# Patient Record
Sex: Female | Born: 1950
Health system: Southern US, Community
[De-identification: ages and names within clinical notes are randomized; demographics above are authoritative.]

## PROBLEM LIST (undated history)

## (undated) ENCOUNTER — Inpatient Hospital Stay (HOSPITAL_COMMUNITY): Payer: 59

## (undated) DIAGNOSIS — N762 Acute vulvitis: Secondary | ICD-10-CM

## (undated) DIAGNOSIS — E039 Hypothyroidism, unspecified: Secondary | ICD-10-CM

## (undated) DIAGNOSIS — G4733 Obstructive sleep apnea (adult) (pediatric): Secondary | ICD-10-CM

## (undated) DIAGNOSIS — R7989 Other specified abnormal findings of blood chemistry: Secondary | ICD-10-CM

## (undated) DIAGNOSIS — K635 Polyp of colon: Secondary | ICD-10-CM

## (undated) DIAGNOSIS — C439 Malignant melanoma of skin, unspecified: Secondary | ICD-10-CM

## (undated) DIAGNOSIS — R011 Cardiac murmur, unspecified: Secondary | ICD-10-CM

## (undated) DIAGNOSIS — D72829 Elevated white blood cell count, unspecified: Secondary | ICD-10-CM

## (undated) DIAGNOSIS — I10 Essential (primary) hypertension: Secondary | ICD-10-CM

## (undated) DIAGNOSIS — R945 Abnormal results of liver function studies: Secondary | ICD-10-CM

## (undated) DIAGNOSIS — G459 Transient cerebral ischemic attack, unspecified: Secondary | ICD-10-CM

## (undated) DIAGNOSIS — D539 Nutritional anemia, unspecified: Secondary | ICD-10-CM

## (undated) DIAGNOSIS — E7800 Pure hypercholesterolemia, unspecified: Secondary | ICD-10-CM

## (undated) DIAGNOSIS — K219 Gastro-esophageal reflux disease without esophagitis: Secondary | ICD-10-CM

## (undated) HISTORY — DX: Hypothyroidism, unspecified: E03.9

## (undated) HISTORY — PX: COLONOSCOPY: SHX174

## (undated) HISTORY — DX: Gastro-esophageal reflux disease without esophagitis: K21.9

## (undated) HISTORY — PX: OTHER SURGICAL HISTORY: SHX169

## (undated) HISTORY — DX: Essential (primary) hypertension: I10

## (undated) HISTORY — PX: MELANOMA EXCISION: SHX5266

## (undated) HISTORY — DX: Pure hypercholesterolemia, unspecified: E78.00

## (undated) HISTORY — DX: Elevated white blood cell count, unspecified: D72.829

## (undated) HISTORY — DX: Transient cerebral ischemic attack, unspecified: G45.9

## (undated) HISTORY — DX: Malignant melanoma of skin, unspecified: C43.9

## (undated) HISTORY — DX: Abnormal results of liver function studies: R94.5

## (undated) HISTORY — DX: Other specified abnormal findings of blood chemistry: R79.89

## (undated) HISTORY — PX: HEMORRHOID SURGERY: SHX153

## (undated) HISTORY — DX: Polyp of colon: K63.5

## (undated) HISTORY — DX: Acute vulvitis: N76.2

## (undated) HISTORY — DX: Nutritional anemia, unspecified: D53.9

## (undated) HISTORY — PX: BREAST SURGERY: SHX581

## (undated) HISTORY — DX: Obstructive sleep apnea (adult) (pediatric): G47.33

---

## 1997-12-21 ENCOUNTER — Ambulatory Visit (HOSPITAL_BASED_OUTPATIENT_CLINIC_OR_DEPARTMENT_OTHER): Admission: RE | Admit: 1997-12-21 | Discharge: 1997-12-21 | Payer: Self-pay | Admitting: General Surgery

## 1998-03-09 ENCOUNTER — Emergency Department (HOSPITAL_COMMUNITY): Admission: EM | Admit: 1998-03-09 | Discharge: 1998-03-09 | Payer: Self-pay | Admitting: Emergency Medicine

## 2000-01-19 ENCOUNTER — Ambulatory Visit: Admission: RE | Admit: 2000-01-19 | Discharge: 2000-01-19 | Payer: Self-pay | Admitting: Family Medicine

## 2000-01-19 ENCOUNTER — Encounter: Payer: Self-pay | Admitting: Family Medicine

## 2000-11-30 ENCOUNTER — Ambulatory Visit (HOSPITAL_COMMUNITY): Admission: RE | Admit: 2000-11-30 | Discharge: 2000-11-30 | Payer: Self-pay | Admitting: Family Medicine

## 2000-11-30 ENCOUNTER — Encounter: Payer: Self-pay | Admitting: Family Medicine

## 2002-02-26 ENCOUNTER — Other Ambulatory Visit: Admission: RE | Admit: 2002-02-26 | Discharge: 2002-02-26 | Payer: Self-pay | Admitting: *Deleted

## 2002-06-07 ENCOUNTER — Emergency Department (HOSPITAL_COMMUNITY): Admission: EM | Admit: 2002-06-07 | Discharge: 2002-06-07 | Payer: Self-pay | Admitting: Emergency Medicine

## 2002-12-23 ENCOUNTER — Encounter: Payer: Self-pay | Admitting: Emergency Medicine

## 2002-12-23 ENCOUNTER — Emergency Department (HOSPITAL_COMMUNITY): Admission: EM | Admit: 2002-12-23 | Discharge: 2002-12-23 | Payer: Self-pay | Admitting: Emergency Medicine

## 2003-12-22 ENCOUNTER — Ambulatory Visit (HOSPITAL_COMMUNITY): Admission: RE | Admit: 2003-12-22 | Discharge: 2003-12-22 | Payer: Self-pay | Admitting: Family Medicine

## 2004-12-26 ENCOUNTER — Ambulatory Visit (HOSPITAL_COMMUNITY): Admission: RE | Admit: 2004-12-26 | Discharge: 2004-12-26 | Payer: Self-pay | Admitting: Family Medicine

## 2004-12-26 ENCOUNTER — Other Ambulatory Visit: Admission: RE | Admit: 2004-12-26 | Discharge: 2004-12-26 | Payer: Self-pay | Admitting: Family Medicine

## 2005-01-02 ENCOUNTER — Encounter: Admission: RE | Admit: 2005-01-02 | Discharge: 2005-04-02 | Payer: Self-pay | Admitting: Family Medicine

## 2005-03-09 ENCOUNTER — Ambulatory Visit (HOSPITAL_COMMUNITY): Admission: RE | Admit: 2005-03-09 | Discharge: 2005-03-09 | Payer: Self-pay | Admitting: Family Medicine

## 2005-03-14 ENCOUNTER — Encounter: Admission: RE | Admit: 2005-03-14 | Discharge: 2005-03-14 | Payer: Self-pay | Admitting: Family Medicine

## 2005-08-07 ENCOUNTER — Ambulatory Visit (HOSPITAL_BASED_OUTPATIENT_CLINIC_OR_DEPARTMENT_OTHER): Admission: RE | Admit: 2005-08-07 | Discharge: 2005-08-07 | Payer: Self-pay | Admitting: Family Medicine

## 2005-08-13 ENCOUNTER — Ambulatory Visit: Payer: Self-pay | Admitting: Internal Medicine

## 2005-08-13 ENCOUNTER — Emergency Department (HOSPITAL_COMMUNITY): Admission: EM | Admit: 2005-08-13 | Discharge: 2005-08-14 | Payer: Self-pay | Admitting: Emergency Medicine

## 2006-07-23 ENCOUNTER — Ambulatory Visit (HOSPITAL_COMMUNITY): Admission: RE | Admit: 2006-07-23 | Discharge: 2006-07-23 | Payer: Self-pay | Admitting: Family Medicine

## 2006-09-06 ENCOUNTER — Other Ambulatory Visit: Admission: RE | Admit: 2006-09-06 | Discharge: 2006-09-06 | Payer: Self-pay | Admitting: Family Medicine

## 2006-11-12 ENCOUNTER — Emergency Department (HOSPITAL_COMMUNITY): Admission: EM | Admit: 2006-11-12 | Discharge: 2006-11-12 | Payer: Self-pay | Admitting: Emergency Medicine

## 2007-05-20 DIAGNOSIS — G459 Transient cerebral ischemic attack, unspecified: Secondary | ICD-10-CM

## 2007-05-20 HISTORY — DX: Transient cerebral ischemic attack, unspecified: G45.9

## 2007-06-10 ENCOUNTER — Encounter: Payer: Self-pay | Admitting: Emergency Medicine

## 2007-06-11 ENCOUNTER — Observation Stay (HOSPITAL_COMMUNITY): Admission: EM | Admit: 2007-06-11 | Discharge: 2007-06-12 | Payer: Self-pay | Admitting: Internal Medicine

## 2007-06-11 ENCOUNTER — Ambulatory Visit: Payer: Self-pay | Admitting: Cardiology

## 2007-06-12 ENCOUNTER — Encounter (INDEPENDENT_AMBULATORY_CARE_PROVIDER_SITE_OTHER): Payer: Self-pay | Admitting: Internal Medicine

## 2007-10-07 ENCOUNTER — Encounter: Admission: RE | Admit: 2007-10-07 | Discharge: 2007-10-07 | Payer: Self-pay | Admitting: Family Medicine

## 2007-11-17 HISTORY — PX: DILATION AND CURETTAGE OF UTERUS: SHX78

## 2007-11-28 ENCOUNTER — Ambulatory Visit (HOSPITAL_COMMUNITY): Admission: RE | Admit: 2007-11-28 | Discharge: 2007-11-28 | Payer: Self-pay | Admitting: Obstetrics and Gynecology

## 2007-11-28 ENCOUNTER — Encounter (INDEPENDENT_AMBULATORY_CARE_PROVIDER_SITE_OTHER): Payer: Self-pay | Admitting: Obstetrics and Gynecology

## 2008-01-14 ENCOUNTER — Emergency Department (HOSPITAL_COMMUNITY): Admission: EM | Admit: 2008-01-14 | Discharge: 2008-01-14 | Payer: Self-pay | Admitting: Emergency Medicine

## 2008-12-03 ENCOUNTER — Ambulatory Visit: Payer: Self-pay | Admitting: Gynecology

## 2008-12-03 ENCOUNTER — Encounter: Payer: Self-pay | Admitting: Gynecology

## 2008-12-03 ENCOUNTER — Other Ambulatory Visit: Admission: RE | Admit: 2008-12-03 | Discharge: 2008-12-03 | Payer: Self-pay | Admitting: Gynecology

## 2009-01-04 ENCOUNTER — Ambulatory Visit: Payer: Self-pay | Admitting: Gynecology

## 2009-01-05 ENCOUNTER — Ambulatory Visit: Payer: Self-pay | Admitting: Gynecology

## 2009-06-16 ENCOUNTER — Encounter: Admission: RE | Admit: 2009-06-16 | Discharge: 2009-09-14 | Payer: Self-pay | Admitting: Family Medicine

## 2009-06-23 ENCOUNTER — Ambulatory Visit: Payer: Self-pay | Admitting: Family Medicine

## 2009-09-24 ENCOUNTER — Encounter: Admission: RE | Admit: 2009-09-24 | Discharge: 2009-09-24 | Payer: Self-pay | Admitting: Family Medicine

## 2010-01-27 ENCOUNTER — Ambulatory Visit (HOSPITAL_COMMUNITY)
Admission: RE | Admit: 2010-01-27 | Discharge: 2010-01-27 | Payer: Self-pay | Source: Home / Self Care | Admitting: Orthopaedic Surgery

## 2010-03-09 ENCOUNTER — Ambulatory Visit (HOSPITAL_BASED_OUTPATIENT_CLINIC_OR_DEPARTMENT_OTHER): Admission: RE | Admit: 2010-03-09 | Discharge: 2010-03-09 | Payer: Self-pay | Admitting: Orthopaedic Surgery

## 2010-03-12 ENCOUNTER — Ambulatory Visit: Payer: Self-pay | Admitting: Internal Medicine

## 2010-05-18 ENCOUNTER — Ambulatory Visit (HOSPITAL_BASED_OUTPATIENT_CLINIC_OR_DEPARTMENT_OTHER): Admission: RE | Admit: 2010-05-18 | Discharge: 2010-05-18 | Payer: Self-pay | Admitting: Orthopaedic Surgery

## 2010-07-13 ENCOUNTER — Ambulatory Visit (HOSPITAL_COMMUNITY): Admission: RE | Admit: 2010-07-13 | Discharge: 2010-07-13 | Payer: Self-pay | Admitting: Gynecology

## 2010-07-13 ENCOUNTER — Ambulatory Visit: Payer: Self-pay | Admitting: Gynecology

## 2010-07-13 ENCOUNTER — Other Ambulatory Visit: Admission: RE | Admit: 2010-07-13 | Discharge: 2010-07-13 | Payer: Self-pay | Admitting: Gynecology

## 2010-07-25 ENCOUNTER — Encounter
Admission: RE | Admit: 2010-07-25 | Discharge: 2010-10-18 | Payer: Self-pay | Source: Home / Self Care | Attending: Family Medicine | Admitting: Family Medicine

## 2010-09-06 ENCOUNTER — Ambulatory Visit: Payer: Self-pay | Admitting: Gynecology

## 2010-10-09 ENCOUNTER — Encounter: Payer: Self-pay | Admitting: Orthopaedic Surgery

## 2010-10-09 ENCOUNTER — Encounter: Payer: Self-pay | Admitting: Orthopedic Surgery

## 2010-10-20 ENCOUNTER — Ambulatory Visit (HOSPITAL_COMMUNITY)
Admission: RE | Admit: 2010-10-20 | Discharge: 2010-10-20 | Disposition: A | Discharge status: Home / Self Care | Payer: 59 | Source: Ambulatory Visit | Attending: Emergency Medicine | Admitting: Emergency Medicine

## 2010-10-20 ENCOUNTER — Other Ambulatory Visit (HOSPITAL_COMMUNITY): Payer: Self-pay | Admitting: Emergency Medicine

## 2010-10-20 ENCOUNTER — Inpatient Hospital Stay (INDEPENDENT_AMBULATORY_CARE_PROVIDER_SITE_OTHER)
Admission: RE | Admit: 2010-10-20 | Discharge: 2010-10-20 | Disposition: A | Discharge status: Home / Self Care | Payer: 59 | Source: Ambulatory Visit | Attending: Emergency Medicine | Admitting: Emergency Medicine

## 2010-10-20 DIAGNOSIS — M549 Dorsalgia, unspecified: Secondary | ICD-10-CM | POA: Insufficient documentation

## 2010-10-20 DIAGNOSIS — W19XXXA Unspecified fall, initial encounter: Secondary | ICD-10-CM

## 2010-10-20 DIAGNOSIS — Z9181 History of falling: Secondary | ICD-10-CM | POA: Insufficient documentation

## 2010-10-20 DIAGNOSIS — S20229A Contusion of unspecified back wall of thorax, initial encounter: Secondary | ICD-10-CM

## 2010-10-25 ENCOUNTER — Emergency Department (HOSPITAL_COMMUNITY): Payer: 59

## 2010-10-25 ENCOUNTER — Emergency Department (HOSPITAL_COMMUNITY)
Admission: EM | Admit: 2010-10-25 | Discharge: 2010-10-25 | Disposition: A | Discharge status: Home / Self Care | Payer: 59 | Attending: Emergency Medicine | Admitting: Emergency Medicine

## 2010-10-25 DIAGNOSIS — Z8673 Personal history of transient ischemic attack (TIA), and cerebral infarction without residual deficits: Secondary | ICD-10-CM | POA: Insufficient documentation

## 2010-10-25 DIAGNOSIS — X58XXXA Exposure to other specified factors, initial encounter: Secondary | ICD-10-CM | POA: Insufficient documentation

## 2010-10-25 DIAGNOSIS — T148XXA Other injury of unspecified body region, initial encounter: Secondary | ICD-10-CM | POA: Insufficient documentation

## 2010-10-25 DIAGNOSIS — E119 Type 2 diabetes mellitus without complications: Secondary | ICD-10-CM | POA: Insufficient documentation

## 2010-10-25 DIAGNOSIS — M549 Dorsalgia, unspecified: Secondary | ICD-10-CM | POA: Insufficient documentation

## 2010-10-25 LAB — GLUCOSE, CAPILLARY: Glucose-Capillary: 229 mg/dL — ABNORMAL HIGH (ref 70–99)

## 2010-12-29 ENCOUNTER — Encounter: Payer: 59 | Admitting: Family Medicine

## 2011-01-12 ENCOUNTER — Encounter (INDEPENDENT_AMBULATORY_CARE_PROVIDER_SITE_OTHER): Payer: 59 | Admitting: Family Medicine

## 2011-01-12 DIAGNOSIS — Z79899 Other long term (current) drug therapy: Secondary | ICD-10-CM

## 2011-01-12 DIAGNOSIS — E039 Hypothyroidism, unspecified: Secondary | ICD-10-CM

## 2011-01-12 DIAGNOSIS — E7800 Pure hypercholesterolemia, unspecified: Secondary | ICD-10-CM

## 2011-01-12 DIAGNOSIS — IMO0001 Reserved for inherently not codable concepts without codable children: Secondary | ICD-10-CM
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
## 2011-01-31 NOTE — Op Note (Signed)
[name], [name]               ACCOUNT NO.:  [id number]   MEDICAL RECORD NO.:  [id number]          PATIENT TYPE:  AMB   LOCATION:  SDC                           FACILITY:  [facility]   PHYSICIAN:  Huel Cote, M.D. DATE OF BIRTH:  June 16, 1951   DATE OF PROCEDURE:  11/28/2007  DATE OF DISCHARGE:                               OPERATIVE REPORT   PREOPERATIVE DIAGNOSES:  1. Postmenopausal bleeding.  2. Probable endometrial polyp.   POSTOPERATIVE DIAGNOSES:  1. Postmenopausal bleeding.  2. Probable endometrial polyp.   PROCEDURE:  Hysteroscopy, polypectomy and endometrial biopsy.   SURGEON:  Huel Cote, M.D.   ANESTHESIA:  LMA and 1% lidocaine cervical block.   FINDINGS:  There is an anterior polyp, atrophic appearing endometrium.  Also noted, the cervix was very distal in the vagina and difficult to  visualize which had been noted in the office making endometrial biopsy  in the office quite difficult.   SPECIMEN:  The polyp and endometrial biopsy were sent to pathology.   ESTIMATED BLOOD LOSS:  Minimal.   URINE OUTPUT:  Fifth mL straight cath prior to procedure.   IV FLUIDS:  Thirteen hundred mL LR.   The uterus and uterine cavity were quite anterior and cervical dilation  was somewhat challenging; however, was accomplished with flexible  dilator and putting tenaculum on the posterior lip of the cervix.   PROCEDURE IN DETAIL:  The patient was taken to the operating room where  LMA anesthesia was obtained without difficulty.  She was then prepped  and draped in normal sterile fashion in the dorsal lithotomy position.  The bladder was drained and a long Peterson speculum was placed within.  However, secondary to difficulty visualizing with the vaginal walls  somewhat redundant the long LEEP retractor was utilized with sidewall  retractors and a upper and lower blade as well.  With this in place the  cervix was better visualized and was grasped and injected with  approximately 10 mL of 1% lidocaine plain for a local block.  The cervix  was very distal in the vagina.  However, it was able to be grasped on  the anterior lip and a flexible dilator introduced into the uterine  cavity.  This was passed without problem.  However, it was difficult to  admit a sound or any of the more rigid dilators secondary to a  significant anteverted flexed uterus with a steep incline.  The  posterior lip was then grasped with a tenaculum and this enabled the  uterine body to be straightened out somewhat and then the dilators were  more able to be introduced easily.  This was carried out up to  approximately 21.  The diagnostic small resectoscope was then able to be  introduced into the cervical os and under direct visualization carried  up to the uterine cavity which was still quite at an anterior slope.  With the uterine cavity visualized an anterior polyp was seen on the  lower anterior fundus and the remainder of the endometrium appeared  atrophic and normal.  There were no submucosal fibroids or other  findings.  The flexible hysteroscopic graspers were introduced through  the port and the polyp grasped and pulled down somewhat to the cervical  os.  Small fragments were removed but the majority of the polyp was too  large to be removed with this grasper.  As it was loosened somewhat and  pulled down to the cervical os it was felt that now it could be grasped  with straight polyp forceps.  Therefore the hysteroscope was removed and  polyp forceps introduced into the uterine cavity.  The polyp was grasped  and removed in its entirety and was approximately 1 cm in length and 0.5  cm wide.  This was sent to pathology and the hysteroscope reintroduced  into the uterus.  Everything appeared clear with no significant active  bleeding.  Just a small amount of oozing where the polyp had been  removed.  There were no signs of any uterine perforation or other  trauma.  At  this point the hysteroscope was discontinued.  An attempt  was made to introduce a small curette to remove obtain endometrial  curettings as the patient was not able to be sampled in the office due  to her difficult body habitus.  The curette was difficult introducing in  and therefore it was switched to the flexible endometrial Pipelle which  was easily introduced and in several passes a small amount of tissue  obtained which was expected with an atrophic appearing endometrium.  Therefore the polyp and endometrial biopsy sample were sent together.  The patient was then awakened.  Her cervix was hemostatic from the  tenaculum site and the retractor LEEP speculum was removed from the  vagina.  The patient was awakened and did well and was taken to the  recovery room.      Huel Cote, M.D.  Electronically Signed     KR/MEDQ  D:  11/28/2007  T:  11/29/2007  Job:  [id number]

## 2011-01-31 NOTE — H&P (Signed)
[name], [name]               ACCOUNT NO.:  [id number]   MEDICAL RECORD NO.:  [id number]          PATIENT TYPE:  EMS   LOCATION:  ED                           FACILITY:  [facility]   PHYSICIAN:  Hollice Espy, M.D.DATE OF BIRTH:  01-Dec-1950   DATE OF ADMISSION:  06/10/2007  DATE OF DISCHARGE:                              HISTORY & PHYSICAL   PRIMARY CARE PHYSICIAN:  [name], M.D.   CHIEF COMPLAINT:  Headache and left forearm numbness.   HISTORY OF PRESENT ILLNESS:  The patient is a 60 year old Carson female  with past medical history of obesity, borderline diabetes, hypertension,  and hyperlipidemia who presents to the emergency room after an episode  of a headache with associated forearm numbness.  She says she has been  previously well.  She gets an occasional headache, but never this sudden  onset where she described it as having some blurry vision followed by  severe pain behind her right eye and going down the right side of her  head.  The episode lasted for about several hours.  Soon after the  headache started, she started having some numbness starting from the  left elbow down to her left hand.  These symptoms lasted for a little  over an hour and she became concerned and came into the emergency room.  In the emergency room when she came in, she was noted to have an  elevated blood pressure of 187/106.  Her heart rate was stable.  She  underwent a CT scan of the head which was unremarkable for any type of  bleed or large infarct.  Her blood work including CBC and BMET was done  which was normal and so she underwent an MRI and the Hospitalists were  called for further evaluation.  Her symptoms resolved again within 1-2  hours from their onset and she is completely back to her normal  baseline.  Currently she is doing well.  She denies any headaches,  vision changes, dysphagia, chest pain, palpitations, shortness of  breath, wheeze, cough, abdominal pain,  hematuria, dysuria, constipation,  diarrhea, focal extremity numbness, weakness, or pain.   REVIEW OF SYSTEMS:  Otherwise negative.   PAST MEDICAL HISTORY:  Borderline hypertension of which she is no longer  on any medication for, borderline diabetes of which she is no longer on  any medication for, hyperlipidemia, obesity, previous tobacco abuse,  quit 2 years ago, history of oral cancer, status post resection and  treatment.  Hypothyroidism.   MEDICATIONS:  She is on Simvastatin, Zetia, Synthroid, and Septra.   ALLERGIES:  No known drug allergies.   SOCIAL HISTORY:  She denies any current tobacco, heavy alcohol, or drug  use.   FAMILY HISTORY:  Noncontributory.   PHYSICAL EXAMINATION:  VITAL SIGNS:  Temperature 98.3, heart rate 71,  blood pressure 187/106, now down to 113/52, respirations 20, O2  saturation 98% on room air.  GENERAL:  She is alert and oriented x3 in no acute distress.  HEENT:  Normocephalic and atraumatic.  Her mucous membranes are moist.  She has no carotid bruits.  Cranial nerves II-XII grossly intact.  HEART:  Regular rate and rhythm, S1 and S2.  LUNGS:  Clear to auscultation bilaterally, although somewhat limited  secondary to body habitus.  ABDOMEN:  Soft, obese, nontender, and positive bowel sounds.  EXTREMITIES:  No cyanosis, clubbing, or edema.  NEUROLOGY:  No focal neurological findings.  MUSCULOSKELETAL:  Symmetric and 5/5, upper and lower flexion, extension,  and grip.  She has no evidence of any sensory deficits.  She is able to  do finger-to-nose.  No dysmetria, no cerebellar dysfunction.  Gait is  normal.   LABORATORY DATA:  Sodium 139, potassium 4, chloride 104, bicarb 27, BUN  8, creatinine 0.6, glucose 133.  CBC; Carson count normal at 9.9 with no  shift, H&H 12.5 and 37, MCV 82, platelet count 344.  MRI/MRA is pending.  CT scan of the head is negative.   ASSESSMENT:  1. Transient ischemic attack versus possible migraine with aura.       Although this could be migraine especially with the history of      symptoms.  Certainly, she has several risk factors including      diabetes and hypertension which may or may not need to be regulated      on medication.  I plan to keep the patient overnight for      observation, check a bilateral carotid ultrasound to await MRI      results as well as checking a fasting lipid profile and      homocysteine level.  If her ultrasound or MRI is positive, we will      start her on a daily aspirin.  2. Hypothyroidism.  Continue Synthroid.  3. Diabetes mellitus.  Continue to follow her sugars.  4. Hypertension.  If she remains hypertensive during her      hospitalization, we will consider possible recommendation for      starting as an outpatient on antihypertensives.      Hollice Espy, M.D.  Electronically Signed     SKK/MEDQ  D:  06/10/2007  T:  06/10/2007  Job:  [id number]   cc:   [name], M.D.  Fax: [phone]

## 2011-02-03 NOTE — Procedures (Signed)
NAMEHULDA, [name]               ACCOUNT NO.:  [id number]   MEDICAL RECORD NO.:  [id number]          PATIENT TYPE:  OUT   LOCATION:  SLEEP CENTER                 [facility]ACILITY[facility]  [facility]   PHYSICIAN:  Clinton D. Maple Hudson, M.D. DATE OF BIRTH:  08/19/1951   DATE OF STUDY:  08/07/2005                              NOCTURNAL POLYSOMNOGRAM   REFERRING PHYSICIAN:  Dr. Laurann Montana.   INDICATION FOR STUDY:  Hypersomnia with sleep apnea.  Epworth Sleepiness  Score 9/24.  BMI 41.2.  Weight 248 pounds.   SLEEP ARCHITECTURE:  Short total sleep time 288 minutes with sleep  efficiency 63%. Stage 1 was 8%; stage 2 72%; stages 3 and 4, 3%; REM 18% of  total sleep time.  Sleep latency 66 minutes, REM latency 103 minutes, awake  after sleep onset 102 minutes, arousal index 32.   RESPIRATORY DATA:  Split-study protocol.  Apnea-hypopnea index (AHI, RDI)  49.4 obstructive events per hour, indicating moderately severe obstructive  sleep apnea/hypopnea syndrome before CPAP.  This included 34 obstructive  apneas, two central apneas, three mixed apneas, and 78 hypopneas before  CPAP.  The events were not positional.  REM AHI 78.8 per hour.  CPAP was  titrated to 16 CWP, AHI 5.9 per hour.  A medium Respironics Comfort Lite #2  nasal mask was used with a heated humidifier and chin strap.   OXYGEN DATA:  Moderate to loud snoring with oxygen desaturation to a nadir  of 74% before CPAP. After CPAP control, saturation held 94-98% on room air.   CARDIAC DATA:  Normal sinus rhythm.   MOVEMENT/PARASOMNIA:  Occasional leg jerks but with little effect on sleep.  Bathroom times one.   IMPRESSION/RECOMMENDATIONS:  1.  Moderately severe obstructive sleep apnea/hypopnea syndrome, AHI 49.4      per hour with moderate to loud snoring and oxygen desaturation to 74%.      Events were not positional but were more common in REM.  2.  CPAP was titrated to 16 CWP, AHI 5.9 per hour.  A medium Respironics      Comfort Lite #2 nasal  mask was used with heated humidifier.  The      technician added a chin strap during titration to prevent oral venting.      Clinton D. Maple Hudson, M.D.  Diplomate, Biomedical engineer of Sleep Medicine  Electronically Signed    CDY/MEDQ  D:  08/13/2005 10:04:36  T:  08/13/2005 22:46:38  Job:  [id number]

## 2011-02-23 ENCOUNTER — Encounter: Payer: Self-pay | Admitting: *Deleted

## 2011-04-26 ENCOUNTER — Ambulatory Visit: Payer: 59 | Admitting: Family Medicine

## 2011-04-26 ENCOUNTER — Other Ambulatory Visit: Payer: 59

## 2011-04-28 ENCOUNTER — Ambulatory Visit (HOSPITAL_COMMUNITY)
Admission: RE | Admit: 2011-04-28 | Discharge: 2011-04-28 | Disposition: A | Discharge status: Home / Self Care | Payer: 59 | Attending: Psychiatry | Admitting: Psychiatry

## 2011-04-28 DIAGNOSIS — F3289 Other specified depressive episodes: Secondary | ICD-10-CM | POA: Insufficient documentation

## 2011-04-28 DIAGNOSIS — F329 Major depressive disorder, single episode, unspecified: Secondary | ICD-10-CM | POA: Insufficient documentation

## 2011-05-01 ENCOUNTER — Encounter: Payer: Self-pay | Admitting: Family Medicine

## 2011-05-01 ENCOUNTER — Ambulatory Visit (INDEPENDENT_AMBULATORY_CARE_PROVIDER_SITE_OTHER): Payer: 59 | Admitting: Family Medicine

## 2011-05-01 VITALS — BP 120/72 | HR 64 | Ht 65.0 in | Wt 219.0 lb

## 2011-05-01 DIAGNOSIS — F3289 Other specified depressive episodes: Secondary | ICD-10-CM | POA: Insufficient documentation

## 2011-05-01 DIAGNOSIS — F329 Major depressive disorder, single episode, unspecified: Secondary | ICD-10-CM | POA: Insufficient documentation

## 2011-05-01 DIAGNOSIS — R300 Dysuria: Secondary | ICD-10-CM

## 2011-05-01 DIAGNOSIS — E7800 Pure hypercholesterolemia, unspecified: Secondary | ICD-10-CM | POA: Insufficient documentation

## 2011-05-01 DIAGNOSIS — E039 Hypothyroidism, unspecified: Secondary | ICD-10-CM | POA: Insufficient documentation

## 2011-05-01 DIAGNOSIS — E119 Type 2 diabetes mellitus without complications: Secondary | ICD-10-CM

## 2011-05-01 DIAGNOSIS — I10 Essential (primary) hypertension: Secondary | ICD-10-CM | POA: Insufficient documentation

## 2011-05-01 LAB — LIPID PANEL
LDL Cholesterol: 104 mg/dL — ABNORMAL HIGH (ref 0–99)
Total CHOL/HDL Ratio: 4.4 Ratio
Triglycerides: 132 mg/dL (ref <150)

## 2011-05-01 LAB — POCT URINALYSIS DIPSTICK
Bilirubin, UA: NEGATIVE
Urobilinogen, UA: NEGATIVE

## 2011-05-01 LAB — HEPATIC FUNCTION PANEL
ALT: 19 U/L (ref 0–35)
AST: 19 U/L (ref 0–37)
Alkaline Phosphatase: 61 U/L (ref 39–117)
Bilirubin, Direct: 0.1 mg/dL (ref 0.0–0.3)
Indirect Bilirubin: 0.3 mg/dL (ref 0.0–0.9)

## 2011-05-01 LAB — TSH: TSH: 4.609 u[IU]/mL — ABNORMAL HIGH (ref 0.350–4.500)

## 2011-05-01 MED ORDER — SYNTHROID 125 MCG PO TABS: 125.0000 ug | ORAL_TABLET | Freq: Every day | ORAL | Status: DC

## 2011-05-01 MED ORDER — SAXAGLIPTIN-METFORMIN ER 2.5-1000 MG PO TB24: 1.0000 | ORAL_TABLET | Freq: Two times a day (BID) | ORAL | Status: DC

## 2011-05-01 MED ORDER — SERTRALINE HCL 50 MG PO TABS: 50.0000 mg | ORAL_TABLET | Freq: Every day | ORAL | Status: DC

## 2011-05-01 NOTE — Patient Instructions (Signed)
Start zoloft at just 1/2 tablet at bedtime for the first week.  After a week, increase to full tablet.  Call to set up regular counseling sessions. Please try and remember to take the Lipitor at bedtime along with the zoloft (sertraline).  If needed, you can always change to taking Lipitor in the morning.
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
## 2011-05-03 ENCOUNTER — Telehealth: Payer: Self-pay | Admitting: *Deleted

## 2011-05-03 NOTE — Telephone Encounter (Signed)
Patient given lab results and mailed her a copy per her request. Patient states she will call back to schedule lab visit for 3 months ~mid-Nov for lipid and TSH.

## 2011-05-04 ENCOUNTER — Other Ambulatory Visit: Payer: Self-pay | Admitting: *Deleted

## 2011-05-04 DIAGNOSIS — E119 Type 2 diabetes mellitus without complications: Secondary | ICD-10-CM

## 2011-05-04 DIAGNOSIS — N390 Urinary tract infection, site not specified: Secondary | ICD-10-CM

## 2011-05-04 MED ORDER — SULFAMETHOXAZOLE-TMP DS 800-160 MG PO TABS: 1.0000 | ORAL_TABLET | Freq: Two times a day (BID) | ORAL | Status: DC

## 2011-05-04 NOTE — Telephone Encounter (Signed)
Spoke with patient, gave her +UTI results and called in Septra DS #14 BID x 7days 0 rf to [employer].

## 2011-05-31 ENCOUNTER — Ambulatory Visit
Admission: RE | Admit: 2011-05-31 | Discharge: 2011-05-31 | Disposition: A | Discharge status: Home / Self Care | Payer: 59 | Source: Ambulatory Visit | Attending: Family Medicine | Admitting: Family Medicine

## 2011-05-31 ENCOUNTER — Encounter: Payer: Self-pay | Admitting: Family Medicine

## 2011-05-31 ENCOUNTER — Ambulatory Visit (INDEPENDENT_AMBULATORY_CARE_PROVIDER_SITE_OTHER): Payer: 59 | Admitting: Family Medicine

## 2011-05-31 VITALS — BP 126/70 | HR 60 | Ht 65.0 in | Wt 222.0 lb

## 2011-05-31 DIAGNOSIS — F329 Major depressive disorder, single episode, unspecified: Secondary | ICD-10-CM

## 2011-05-31 DIAGNOSIS — R1011 Right upper quadrant pain: Secondary | ICD-10-CM

## 2011-05-31 DIAGNOSIS — M549 Dorsalgia, unspecified: Secondary | ICD-10-CM

## 2011-05-31 LAB — POCT URINALYSIS DIPSTICK
Glucose, UA: NEGATIVE
Ketones, UA: NEGATIVE
Spec Grav, UA: 1.025

## 2011-05-31 MED ORDER — SERTRALINE HCL 100 MG PO TABS: 100.0000 mg | ORAL_TABLET | Freq: Every day | ORAL | Status: DC

## 2011-05-31 NOTE — Progress Notes (Signed)
Patient presents to f/u on depression.  Zoloft was started at last visit, and she feels "about the same".  In addition to the stress related to her daughter and her daughter's boyfriend, now has the additional stress potentially losing her full-time job (still has p/t job through [employer] is having financial troubles and may not be able to [employer].  No longer crying every day, but still frequently.  Still having trouble falling and staying asleep, although occasionally has a good night sleep.  Square dancing and kayaking still makes her happy (going this afternoon), but is very depressed when she's home.  Much less frequent thoughts of suicide, no intent to act or plan.  Still contracts for safety.  Intermittently has sharp, excruciating pains R back/flank, sometimes radiates around to her abdomen.  Feels better when she lies down flat.  Not related to eating or bowels.  Pain has been ongoing x 2 weeks, but some days has none.  Urine appears more cloudy, and has some urinary frequency due to her medications, but sometimes having urgency/frequency. No gross hematuria.  Sometimes it is so sore over her lower ribs, it feels like when she broke her ribs.  Denies any trauma.  Denies change in activity, twisting or injury.  Denies heartburn.   Past Medical History  Diagnosis Date  . Diabetes mellitus   . Hypothyroidism   . Hypertension   . Hypercholesteremia   . Vitamin D deficiency   . TIA (transient ischemic attack) 05/2007  . Melanoma 8/06 L cheek(clarks level 2) DrTafeen  . Colon polyps   . Elevated LFTs Hayes    lobular hepatitis and fibrosis on biopsy  . GERD (gastroesophageal reflux disease)   . Sleep apnea, obstructive severe,02/2010    not using CPAP    Past Surgical History  Procedure Date  . Cesarean section x 1; 1987  . Breast surgery benign breast tumor removed 1974  . Melanoma excision L cheek 8/06  . Hemorrhoid surgery external  . Colonoscopy DrHayes  . Dilation  and curettage of uterus 11/2007  . Cardiolite stress normal  11/04 (Dr. Fraser Din)    History   Social History  . Marital Status: Single    Spouse Name: N/A    Number of Children: N/A  . Years of Education: N/A   Occupational History  . Admissions at [employer]   Social History Main Topics  . Smoking status: Current Everyday Smoker -- 0.5 packs/day    Types: Cigarettes  . Smokeless tobacco: Not on file   Comment: quit smoking 2006; recently restarted due to stressors  . Alcohol Use: Yes     1 drink once a year  . Drug Use: No  . Sexually Active: Not on file   Other Topics Concern  . Not on file   Social History Narrative  . No narrative on file    Family History  Problem Relation Age of Onset  . Stroke Mother   . Arthritis Mother     rheumatoid arthritis  . Hyperlipidemia Brother   . Hypertension Brother   . Diabetes Brother     Current outpatient prescriptions:aspirin 81 MG tablet, Take 81 mg by mouth daily.  , Disp: , Rfl: ;  atorvastatin (LIPITOR) 80 MG tablet, Take 80 mg by mouth daily.  , Disp: , Rfl: ;  Cholecalciferol (VITAMIN D) 1000 UNITS capsule, Take 1,000 Units by mouth daily.  , Disp: , Rfl: ;  omega-3 acid ethyl esters (LOVAZA) 1 G  capsule, Take 2 g by mouth 2 (two) times daily.  , Disp: , Rfl:  Saxagliptin-Metformin (KOMBIGLYZE XR) 2.01-999 MG TB24, Take 1 tablet by mouth 2 (two) times daily., Disp: 180 tablet, Rfl: 1;  sertraline (ZOLOFT) 100 MG tablet, Take 1 tablet (100 mg total) by mouth daily. At bedtime, Disp: 30 tablet, Rfl: 1;  SYNTHROID 125 MCG tablet, Take 1 tablet (125 mcg total) by mouth daily., Disp: 90 tablet, Rfl: 1 telmisartan-hydrochlorothiazide (MICARDIS HCT) 80-12.5 MG per tablet, Take 1 tablet by mouth daily.  , Disp: , Rfl: ;  sulfamethoxazole-trimethoprim (BACTRIM DS) 800-160 MG per tablet, Take 1 tablet by mouth 2 (two) times daily., Disp: 14 tablet, Rfl: 0  Allergies  Allergen Reactions  . Ace Inhibitors Cough  .  Acetaminophen Other (See Comments)    Elevated liver enzymes   ROS:  Denies fevers.  See HPI for GI and urinary symptoms.  Denies change in bowel habits, nausea or vomiting.  Denies headaches, chest pain, URI symptoms  PHYSICAL EXAM: BP 126/70  Pulse 60  Ht 5\' 5"  (1.651 m)  Wt 222 lb (100.699 kg)  BMI 36.94 kg/m2 Pleasant, overweight female in no distress Psych:  Mildly depressed mood, but full range of affect, no crying today.  Normal speech, eye contact, hygiene and grooming Heart: regular rate and rhythm Lungs: clear bilaterally Abdomen: Soft, obese, normal bowel sounds. RUQ tenderness.  Borderline hepatomegaly.  No rebound tenderness or guarding R flank pain/CVA tenderness.  Spine nontender Skin: no rash  ASSESSMENT/PLAN: 1. Depressive disorder, not elsewhere classified  sertraline (ZOLOFT) 100 MG tablet  2. Back pain  POCT Urinalysis Dipstick  3. Pain, abdominal, RUQ  Hepatic function panel, US Abdomen Limited RUQ   DDX of RUQ abdominal and back pain discussed--musculoskeletal, kidney stone, gall bladder or liver.  Check u/a, LFT's and RUQ ultrasound  Depression--increase to 100mg  of zoloft.  Strongly encouraged counseling--either through Behavior health, or elsewhere Raynelle Fanning Wh[facility]'s card given)  F/u 4-5 weeks, sooner prn for worsening of abdominal/back complaints

## 2011-05-31 NOTE — Patient Instructions (Signed)
Avoid fried, greasy, fatty foods Call for counseling

## 2011-06-01 ENCOUNTER — Telehealth: Payer: Self-pay | Admitting: *Deleted

## 2011-06-01 LAB — HEPATIC FUNCTION PANEL
AST: 20 U/L (ref 0–37)
Alkaline Phosphatase: 55 U/L (ref 39–117)
Bilirubin, Direct: 0.1 mg/dL (ref 0.0–0.3)
Indirect Bilirubin: 0.2 mg/dL (ref 0.0–0.9)
Total Bilirubin: 0.3 mg/dL (ref 0.3–1.2)

## 2011-06-01 NOTE — Telephone Encounter (Signed)
Pt notifed labs, normal liver enzymes.

## 2011-06-12 LAB — COMPREHENSIVE METABOLIC PANEL
Albumin: 3.9
BUN: 12
Chloride: 98
Creatinine, Ser: 0.62
Total Bilirubin: 1
Total Protein: 7.6

## 2011-06-12 LAB — CBC
HCT: 39.4
MCV: 85.2
Platelets: 340
RDW: 15.7 — ABNORMAL HIGH

## 2011-06-29 ENCOUNTER — Ambulatory Visit: Payer: 59 | Admitting: Family Medicine

## 2011-06-29 LAB — BASIC METABOLIC PANEL
CO2: 27
Calcium: 9.6
Chloride: 104
Creatinine, Ser: 0.6
GFR calc Af Amer: >60
Sodium: 139

## 2011-06-29 LAB — LIPID PANEL
HDL: 29 — ABNORMAL LOW
LDL Cholesterol: 122 — ABNORMAL HIGH
Total CHOL/HDL Ratio: 7
Triglycerides: 256 — ABNORMAL HIGH
VLDL: 51 — ABNORMAL HIGH

## 2011-06-29 LAB — DIFFERENTIAL
Lymphs Abs: 4.4 — ABNORMAL HIGH
Monocytes Absolute: 0.6
Monocytes Relative: 6
Neutro Abs: 4.7
Neutrophils Relative %: 47

## 2011-06-29 LAB — CBC
Hemoglobin: 12.5
MCV: 82.4
RBC: 4.48
WBC: 9.9

## 2011-06-29 LAB — HOMOCYSTEINE: Homocysteine: 9.6

## 2011-11-06 ENCOUNTER — Telehealth: Payer: Self-pay | Admitting: Family Medicine

## 2011-11-06 ENCOUNTER — Other Ambulatory Visit: Payer: Self-pay | Admitting: Family Medicine

## 2011-11-06 NOTE — Telephone Encounter (Signed)
She will need c-met, lipids and TSH.  Check paper chart to see if last microalbumin/Cr was within the year or not. If >1 year, that also needs to be ordered.

## 2011-11-06 NOTE — Telephone Encounter (Signed)
Spoke with patient and let her know rx was called in. Patient is scheduled for med ck 11/29/11 @ 1:45. Other than A1C does she need any other labs? If so she wants to schedule a nurse visit prior to appt. Thanks.

## 2011-11-06 NOTE — Telephone Encounter (Signed)
She was due to f/u on October on meds/moods.  Last A1c was August. She needs to schedule fasting med check (30 min).  Okay to re-send 90 days of synthroid

## 2011-11-06 NOTE — Telephone Encounter (Signed)
Refilled rx of Synthroid.

## 2011-11-08 ENCOUNTER — Other Ambulatory Visit: Payer: Self-pay | Admitting: *Deleted

## 2011-11-08 DIAGNOSIS — I10 Essential (primary) hypertension: Secondary | ICD-10-CM

## 2011-11-08 DIAGNOSIS — E039 Hypothyroidism, unspecified: Secondary | ICD-10-CM

## 2011-11-08 DIAGNOSIS — E119 Type 2 diabetes mellitus without complications: Secondary | ICD-10-CM

## 2011-11-08 DIAGNOSIS — E7800 Pure hypercholesterolemia, unspecified: Secondary | ICD-10-CM

## 2011-11-15 ENCOUNTER — Encounter: Payer: Self-pay | Admitting: Family Medicine

## 2011-11-15 ENCOUNTER — Ambulatory Visit (INDEPENDENT_AMBULATORY_CARE_PROVIDER_SITE_OTHER): Payer: 59 | Admitting: Family Medicine

## 2011-11-15 VITALS — BP 110/70 | HR 68 | Temp 98.4°F | Ht 65.0 in | Wt 224.0 lb

## 2011-11-15 DIAGNOSIS — R52 Pain, unspecified: Secondary | ICD-10-CM

## 2011-11-15 LAB — POCT INFLUENZA A/B: Influenza B, POC: NEGATIVE

## 2011-11-15 NOTE — Progress Notes (Signed)
PATIENT LEFT WITHOUT BEING SEEN  BP 110/70  Pulse 68  Temp(Src) 98.4 F (36.9 C) (Oral)  Ht 5\' 5"  (1.651 m)  Wt 224 lb (101.606 kg)  BMI 37.28 kg/m2  Influenza test negative for A and B

## 2011-11-23 ENCOUNTER — Encounter (HOSPITAL_COMMUNITY): Payer: Self-pay | Admitting: Pharmacy Technician

## 2011-11-23 ENCOUNTER — Encounter (HOSPITAL_COMMUNITY): Payer: Self-pay

## 2011-11-23 ENCOUNTER — Encounter (HOSPITAL_COMMUNITY)
Admission: RE | Admit: 2011-11-23 | Discharge: 2011-11-23 | Disposition: A | Discharge status: Home / Self Care | Payer: 59 | Source: Ambulatory Visit | Attending: Ophthalmology | Admitting: Ophthalmology

## 2011-11-23 ENCOUNTER — Other Ambulatory Visit: Payer: Self-pay

## 2011-11-23 HISTORY — DX: Cardiac murmur, unspecified: R01.1

## 2011-11-23 LAB — BASIC METABOLIC PANEL
CO2: 28 mEq/L (ref 19–32)
Calcium: 9.6 mg/dL (ref 8.4–10.5)
Chloride: 103 mEq/L (ref 96–112)
Sodium: 140 mEq/L (ref 135–145)

## 2011-11-23 LAB — HEMOGLOBIN AND HEMATOCRIT, BLOOD: HCT: 37.5 % (ref 36.0–46.0)

## 2011-11-23 NOTE — Patient Instructions (Addendum)
C20 ALMYRA BIRMAN  11/23/2011   Your procedure is scheduled on:   11/28/2011  Report to [facility] at 0800  AM.  Call this number if you have problems the morning of surgery: [phone]   Remember:   Do not eat food:After Midnight.  May have clear liquids:until Midnight .  Clear liquids include soda, tea, black coffee, apple or grape juice, broth.  Take these medicines the morning of surgery with A SIP OF WATER:  Zoloft,synthroid,micardis  Do not wear jewelry, make-up or nail polish.  Do not wear lotions, powders, or perfumes. You may wear deodorant.  Do not shave 48 hours prior to surgery.  Do not bring valuables to the hospital.  Contacts, dentures or bridgework may not be worn into surgery.  Leave suitcase in the car. After surgery it may be brought to your room.  For patients admitted to the hospital, checkout time is 11:00 AM the day of discharge.   Patients discharged the day of surgery will not be allowed to drive home.  Name and phone number of your driver: family  Special Instructions: N/A   Please read over the following fact sheets that you were given: Pain Booklet, Surgical Site Infection Prevention, Anesthesia Post-op Instructions and Care and Recovery After Surgery ataract A cataract is a clouding of the lens of the eye. When a lens becomes cloudy, vision is reduced based on the degree and nature of the clouding. Many cataracts reduce vision to some degree. Some cataracts make people more near-sighted as they develop. Other cataracts increase glare. Cataracts that are ignored and become worse can sometimes look white. The white color can be seen through the pupil. CAUSES   Aging. However, cataracts may occur at any age, even in newborns.   Certain drugs.   Trauma to the eye.   Certain diseases such as diabetes.   Specific eye diseases such as chronic inflammation inside the eye or a sudden attack of a rare form of glaucoma.   Inherited or acquired medical problems.    SYMPTOMS   Gradual, progressive drop in vision in the affected eye.   Severe, rapid visual loss. This most often happens when trauma is the cause.  DIAGNOSIS  To detect a cataract, an eye doctor examines the lens. Cataracts are best diagnosed with an exam of the eyes with the pupils enlarged (dilated) by drops.  TREATMENT  For an early cataract, vision may improve by using different eyeglasses or stronger lighting. If that does not help your vision, surgery is the only effective treatment. A cataract needs to be surgically removed when vision loss interferes with your everyday activities, such as driving, reading, or watching TV. A cataract may also have to be removed if it prevents examination or treatment of another eye problem. Surgery removes the cloudy lens and usually replaces it with a substitute lens (intraocular lens, IOL).  At a time when both you and your doctor agree, the cataract will be surgically removed. If you have cataracts in both eyes, only one is usually removed at a time. This allows the operated eye to heal and be out of danger from any possible problems after surgery (such as infection or poor wound healing). In rare cases, a cataract may be doing damage to your eye. In these cases, your caregiver may advise surgical removal right away. The vast majority of people who have cataract surgery have better vision afterward. HOME CARE INSTRUCTIONS  If you are not planning surgery, you may be  asked to do the following:  Use different eyeglasses.   Use stronger or brighter lighting.   Ask your eye doctor about reducing your medicine dose or changing medicines if it is thought that a medicine caused your cataract. Changing medicines does not make the cataract go away on its own.   Become familiar with your surroundings. Poor vision can lead to injury. Avoid bumping into things on the affected side. You are at a higher risk for tripping or falling.   Exercise extreme care when  driving or operating machinery.   Wear sunglasses if you are sensitive to bright light or experiencing problems with glare.  SEEK IMMEDIATE MEDICAL CARE IF:   You have a worsening or sudden vision loss.   You notice redness, swelling, or increasing pain in the eye.   You have a fever.  Document Released: 09/04/2005 Document Revised: 08/24/2011 Document Reviewed: 04/28/2011 [facility] Patient Information 2012 [place], [place].PATIENT INSTRUCTIONS POST-ANESTHESIA  IMMEDIATELY FOLLOWING SURGERY:  Do not drive or operate machinery for the first twenty four hours after surgery.  Do not make any important decisions for twenty four hours after surgery or while taking narcotic pain medications or sedatives.  If you develop intractable nausea and vomiting or a severe headache please notify your doctor immediately.  FOLLOW-UP:  Please make an appointment with your surgeon as instructed. You do not need to follow up with anesthesia unless specifically instructed to do so.  WOUND CARE INSTRUCTIONS (if applicable):  Keep a dry clean dressing on the anesthesia/puncture wound site if there is drainage.  Once the wound has quit draining you may leave it open to air.  Generally you should leave the bandage intact for twenty four hours unless there is drainage.  If the epidural site drains for more than 36-48 hours please call the anesthesia department.  QUESTIONS?:  Please feel free to call your physician or the hospital operator if you have any questions, and they will be happy to assist you.     [facility] Anesthesia Department [address] [place] [phone]

## 2011-11-27 ENCOUNTER — Other Ambulatory Visit: Payer: 59

## 2011-11-27 DIAGNOSIS — E039 Hypothyroidism, unspecified: Secondary | ICD-10-CM

## 2011-11-27 DIAGNOSIS — I10 Essential (primary) hypertension: Secondary | ICD-10-CM

## 2011-11-27 DIAGNOSIS — E119 Type 2 diabetes mellitus without complications: Secondary | ICD-10-CM

## 2011-11-27 DIAGNOSIS — E7800 Pure hypercholesterolemia, unspecified: Secondary | ICD-10-CM

## 2011-11-27 MED ORDER — FLURBIPROFEN SODIUM 0.03 % OP SOLN
OPHTHALMIC | Status: AC
  Filled 2011-11-27: qty 2.5

## 2011-11-27 MED ORDER — TETRACAINE HCL 0.5 % OP SOLN
OPHTHALMIC | Status: AC
  Filled 2011-11-27: qty 2

## 2011-11-27 MED ORDER — CYCLOPENTOLATE-PHENYLEPHRINE 0.2-1 % OP SOLN
OPHTHALMIC | Status: AC
  Filled 2011-11-27: qty 2

## 2011-11-28 ENCOUNTER — Encounter (HOSPITAL_COMMUNITY): Payer: Self-pay | Admitting: *Deleted

## 2011-11-28 ENCOUNTER — Encounter (HOSPITAL_COMMUNITY)
Admission: RE | Disposition: A | Discharge status: Home / Self Care | Payer: Self-pay | Source: Ambulatory Visit | Attending: Ophthalmology

## 2011-11-28 ENCOUNTER — Encounter (HOSPITAL_COMMUNITY): Payer: Self-pay | Admitting: Anesthesiology

## 2011-11-28 ENCOUNTER — Ambulatory Visit (HOSPITAL_COMMUNITY): Payer: 59 | Admitting: Anesthesiology

## 2011-11-28 ENCOUNTER — Ambulatory Visit (HOSPITAL_COMMUNITY)
Admission: RE | Admit: 2011-11-28 | Discharge: 2011-11-28 | Disposition: A | Discharge status: Home / Self Care | Payer: 59 | Source: Ambulatory Visit | Attending: Ophthalmology | Admitting: Ophthalmology

## 2011-11-28 DIAGNOSIS — E119 Type 2 diabetes mellitus without complications: Secondary | ICD-10-CM | POA: Insufficient documentation

## 2011-11-28 DIAGNOSIS — Z79899 Other long term (current) drug therapy: Secondary | ICD-10-CM | POA: Insufficient documentation

## 2011-11-28 DIAGNOSIS — G4733 Obstructive sleep apnea (adult) (pediatric): Secondary | ICD-10-CM | POA: Insufficient documentation

## 2011-11-28 DIAGNOSIS — Z01812 Encounter for preprocedural laboratory examination: Secondary | ICD-10-CM | POA: Insufficient documentation

## 2011-11-28 DIAGNOSIS — I10 Essential (primary) hypertension: Secondary | ICD-10-CM | POA: Insufficient documentation

## 2011-11-28 DIAGNOSIS — H2510 Age-related nuclear cataract, unspecified eye: Secondary | ICD-10-CM | POA: Insufficient documentation

## 2011-11-28 HISTORY — PX: CATARACT EXTRACTION W/PHACO: SHX586

## 2011-11-28 LAB — COMPREHENSIVE METABOLIC PANEL
Albumin: 4.2 g/dL (ref 3.5–5.2)
Alkaline Phosphatase: 55 U/L (ref 39–117)
Glucose, Bld: 117 mg/dL — ABNORMAL HIGH (ref 70–99)
Potassium: 4.3 mEq/L (ref 3.5–5.3)
Sodium: 140 mEq/L (ref 135–145)
Total Protein: 6.9 g/dL (ref 6.0–8.3)

## 2011-11-28 LAB — LIPID PANEL
LDL Cholesterol: 92 mg/dL (ref 0–99)
Triglycerides: 178 mg/dL — ABNORMAL HIGH (ref <150)

## 2011-11-28 LAB — MICROALBUMIN / CREATININE URINE RATIO: Microalb, Ur: 0.82 mg/dL (ref 0.00–1.89)

## 2011-11-28 LAB — TSH: TSH: 8.699 u[IU]/mL — ABNORMAL HIGH (ref 0.350–4.500)

## 2011-11-28 LAB — HEMOGLOBIN A1C
Hgb A1c MFr Bld: 6.7 % — ABNORMAL HIGH (ref <5.7)
Mean Plasma Glucose: 146 mg/dL — ABNORMAL HIGH (ref <117)

## 2011-11-28 SURGERY — PHACOEMULSIFICATION, CATARACT, WITH IOL INSERTION
Anesthesia: Monitor Anesthesia Care | Site: Eye | Laterality: Left | Wound class: Clean

## 2011-11-28 MED ORDER — FLURBIPROFEN SODIUM 0.03 % OP SOLN
1.0000 [drp] | OPHTHALMIC | Status: AC | PRN
  Administered 2011-11-28 (×3): 1 [drp] via OPHTHALMIC

## 2011-11-28 MED ORDER — MIDAZOLAM HCL 2 MG/2ML IJ SOLN
INTRAMUSCULAR | Status: AC
  Filled 2011-11-28: qty 2

## 2011-11-28 MED ORDER — PHENYLEPHRINE HCL 2.5 % OP SOLN
OPHTHALMIC | Status: AC
  Filled 2011-11-28: qty 2

## 2011-11-28 MED ORDER — LIDOCAINE HCL (PF) 1 % IJ SOLN
INTRAMUSCULAR | Status: AC
  Filled 2011-11-28: qty 2

## 2011-11-28 MED ORDER — BSS IO SOLN
INTRAOCULAR | Status: DC | PRN
  Administered 2011-11-28: 10:00:00

## 2011-11-28 MED ORDER — LACTATED RINGERS IV SOLN
INTRAVENOUS | Status: DC
  Administered 2011-11-28: 09:00:00 via INTRAVENOUS

## 2011-11-28 MED ORDER — MIDAZOLAM HCL 2 MG/2ML IJ SOLN
1.0000 mg | INTRAMUSCULAR | Status: DC | PRN
  Administered 2011-11-28 (×2): 2 mg via INTRAVENOUS

## 2011-11-28 MED ORDER — EPINEPHRINE HCL 1 MG/ML IJ SOLN
INTRAMUSCULAR | Status: AC
  Filled 2011-11-28: qty 1

## 2011-11-28 MED ORDER — PROVISC 10 MG/ML IO SOLN
INTRAOCULAR | Status: DC | PRN
  Administered 2011-11-28: 8.5 mg via INTRAOCULAR

## 2011-11-28 MED ORDER — BSS IO SOLN
INTRAOCULAR | Status: DC | PRN
  Administered 2011-11-28: 15 mL via INTRAOCULAR

## 2011-11-28 MED ORDER — CYCLOPENTOLATE-PHENYLEPHRINE 0.2-1 % OP SOLN
1.0000 [drp] | OPHTHALMIC | Status: AC
  Administered 2011-11-28 (×3): 1 [drp] via OPHTHALMIC

## 2011-11-28 MED ORDER — FENTANYL CITRATE 0.05 MG/ML IJ SOLN
25.0000 ug | INTRAMUSCULAR | Status: DC | PRN
Start: 2011-11-28 — End: 2011-11-28

## 2011-11-28 MED ORDER — TETRACAINE HCL 0.5 % OP SOLN
1.0000 [drp] | OPHTHALMIC | Status: AC
  Administered 2011-11-28 (×3): 1 [drp] via OPHTHALMIC

## 2011-11-28 MED ORDER — PHENYLEPHRINE HCL 2.5 % OP SOLN
1.0000 [drp] | OPHTHALMIC | Status: AC
  Administered 2011-11-28 (×3): 1 [drp] via OPHTHALMIC

## 2011-11-28 MED ORDER — ONDANSETRON HCL 4 MG/2ML IJ SOLN: 4.0000 mg | Freq: Once | INTRAMUSCULAR | Status: DC | PRN

## 2011-11-28 SURGICAL SUPPLY — 23 items
CAPSULAR TENSION RING-AMO (OPHTHALMIC RELATED) IMPLANT
CLOTH BEACON ORANGE TIMEOUT ST (SAFETY) ×2 IMPLANT
EYE SHIELD UNIVERSAL CLEAR (GAUZE/BANDAGES/DRESSINGS) ×2 IMPLANT
GLOVE BIOGEL M 6.5 STRL (GLOVE) IMPLANT
GLOVE ECLIPSE 6.5 STRL STRAW (GLOVE) IMPLANT
GLOVE ECLIPSE 7.0 STRL STRAW (GLOVE) IMPLANT
GLOVE EXAM NITRILE LRG STRL (GLOVE) IMPLANT
GLOVE EXAM NITRILE MD LF STRL (GLOVE) ×2 IMPLANT
GLOVE SKINSENSE NS SZ6.5 (GLOVE) ×1
GLOVE SKINSENSE STRL SZ6.5 (GLOVE) ×1 IMPLANT
HEALON 5 0.6 ML (INTRAOCULAR LENS) IMPLANT
KIT VITRECTOMY (OPHTHALMIC RELATED) IMPLANT
LENS INTRAOCULAR RESTOR ×2 IMPLANT
PAD ARMBOARD 7.5X6 YLW CONV (MISCELLANEOUS) ×2 IMPLANT
PROC W NO LENS (INTRAOCULAR LENS)
PROC W SPEC LENS (INTRAOCULAR LENS) ×2
PROCESS W NO LENS (INTRAOCULAR LENS) IMPLANT
PROCESS W SPEC LENS (INTRAOCULAR LENS) ×1 IMPLANT
RING MALYGIN (MISCELLANEOUS) IMPLANT
TAPE SURG TRANSPORE 1 IN (GAUZE/BANDAGES/DRESSINGS) ×1 IMPLANT
TAPE SURGICAL TRANSPORE 1 IN (GAUZE/BANDAGES/DRESSINGS) ×1
VISCOELASTIC ADDITIONAL (OPHTHALMIC RELATED) IMPLANT
WATER STERILE IRR 250ML POUR (IV SOLUTION) ×2 IMPLANT

## 2011-11-28 NOTE — Discharge Instructions (Signed)
JANANN BOEVE  11/28/2011           [facility] Instructions [address] [place] [address]      1. Avoid closing eyes tightly. One often closes the eye tightly when laughing, talking, sneezing, coughing or if they feel irritated. At these times, you should be careful not to close your eyes tightly.  2. Instill one drop [place] in operative eye 3 times each day until the bottle is finished. To instill drops in your eye, open it, look up and have someone gently pull the lower lid down and instill a couple of drops inside the lower lid.  3. Do not touch upper lid.  4. Take Advil or Tylenol for pain.  5. You may use either eye for near work, such as reading or sewing and you may watch television.  6. You may have your hair done at the beauty parlor at any time.  7. Wear dark glasses with or without your own glasses if you are in bright light.  8. Call our office at [phone] or [phone] if you have sharp pain in your eye or unusual symptoms.  9. Do not be concerned because vision in the operative eye is not good. It will not be good, no matter how successful the operation, until you get a special lens for it. Your old glasses will not be suited to the new eye that was operated on and you will not be ready for a new lens for about a month.  10. Follow up at the [facility] office.    I have received a copy of the above instructions and will follow them.

## 2011-11-28 NOTE — Anesthesia Postprocedure Evaluation (Signed)
  Anesthesia Post-op Note  Patient: [name]  Procedure(s) Performed: Procedure(s) (LRB): CATARACT EXTRACTION PHACO AND INTRAOCULAR LENS PLACEMENT (IOC) (Left)  Patient Location:  Short Stay  Anesthesia Type: MAC  Level of Consciousness: awake  Airway and Oxygen Therapy: Patient Spontanous Breathing  Post-op Pain: none  Post-op Assessment: Post-op Vital signs reviewed, Patient's Cardiovascular Status Stable, Respiratory Function Stable, Patent Airway, No signs of Nausea or vomiting and Pain level controlled  Post-op Vital Signs: Reviewed and stable  Complications: No apparent anesthesia complications

## 2011-11-28 NOTE — Transfer of Care (Signed)
Immediate Anesthesia Transfer of Care Note  Patient: [name]  Procedure(s) Performed: Procedure(s) (LRB): CATARACT EXTRACTION PHACO AND INTRAOCULAR LENS PLACEMENT (IOC) (Left)  Patient Location: Shortstay  Anesthesia Type: MAC  Level of Consciousness: awake  Airway & Oxygen Therapy: Patient Spontanous Breathing   Post-op Assessment: Report given to PACU RN, Post -op Vital signs reviewed and stable and Patient moving all extremities  Post vital signs: Reviewed and stable  Complications: No apparent anesthesia complications

## 2011-11-28 NOTE — Anesthesia Procedure Notes (Signed)
Procedure Name: MAC Date/Time: 11/28/2011 9:25 AM Performed by: Franco Nones Pre-anesthesia Checklist: Patient identified, Emergency Drugs available, Suction available, Timeout performed and Patient being monitored Patient Re-evaluated:Patient Re-evaluated prior to inductionOxygen Delivery Method: Nasal Cannula

## 2011-11-28 NOTE — Op Note (Signed)
Patient brought to the operating room and prepped and draped in the usual manner.  Lid speculum inserted in left eye.  Stab incision made at the twelve o'clock position.  Provisc instilled in the anterior chamber.   A 2.4 mm. Stab incision was made temporally.  An anterior capsulotomy was done with a bent 25 gauge needle.  The nucleus was hydrodissected.  The Phaco tip was inserted in the anterior chamber and the nucleus was emulsified.  CDE was 6.72.  The cortical material was then removed with the I and A tip.  Posterior capsule was the polished.  The anterior chamber was deepened with Provisc.  A 11.5 Diopter Restor [id number] IOL was then inserted in the capsular bag.  Two 3.93mm PLRI's done at the 62 degree position.  Provisc was then removed with the I and A tip.  The wound was then hydrated.  Patient sent to the Recovery Room in good condition with follow up in my office.

## 2011-11-28 NOTE — Anesthesia Preprocedure Evaluation (Signed)
Anesthesia Evaluation  Patient identified by MRN, date of birth, ID band Patient awake    Reviewed: Allergy & Precautions, H&P , NPO status , Patient's Chart, lab work & pertinent test results  History of Anesthesia Complications Negative for: history of anesthetic complications  Airway Mallampati: III      Dental  (+) Teeth Intact   Pulmonary sleep apnea , Current Smoker,  breath sounds clear to auscultation        Cardiovascular hypertension, Pt. on medications Rhythm:Regular     Neuro/Psych PSYCHIATRIC DISORDERS Anxiety Depression TIA   GI/Hepatic GERD-  Medicated and Controlled,  Endo/Other  Diabetes mellitus-, Well Controlled, Type 2, Oral Hypoglycemic AgentsHypothyroidism   Renal/GU      Musculoskeletal   Abdominal   Peds  Hematology   Anesthesia Other Findings   Reproductive/Obstetrics                           Anesthesia Physical Anesthesia Plan  ASA: III  Anesthesia Plan: MAC   Post-op Pain Management:    Induction: Intravenous  Airway Management Planned: Nasal Cannula  Additional Equipment:   Intra-op Plan:   Post-operative Plan:   Informed Consent: I have reviewed the patients History and Physical, chart, labs and discussed the procedure including the risks, benefits and alternatives for the proposed anesthesia with the patient or authorized representative who has indicated his/her understanding and acceptance.     Plan Discussed with:   Anesthesia Plan Comments:         Anesthesia Quick Evaluation

## 2011-11-28 NOTE — H&P (Signed)
The patient was re examined and there is no change in the patients condition since the original H and P. 

## 2011-11-29 ENCOUNTER — Encounter: Payer: Self-pay | Admitting: Family Medicine

## 2011-11-29 ENCOUNTER — Ambulatory Visit (INDEPENDENT_AMBULATORY_CARE_PROVIDER_SITE_OTHER): Payer: 59 | Admitting: Family Medicine

## 2011-11-29 VITALS — BP 132/78 | HR 72 | Ht 65.0 in | Wt 232.0 lb

## 2011-11-29 DIAGNOSIS — I10 Essential (primary) hypertension: Secondary | ICD-10-CM

## 2011-11-29 DIAGNOSIS — E119 Type 2 diabetes mellitus without complications: Secondary | ICD-10-CM

## 2011-11-29 DIAGNOSIS — E039 Hypothyroidism, unspecified: Secondary | ICD-10-CM

## 2011-11-29 DIAGNOSIS — E7800 Pure hypercholesterolemia, unspecified: Secondary | ICD-10-CM

## 2011-11-29 DIAGNOSIS — F17200 Nicotine dependence, unspecified, uncomplicated: Secondary | ICD-10-CM | POA: Insufficient documentation

## 2011-11-29 MED ORDER — OMEGA-3-ACID ETHYL ESTERS 1 G PO CAPS: 2.0000 g | ORAL_CAPSULE | Freq: Two times a day (BID) | ORAL | Status: DC

## 2011-11-29 MED ORDER — TELMISARTAN-HCTZ 80-25 MG PO TABS: 1.0000 | ORAL_TABLET | Freq: Every morning | ORAL | Status: DC

## 2011-11-29 MED ORDER — ATORVASTATIN CALCIUM 80 MG PO TABS: 80.0000 mg | ORAL_TABLET | Freq: Every day | ORAL | Status: DC

## 2011-11-29 MED ORDER — SAXAGLIPTIN-METFORMIN ER 2.5-1000 MG PO TB24: 1.0000 | ORAL_TABLET | Freq: Two times a day (BID) | ORAL | Status: DC

## 2011-11-29 NOTE — Patient Instructions (Signed)
Continue all of your current medications. Try and QUIT SMOKING!!!! Try and get at least 30 minutes of exercise daily and lose weight  Return in 3 months for labs (nonfasting)--repeat A1c and thyroid test, and follow up with Dr. Lynelle Doctor after labs

## 2011-11-29 NOTE — Progress Notes (Signed)
Patient presents for med check.  Diabetes follow-up:  Blood sugars are not checked elsewhere.  A1c on 2/26 was 6.2 through [facility].  Denies hypoglycemia.  Denies polydipsia and polyuria.  Last eye exam was recent, about 2 weeks ago.  Just had cataract surgery yesterday, planned for other eye next week.  Patient follows a low sugar diet and checks feet regularly without concerns.  Hypothyroidism: admits to occasionally missing doses. Missed over a whole week when she lost the bottle a few weeks ago, restarted it 2/18. Currently denies any symptoms.  Hypertension follow-up:  Blood pressures elsewhere are 130/66.  Denies dizziness, headaches, chest pain.  Denies side effects of medications.  Hyperlipidemia follow-up:  Patient is reportedly following a low-fat, low cholesterol diet, although admits some noncompliance.  Compliant with medications and denies medication side effects  Still having some issues with her daughter--not helping with anything (even with her recent surgery).  She has asked her to move out, but she has nowhere to go. Never went to counseling. She isn't interested in doing this now.  Past Medical History  Diagnosis Date  . Diabetes mellitus   . Hypothyroidism   . Hypertension   . Hypercholesteremia   . Vitamin d deficiency   . TIA (transient ischemic attack) 05/2007  . Melanoma 8/06 L cheek(clarks level 2) DrTafeen  . Colon polyps   . Elevated LFTs Hayes    lobular hepatitis and fibrosis on biopsy  . GERD (gastroesophageal reflux disease)   . Sleep apnea, obstructive severe,02/2010    not using CPAP  . Heart murmur   . TIA (transient ischemic attack) 2009    Past Surgical History  Procedure Date  . Cesarean section x 1; 1987  . Breast surgery benign breast tumor removed 1974  . Melanoma excision L cheek 8/06  . Hemorrhoid surgery external  . Colonoscopy DrHayes  . Dilation and curettage of uterus 11/2007  . Cardiolite stress normal  11/04 (Dr. Fraser Din)     History   Social History  . Marital Status: Single    Spouse Name: N/A    Number of Children: N/A  . Years of Education: N/A   Occupational History  . Admissions at [employer]   Social History Main Topics  . Smoking status: Current Everyday Smoker -- 0.1 packs/day    Types: Cigarettes  . Smokeless tobacco: Not on file   Comment: quit smoking 2006; recently restarted due to stressors  . Alcohol Use: Yes     1 drink once a year  . Drug Use: No  . Sexually Active: Not on file   Other Topics Concern  . Not on file   Social History Narrative  . No narrative on file    Family History  Problem Relation Age of Onset  . Stroke Mother   . Arthritis Mother     rheumatoid arthritis  . Hyperlipidemia Brother   . Hypertension Brother   . Diabetes Brother    Current Outpatient Prescriptions on File Prior to Visit  Medication Sig Dispense Refill  . aspirin 325 MG tablet Take 325 mg by mouth every morning.       . cholecalciferol (VITAMIN D) 1000 UNITS tablet Take 1,000 Units by mouth daily.      Marland Kitchen SYNTHROID 125 MCG tablet TAKE 1 TABLET BY MOUTH ONCE DAILY  90 tablet  1  . DISCONTD: atorvastatin (LIPITOR) 80 MG tablet Take 80 mg by mouth at bedtime.       Marland Kitchen DISCONTD:  omega-3 acid ethyl esters (LOVAZA) 1 G capsule Take 2 g by mouth 2 (two) times daily.        Marland Kitchen DISCONTD: Saxagliptin-Metformin (KOMBIGLYZE XR) 2.01-999 MG TB24 Take 1 tablet by mouth 2 (two) times daily.  180 tablet  1  . DISCONTD: telmisartan-hydrochlorothiazide (MICARDIS HCT) 80-25 MG per tablet Take 1 tablet by mouth every morning.        Allergies  Allergen Reactions  . Ace Inhibitors Cough  . Acetaminophen Other (See Comments)    Elevated liver enzymes   ROS: Occasionally has burning in outer part of foot, comes and goes.  Recent URI, but symptoms completely resolved.  Denies fevers, cough, shortness of breath, chest pain, nausea, vomiting, urinary complaints, skin concerns.  Moods have  been okay, sleeping okay.  Denies headaches, dizziness  PHYSICAL EXAM: BP 132/78  Pulse 72  Ht 5\' 5"  (1.651 m)  Wt 232 lb (105.235 kg)  BMI 38.61 kg/m2 Well developed, obese, pleasant female in no distress.  A little anxious at first, thinking she was going to have to wait a month for second eye surgery, but while in office, was able to reschedule for next week--she was very relieved. Neck: no lymphadenopathy or thyromegaly Heart: 2-3/6 SEM at RUSB and apex. No rubs or gallops Lungs: clear bilaterally Abdomen: soft, nontender, no organomegaly or mass Extremities:  2+ pulse, no CCE. Normal diabetic foot exam:  Thickened callouses at both great toes, L>R.  Normal sensation.  Onychomycosis, most prominent in both great toes. Skin: no rashes or lesions  Lab Results  Component Value Date   CHOL 162 11/27/2011   HDL 34* 11/27/2011   LDLCALC 92 11/27/2011   TRIG 178* 11/27/2011   CHOLHDL 4.8 11/27/2011   Lab Results  Component Value Date   HGBA1C 6.7* 11/27/2011   Lab Results  Component Value Date   TSH 8.699* 11/27/2011     Chemistry      Component Value Date/Time   NA 140 11/27/2011 1118   K 4.3 11/27/2011 1118   CL 104 11/27/2011 1118   CO2 28 11/27/2011 1118   BUN 13 11/27/2011 1118   CREATININE 0.50 11/27/2011 1118   CREATININE 0.60 11/23/2011 0750      Component Value Date/Time   CALCIUM 9.3 11/27/2011 1118   ALKPHOS 55 11/27/2011 1118   AST 18 11/27/2011 1118   ALT 15 11/27/2011 1118   BILITOT 0.3 11/27/2011 1118      ASSESSMENT/PLAN:  1. Essential hypertension, benign  telmisartan-hydrochlorothiazide (MICARDIS HCT) 80-25 MG per tablet  2. Pure hypercholesterolemia  omega-3 acid ethyl esters (LOVAZA) 1 G capsule, atorvastatin (LIPITOR) 80 MG tablet  3. Type II or unspecified type diabetes mellitus without mention of complication, not stated as uncontrolled  Saxagliptin-Metformin (KOMBIGLYZE XR) 2.01-999 MG TB24, Hemoglobin A1c  4. Unspecified hypothyroidism  TSH  5. Tobacco use  disorder     Strongly encouraged tobacco cessation.  Hypothyroid--likely TSH is high due to missing meds.  Repeat TSH and A1c in 3 months  Hyperlipidemia--daily exercise encouraged.  Lowfat diet.  Continue current meds DM--well controlled.  Daily exercise and weight loss encouraged HTN--controlled   F/u 3 month with A1c and TSH prior

## 2011-11-30 ENCOUNTER — Encounter (HOSPITAL_COMMUNITY): Payer: Self-pay | Admitting: Ophthalmology

## 2011-12-21 ENCOUNTER — Encounter (HOSPITAL_COMMUNITY): Payer: 59

## 2011-12-26 ENCOUNTER — Ambulatory Visit (HOSPITAL_COMMUNITY): Admission: RE | Admit: 2011-12-26 | Payer: 59 | Source: Ambulatory Visit | Admitting: Ophthalmology

## 2011-12-26 ENCOUNTER — Encounter (HOSPITAL_COMMUNITY): Admission: RE | Payer: Self-pay | Source: Ambulatory Visit

## 2011-12-26 SURGERY — PHACOEMULSIFICATION, CATARACT, WITH IOL INSERTION
Anesthesia: Monitor Anesthesia Care | Laterality: Right

## 2012-01-11 ENCOUNTER — Encounter: Payer: Self-pay | Admitting: Family Medicine

## 2012-02-26 ENCOUNTER — Other Ambulatory Visit: Payer: 59

## 2012-02-29 ENCOUNTER — Ambulatory Visit: Payer: 59 | Admitting: Family Medicine

## 2012-08-23 ENCOUNTER — Telehealth: Payer: Self-pay | Admitting: Family Medicine

## 2012-08-23 NOTE — Telephone Encounter (Signed)
Let her know that there has been a stomach virus going around. She should return to normal in several days.

## 2012-08-23 NOTE — Telephone Encounter (Signed)
Pt notified and has scheduled and appt with Dr. Lynelle Doctor just in case for Monday

## 2012-08-26 ENCOUNTER — Ambulatory Visit (INDEPENDENT_AMBULATORY_CARE_PROVIDER_SITE_OTHER): Payer: 59 | Admitting: Family Medicine

## 2012-08-26 ENCOUNTER — Encounter: Payer: Self-pay | Admitting: Family Medicine

## 2012-08-26 VITALS — BP 140/74 | HR 60 | Temp 98.1°F | Ht 65.0 in | Wt 222.0 lb

## 2012-08-26 DIAGNOSIS — Z9114 Patient's other noncompliance with medication regimen: Secondary | ICD-10-CM | POA: Insufficient documentation

## 2012-08-26 DIAGNOSIS — F17200 Nicotine dependence, unspecified, uncomplicated: Secondary | ICD-10-CM

## 2012-08-26 DIAGNOSIS — E119 Type 2 diabetes mellitus without complications: Secondary | ICD-10-CM

## 2012-08-26 DIAGNOSIS — I10 Essential (primary) hypertension: Secondary | ICD-10-CM

## 2012-08-26 DIAGNOSIS — K5289 Other specified noninfective gastroenteritis and colitis: Secondary | ICD-10-CM

## 2012-08-26 DIAGNOSIS — Z9119 Patient's noncompliance with other medical treatment and regimen: Secondary | ICD-10-CM

## 2012-08-26 DIAGNOSIS — K529 Noninfective gastroenteritis and colitis, unspecified: Secondary | ICD-10-CM

## 2012-08-26 DIAGNOSIS — E039 Hypothyroidism, unspecified: Secondary | ICD-10-CM

## 2012-08-26 NOTE — Patient Instructions (Addendum)
Start a probiotic such as Librarian, academic. Avoid all dairy for at least a week Eat a bland diet, and advance as tolerated.  Restart your medications. Return here in 2-3 months for a fasting med check  YOU are in charge of YOUR body.  YOU get to decide whether or not you should be taking your medications.

## 2012-08-26 NOTE — Progress Notes (Signed)
Chief Complaint  Patient presents with  . Acute    when patient belches it smells like BM. Was having a yellow diarrhea, now constipated. Patient hasn't taken her meds x 2 weeks.   8 days ago she started feeling bad, with nausea, then started vomiting. She went home sick, and stayed in bed for 2 days, recalls having had some vomiting and diarrhea.  Felt better, went back to work, but then had recurrent diarrhea (4-5 days ago).  Was having very frequent diarrhea, which even woke her up at night.  3 days ago bowels got more solid, and was noted to be white in color.  Denies foul odor, looked milky, but not greasy looking.  Last BM was 3 days ago.  Has been able to eat, although isn't hungry (didn't eat much last week, Mon through [employer]).  Over the weekend she started belching a foul-smelling gas.  She is passing gas from below, also foul-smelling. Denies fevers.  She is getting nauseated by smells from foods, doesn't have much of an appetite, but is trying to eat.  She feels gassy and bloated in her stomach.  Yesterday she had yogurt and a biscuit. Denies abdominal pain (just bloating after eating).  She hasn't taken any of her medications for 2 weeks.  Hasn't checked her sugars.  Denies excessive thirst or polyuria (just trying to stay well hydrated from this illness).  She has no particular reason for stopping meds, blames her boss.  He thinks she shouldn't take so many medications, so apparently she stopped taking them.  Admits to being under a lot of stress--related to boss, related to her daughter who is going to [place] with a married man, etc.  She was due to return for labs and visit in June, but never came.  Last TSH was in March, elevated at 8.699, but had missed some doses prior to check.  Now has been off meds x 2 weeks.  Past Medical History  Diagnosis Date  . Diabetes mellitus   . Hypothyroidism   . Hypertension   . Hypercholesteremia   . Vitamin D deficiency   . TIA (transient ischemic  attack) 05/2007  . Melanoma 8/06 L cheek(clarks level 2) DrTafeen  . Colon polyps   . Elevated LFTs Hayes    lobular hepatitis and fibrosis on biopsy  . GERD (gastroesophageal reflux disease)   . Sleep apnea, obstructive severe,02/2010    not using CPAP  . Heart murmur   . TIA (transient ischemic attack) 2009   Past Surgical History  Procedure Date  . Cesarean section x 1; 1987  . Breast surgery benign breast tumor removed 1974  . Melanoma excision L cheek 8/06  . Hemorrhoid surgery external  . Colonoscopy DrHayes  . Dilation and curettage of uterus 11/2007  . Cardiolite stress normal  11/04 (Dr. Fraser Din)  . Cataract extraction w/phaco 11/28/2011    Procedure: CATARACT EXTRACTION PHACO AND INTRAOCULAR LENS PLACEMENT (IOC);  Surgeon: Loraine Leriche T. Nile Riggs, MD;  Location: [facility];  Service: Ophthalmology;  Laterality: Left;  CDE 6.72   History   Social History  . Marital Status: Single    Spouse Name: N/A    Number of Children: N/A  . Years of Education: N/A   Occupational History  . Admissions at [employer]   Social History Main Topics  . Smoking status: Current Every Day Smoker -- 0.1 packs/day    Types: Cigarettes  . Smokeless tobacco: Not on file  Comment: quit smoking 2006; recently restarted due to stressors  . Alcohol Use: Yes     Comment: 1 drink once a year  . Drug Use: No  . Sexually Active: Not on file   Other Topics Concern  . Not on file   Social History Narrative  . No narrative on file   ROS:  Denies fevers, URI symptoms, cough, shortness of breath, chest pain, headaches, dizziness.  +GI complaints--see HPI.  Denies urinary complaints.  Denies skin rash, bleeding, bruising.  + stress.  See HPI  PHYSICAL EXAM: BP 140/74  Pulse 60  Temp 98.1 F (36.7 C) (Oral)  Ht 5\' 5"  (1.651 m)  Wt 222 lb (100.699 kg)  BMI 36.94 kg/m2 Well developed, pleasant female, in no distress HEENT:  PERRL, conjunctiva clear.  OP clear, mucus membranes  moist Neck: no lymphadenopathy or thyromegaly Heart: regular rate and rhythm without murmur Lungs: clear bilaterally Abdomen: soft, active bowel sounds. Nontender.  No organomegaly or mass Extremities: no edema Skin: no rash Psych: full range of affect, normal hygiene and grooming  ASSESSMENT/PLAN: 1. Acute gastroenteritis    resolving  2. Unspecified hypothyroidism   3. Type II or unspecified type diabetes mellitus without mention of complication, not stated as uncontrolled   4. Tobacco use disorder   5. Essential hypertension, benign   6. Noncompliance with medication regimen     Reassured regarding her GI symptoms--no evidence of obstruction or concern.  Benign exam.  Reviewed signs/symptoms of obstruction, malabsorption.  Recommended BRAT diet, and avoid dairy for a week (this may be contributing to her bloating, relative lactose intolerance).  Return if increasing abdominal pain, recurrent vomiting, absence of stool or passing gas, signs of malabsorption (weight loss, white floaty/greasy stools, etc).  Discussed importance of being compliant with treatment of her DM, HTN, lipids, thyroid, and potential harmful consequences of noncompliance.  She seems willing to restart meds.  Follow up in 2-3 months for med check.  Encouraged her to quit smoking; risks briefly again reviewed.

## 2012-11-11 ENCOUNTER — Other Ambulatory Visit: Payer: Self-pay | Admitting: *Deleted

## 2012-11-11 ENCOUNTER — Other Ambulatory Visit: Payer: 59

## 2012-11-11 ENCOUNTER — Telehealth: Payer: Self-pay | Admitting: Family Medicine

## 2012-11-11 DIAGNOSIS — H02401 Unspecified ptosis of right eyelid: Secondary | ICD-10-CM

## 2012-11-11 DIAGNOSIS — E039 Hypothyroidism, unspecified: Secondary | ICD-10-CM

## 2012-11-11 DIAGNOSIS — E119 Type 2 diabetes mellitus without complications: Secondary | ICD-10-CM

## 2012-11-11 LAB — BASIC METABOLIC PANEL
BUN: 12 mg/dL (ref 6–23)
Chloride: 102 mEq/L (ref 96–112)
Potassium: 3.8 mEq/L (ref 3.5–5.3)
Sodium: 139 mEq/L (ref 135–145)

## 2012-11-11 LAB — TSH: TSH: 11.915 u[IU]/mL — ABNORMAL HIGH (ref 0.350–4.500)

## 2012-11-11 LAB — HEMOGLOBIN A1C: Mean Plasma Glucose: 143 mg/dL — ABNORMAL HIGH (ref <117)

## 2012-11-11 MED ORDER — LORAZEPAM 1 MG PO TABS: ORAL_TABLET | ORAL | Status: DC

## 2012-11-11 NOTE — Telephone Encounter (Signed)
Please call in med.  Advise her to take 1 tablet 20-30 minutes prior to exam.  If no effect,may take an extra half tablet (rx'ing 2 tablets) She is long past due for follow-up.  She has future orders for A1c and TSH from a whle ago--might as well have Alvino Chapel draw those, and schedule a med check for her

## 2012-11-11 NOTE — Telephone Encounter (Signed)
Scheduled for MRI tomorrow @ [facility] 11:45 arrival. Ordered BMET, she will be in today to have labs drawn. Needs something called into [facility], oral sedative. Patient does have a driver. Thanks.

## 2012-11-11 NOTE — Telephone Encounter (Signed)
Dr. Eulah Pont was on call yesterday for Dr. Nile Riggs (her regular ophtho).  He saw her in the office due to R ptosis and exopthalmus.  He called the office today to get some history, and to have [place] arrange MRI of her brain and orbit with contrast.  He reports that pt states she has had trouble with MRI's in past, requiring sedation, plus our office would have to get prior auth, so he is contacting us for assistance in getting her set up for MRI He will be going out of town on Wed, but would like to be called at his office with results.  If obtained after he is out of town, then just to contact Dr. Nile Riggs with results.  Dr. Earl Lagos office number is [phone].  Advised of her medical history, that other than being seen recently for acute illness, she has past due for follow-up, having not been seen for med check in almost a year.

## 2012-11-12 ENCOUNTER — Ambulatory Visit (HOSPITAL_COMMUNITY)
Admission: RE | Admit: 2012-11-12 | Discharge: 2012-11-12 | Disposition: A | Discharge status: Home / Self Care | Payer: 59 | Source: Ambulatory Visit | Attending: Family Medicine | Admitting: Family Medicine

## 2012-11-12 ENCOUNTER — Telehealth: Payer: Self-pay | Admitting: Family Medicine

## 2012-11-12 DIAGNOSIS — I10 Essential (primary) hypertension: Secondary | ICD-10-CM | POA: Insufficient documentation

## 2012-11-12 DIAGNOSIS — H059 Unspecified disorder of orbit: Secondary | ICD-10-CM | POA: Insufficient documentation

## 2012-11-12 DIAGNOSIS — H02401 Unspecified ptosis of right eyelid: Secondary | ICD-10-CM

## 2012-11-12 DIAGNOSIS — H02409 Unspecified ptosis of unspecified eyelid: Secondary | ICD-10-CM | POA: Insufficient documentation

## 2012-11-12 DIAGNOSIS — E119 Type 2 diabetes mellitus without complications: Secondary | ICD-10-CM | POA: Insufficient documentation

## 2012-11-12 MED ORDER — GADOBENATE DIMEGLUMINE 529 MG/ML IV SOLN
20.0000 mL | Freq: Once | INTRAVENOUS | Status: AC | PRN
  Administered 2012-11-12: 20 mL via INTRAVENOUS

## 2012-11-12 NOTE — Telephone Encounter (Signed)
CONSULTED WITH DR Susann Givens. NO NEED TO CHANGE ORDER TO STAT. MRI RESULTS ARE READ SAME DAY AND WILL BE SENT TO DR KNAPPS INBOX. WE JUST NEED TO ENSURE THAT RESULTS GET DIRECTED TO DR CASHWELL  ASAP.

## 2012-11-12 NOTE — Telephone Encounter (Signed)
I spoke with Dr. Eulah Pont, who had been called directly by radiologist with abnormal results on orbital MRI.  Dr. Eulah Pont has arranged for her to see Dr. Ophelia Charter in [place] (orbital surgeon) on Thursday, but was having difficulty reaching the patient to give her the info.  He was given the number listed for Rachael (emergency contact, dtr, who called earlier today regarding this MRI).

## 2012-11-13 ENCOUNTER — Telehealth: Payer: Self-pay | Admitting: Family Medicine

## 2012-11-13 NOTE — Telephone Encounter (Signed)
Spoke with patient and she was well aware of her appt tomorrow with Dr.Yeats in [place] @ 10:00am. She just wanted to make sure that you got the results. She also stated that she has not been taking her thyroid medication and will schedule appointment once she has all of this eye stuff worked out.

## 2012-11-13 NOTE — Telephone Encounter (Signed)
Dr. Eulah Pont was trying to reach her yesterday to discuss her results and let her know about appointment in [place] for Thursday.  Please make sure she is aware of this appointment (in which case she should have been told about results). Dr. Earl Lagos office was trying to reach her yesteday.  I believe that he also let Dr. Nile Riggs know the results (her regular ophtho)  MRI showed an orbital mass, and she was being referred to an orbital specialist in [place].  Also let her know that her thyroid was being inadequately treated (TSH was high).  See if she has been compliant with her medications

## 2012-11-14 ENCOUNTER — Ambulatory Visit (HOSPITAL_COMMUNITY): Payer: 59

## 2012-11-14 ENCOUNTER — Telehealth: Payer: Self-pay | Admitting: Internal Medicine

## 2012-11-14 MED ORDER — SYNTHROID 125 MCG PO TABS: 125.0000 ug | ORAL_TABLET | Freq: Every day | ORAL | Status: DC

## 2012-11-14 NOTE — Telephone Encounter (Signed)
Pt needed a refill on her synthroid and i have sent it to her pharmacy

## 2012-11-21 ENCOUNTER — Telehealth: Payer: Self-pay | Admitting: *Deleted

## 2012-11-21 NOTE — Telephone Encounter (Signed)
Patient called and Dr.Yeats is wanting her to start a steroid pack tonight, she is not currently taking her glucophage, although she does have some at her home. She wants to know if you think it is okay for her to start the steroids as she has heard that they do raise blood sugars. She has an appt scheduled with you for Monday 11/25/12 @ 11:15am. Please advise. Her last A1c was 6.6 in system from 11/11/12.

## 2012-11-21 NOTE — Telephone Encounter (Signed)
Her previous diabetes medication was kombiglyze--is that was she has at home?  Had she been taking that prior to her recent labs--ie how long ago did she stop it? If the A1c was on meds, she should restart her previous DM meds--might need to adjust/increase next week if sugars high.  Her sugars will get quite high while on these high dose steroids.  If she has been off meds for 3 months (and A1c was 6.6 off meds), then it is probably a good idea to restart same meds, but may be able to titrate down to a different medication as the steroids are weaned off.  It is also important to take her thyroid medication (it is clear from her labs that she hasn't been taking it).  We will see her Monday.  Have her check her sugars and bring list to visit.

## 2012-11-21 NOTE — Telephone Encounter (Signed)
Spoke with patient and she verified that she does have kombiglyze at home not glucophage. She has been off this medication x 3 months. She will resume taking today and start steroids as well. She resumed taking her thyroid medication this morning. She will check sugars and bring to visit Monday. She will call if she has any issues between now and Monday.

## 2012-11-25 ENCOUNTER — Telehealth: Payer: Self-pay | Admitting: Family Medicine

## 2012-11-25 ENCOUNTER — Ambulatory Visit (INDEPENDENT_AMBULATORY_CARE_PROVIDER_SITE_OTHER): Payer: 59 | Admitting: Family Medicine

## 2012-11-25 ENCOUNTER — Encounter: Payer: Self-pay | Admitting: Family Medicine

## 2012-11-25 ENCOUNTER — Ambulatory Visit (INDEPENDENT_AMBULATORY_CARE_PROVIDER_SITE_OTHER): Payer: Self-pay | Admitting: Family Medicine

## 2012-11-25 VITALS — BP 157/83 | HR 62 | Ht 65.0 in | Wt 221.0 lb

## 2012-11-25 VITALS — BP 142/70 | HR 60 | Ht 65.0 in | Wt 222.0 lb

## 2012-11-25 DIAGNOSIS — E7800 Pure hypercholesterolemia, unspecified: Secondary | ICD-10-CM

## 2012-11-25 DIAGNOSIS — F17200 Nicotine dependence, unspecified, uncomplicated: Secondary | ICD-10-CM

## 2012-11-25 DIAGNOSIS — Z91148 Patient's other noncompliance with medication regimen for other reason: Secondary | ICD-10-CM

## 2012-11-25 DIAGNOSIS — E119 Type 2 diabetes mellitus without complications: Secondary | ICD-10-CM

## 2012-11-25 DIAGNOSIS — E039 Hypothyroidism, unspecified: Secondary | ICD-10-CM

## 2012-11-25 DIAGNOSIS — I10 Essential (primary) hypertension: Secondary | ICD-10-CM

## 2012-11-25 DIAGNOSIS — Z9114 Patient's other noncompliance with medication regimen: Secondary | ICD-10-CM

## 2012-11-25 DIAGNOSIS — Z9119 Patient's noncompliance with other medical treatment and regimen: Secondary | ICD-10-CM

## 2012-11-25 MED ORDER — GLUCOSE BLOOD VI STRP: ORAL_STRIP | Status: DC

## 2012-11-25 NOTE — Telephone Encounter (Signed)
Spoke with patient and it is XR so she will take her two pills together.

## 2012-11-25 NOTE — Patient Instructions (Addendum)
Confirm that your kombiglyze is the XR.  If so, it is possible to take both tablets together (once daily, 2 tablets), however if this bothers your stomach, it is fine to continue taking it twice daily.  If you are NOT on the XR (plain kombiglyze), then we need to know, so we can change it in the computer so we give you the appropriate medication when refills are needed.  Check sugars twice daily--once fasting in the morning and once 2 hours after dinner (around bedtime).  Restart the Micardis HCT.   If your blood pressures are <110/60 and having dizziness, then cut the pills in half and call me to let me know Restart the Atorvastatin

## 2012-11-25 NOTE — Progress Notes (Signed)
Chief Complaint  Patient presents with  . Follow-up    follow up, Dr. Carilyn Goodpasture put her on steroid and wants to follow up with you.   She had recent episode of R eye swelling, exophthalmos, with abnormal MRI, showing infiltrative extraconal superolateral right orbital mass.  She was originally seen by Dr. Eulah Pont, and referred to [facility] to see Dr. Carilyn Goodpasture.  She repots that needle biopsy was inconclusive.  Seemed to be getting better, but Thursday 3/6 her eye was completely swollen shut, and there was a knot.  They worked her in at Dr. Carilyn Goodpasture' office, and he was able to do a biopsy in the office.  Also had labs done in the office--she brought copies of labs today (done after the biopsy).  CBC--Hg 10.9, Hct 33.2, ESR, CRP and all other labs normal (normal chem x nonfasting glucose)  Per pt, he doesn't believe this is cancerous.  She was put on steroids--60mg  of prednisone, and is expected to be on prednisone for quite a while.  She started back up on her kombiglyze as she was instructed by our office last week, when prednisone was started.  Most recent sugars have been 165, 158, 114, 137 2 hrs after eating, checking just once daily.  Has been limiting her carbs/sugars.  She just got a new monitor, needs strips.  Feeling hyper, otherwise no significant side effects from steroids.  Sleeping well at night (other than the first night, when she took the dose late in evening).  She got "chewed out" for 30 minutes for resident at [facility] about her thyroid and need for compliance.  She restarted her synthroid immediately after seeing ophtho, and started the kombiglyze 3/6.  Hasn't restarted her other medications.  Told to avoid aspirin and fish oil.  BP was higher earlier this morning at [facility].    BP was high last week at Dr. Carilyn Goodpasture' office, but repeat was high 140's/80's.  Quit smoking about 2 weeks ago (had only been smoking about 2/day)--quit when she got the stomach bug.  Past Medical History  Diagnosis  Date  . Diabetes mellitus   . Hypothyroidism   . Hypertension   . Hypercholesteremia   . Vitamin D deficiency   . TIA (transient ischemic attack) 05/2007  . Melanoma 8/06 L cheek(clarks level 2) DrTafeen  . Colon polyps   . Elevated LFTs Hayes    lobular hepatitis and fibrosis on biopsy  . GERD (gastroesophageal reflux disease)   . Sleep apnea, obstructive severe,02/2010    not using CPAP  . Heart murmur   . TIA (transient ischemic attack) 2009   Past Surgical History  Procedure Laterality Date  . Cesarean section  x 1; 1987  . Breast surgery  benign breast tumor removed 1974  . Melanoma excision  L cheek 8/06  . Hemorrhoid surgery  external  . Colonoscopy  DrHayes  . Dilation and curettage of uterus  11/2007  . Cardiolite stress  normal  11/04 (Dr. Fraser Din)  . Cataract extraction w/phaco  11/28/2011    Procedure: CATARACT EXTRACTION PHACO AND INTRAOCULAR LENS PLACEMENT (IOC);  Surgeon: Loraine Leriche T. Nile Riggs, MD;  Location: [facility];  Service: Ophthalmology;  Laterality: Left;  CDE 6.72   History   Social History  . Marital Status: Single    Spouse Name: N/A    Number of Children: N/A  . Years of Education: N/A   Occupational History  . Admissions at [employer]   Social History Main Topics  . Smoking  status: Former Smoker -- 0.10 packs/day    Types: Cigarettes    Quit date: 11/18/2012  . Smokeless tobacco: Not on file     Comment: quit smoking 2006; recently restarted due to stressors; quit again 11/2012  . Alcohol Use: Yes     Comment: 1 drink once a year  . Drug Use: No  . Sexually Active: Not on file   Other Topics Concern  . Not on file   Social History Narrative  . No narrative on file   Meds: Kombiglyze XR 2.01/999, taking it BID Synthroid  Allergies  Allergen Reactions  . Ace Inhibitors Cough  . Acetaminophen Other (See Comments)    Elevated liver enzymes   ROS:  Denies fevers, URI symptoms, chest pain, headache, nausea, vomiting,  diarrhea (recent GI bug, resolved).  Denies bleeding/bruising, skin rash, cough, shortness of breath or other concerns. Moods have been good.  Checks feet regularly, no concerns  PHYSICAL EXAM: BP 128/80  Pulse 60  Ht 5\' 5"  (1.651 m)  Wt 222 lb (100.699 kg)  BMI 36.94 kg/m2 142/70 on repeat by MD, RA HEENT:  PERRL, EOMI, conjunctiva clear.  Minimal yellow discoloration (resolving bruising) around right eye, wound edges well approximated, no erythema.  OP clear Neck: no lymphadenopathy or mass Heart: regular rate and rhythm; 2/6 SEM, loudest at RUSB Lungs: clear bilaterally Abdomen: obese, soft, nontender, no mass Extremities: no edema Neuro: alert and oriented.  Cranial nerves intact, normal gait Psych: normal mood, affect, hygiene and grooming.  ASSESSMENT/PLAN:  Essential hypertension, benign - BP elevated, off meds.  restart Micardis HCT - Plan: Comprehensive metabolic panel  Pure hypercholesterolemia - noncompliant; restart atorvastatin and recheck lipids in 2 months (and lft's) - Plan: Comprehensive metabolic panel, Lipid panel  Unspecified hypothyroidism - has been noncompliant--just restarted meds.  recheck TSH in 2 months - Plan: TSH  Type II or unspecified type diabetes mellitus without mention of complication, not stated as uncontrolled - has been controlled despite noncompliance with meds.  sugars okay so far since restarting kombiglyze, on prednisone 60mg . continue close monitoring - Plan: Comprehensive metabolic panel, glucose blood test strip  Tobacco use disorder - congratulated on her recent cessation  Noncompliance with medication regimen - since her recent scare with ophthalmic tumor, she is now ready to be compliant with her care, per pt  Check sugars twice daily--once fasting in the morning and once 2 hours after dinner (around bedtime). Restart the Micardis HCT.  If your blood pressures are <110/60 and having dizziness, then cut the pills in half and call me to  let me know Restart atorvastatin.  Labs in 2 months  Double check her kombiglyze--if is XR or plain.  She is taking it twice daily.  Technically if is XR, then can be taken both together (unless she has separated doses to help with tolerability of metformin--has had GI issues with metformin in the past).  Follow up in 2 months--recheck TSH, lipids, c-met. Follow up sooner if sugars rising (can call/fax log, or f/u OV)--discussed the possibility of short-term insulin if sugars increase, >200 consistently while on the high dose steroids.  Currently sugars okay on kombiglyze, but will need to continue to monitor closely.

## 2012-11-25 NOTE — Telephone Encounter (Signed)
She was supposed to let us know if it said XR or not.  If it doesn't have XR on it, then she needs to take it BID (and we need to change it in epic, because we have it as the XR in our system).  If it is XR, can take both pills together

## 2012-11-25 NOTE — Telephone Encounter (Signed)
Pt called and states that her bottle states Kombigylze 2.5 mg/1000 mg 2 x day or can she spread them out?

## 2012-11-25 NOTE — Progress Notes (Signed)
Subjective:  Patient presents today for an annual pharmacy DM visit as part of the employer-sponsored Link to Wellness program.   Patient states that she has been out of her medicine for 3 months and hasn't been taking anything. She recently restarted Kombiglyze and Synthroid this past week.   She has an appointment to see Dr. Lynelle Doctor today and patient expects that she will get her meds restarted at that point. Last A1C was 6.6% on 11/11/12. When [place] checked it was 6.2% and that was without any medications. Now that she is on the steroid Dr. Lynelle Doctor is going to have her restart the Kombiglyze.   Patient recently had surgery on her right eye and is currently taking prednisone. Her eye was swollen shut following a viral illness. It was not a hemorrhage but a mass on her right eye. She is being seen by Dr. Louis Meckel with [facility]. She had a needle biopsy that could not determine if it was cancerous or not. This past Thursday her eye was swollen shut again and she had a knot on her upper eyelid. She had an outpatient surgery to biopsy that mass. Dr. Ophelia Charter doesn't think that it is cancer.    Dr. Louis Meckel does not want her to take the fish oil or ASA at this time because of the increased risk of bleeding.   Disease Assessments:  Diabetes:  MD managing Diabetes Joselyn Arrow; Current Diabetes related medical conditions are Eye problems, High blood pressure; checks feet rarely; does not use glucometer; does not take medications as prescribed; checks blood glucose once daily; Type of Diabetes: Type 2; Year of diagnosis 2006; Sees Diabetes provider 1 time a year; Diabetes Education Wyona Almas- 2006; does not take an aspirin a day;   7 day CBG average 145; Highest CBG 165; Lowest CBG 114; hypoglycemia frequency never;   Other Diabetes History: She has been consistently checking her blood sugar once daily since last Thursday 11/21/12. She is checking 2 hours after a meal once daily. I counseled patient to start checking  CBG daily.  Previous to last week she was not checking her blood sugar or taking any of her medications.    Hemoglobin A1c: 11/11/2012  Dilated Eye Exam: 11/21/2012   Vital Signs: 11/25/2012 10:33 AM (EST) Blood Pressure 157 / 83 mm/HgBMI 36.8; Height 5 ft 5 in; Pulse Rate 62 bpm; Weight 221 lbs   Blood Sugar Tests:  Hemoglobin A1c: 6.6     Assessment/Plan:  62 year old female who is here for an annual pharmacy visit. She is one of Janet's patients and recently re-enrolled with the Link to Wellness program after being discharged due to non compliance.   Patient was in a frenzy today. She had very active speech and was not able to sit still in her chair. She is currently taking 60 mg of prednisone and she kept telling me- "I feel really hyper. Just tell me if I am too hyper". I explained that this was a side effect from prednisone and counseled her to take the prednisone as early in the day as possible.  She also stated that the whole situation with her eye was "the wake-up call she needed". Previously she was not taking any medication or actively managing her disease state. She stated that she was not taking medications mainly due to cost. I reviewed her medications and gave her some suggestions on how to reduce cost. The Micardis now has a generic and will be $10 for a 90 day supply.  I told her she likely needed to restart this especially since her blood pressure was elevated today. She states she could not tolerate plain metformin which was one of the reasons she started Kombiglyze. I found a coupon online for Kombiglyze which brings the copay down to $10 a month which would be more affordable. She agreed to restart her medications and remain adherent to taking them.   She had an appointment with Dr. Lynelle Doctor following our Link to Wellness appointment. Apparently her eye doctor called Dr. Lynelle Doctor to explain the situation regarding her eye and Dr. Lynelle Doctor wanted her to come in. I explained that she  might have to be on insulin while she is on prednisone just so she was prepared. Her fasting blood sugars over the past week are elevated- a few readings were above 150.   Patient will follow up with Marylu Lund in April. I will send Dois Davenport a To Do letting her know that she has had her annual pharmacy visit..    Diabetes Mellitus:  Other Goals for Next Visit-  1. Keep your appointment with Dr. Lynelle Doctor today.  2. Start taking your medication again. 3. Get Dr. Lynelle Doctor to give you a new prescription for testing supplies- lancets and test strips (you have the True Result Meter).  4. Start checking your blood sugar first thing in the morning before you eat anything.  5. Take prednisone in the morning with breakfast. 6. Start recording your blood sugars in the My Glucose Buddy App.   Target blood sugars for diabetic <140 fasting and <180 after eating  Next appointment with Marylu Lund is April 11th at 5:15 PM.

## 2012-11-29 ENCOUNTER — Encounter: Payer: Self-pay | Admitting: Family Medicine

## 2012-12-05 ENCOUNTER — Telehealth: Payer: Self-pay | Admitting: Family Medicine

## 2012-12-05 ENCOUNTER — Encounter: Payer: 59 | Admitting: Family Medicine

## 2012-12-05 ENCOUNTER — Encounter: Payer: Self-pay | Admitting: Family Medicine

## 2012-12-05 NOTE — Telephone Encounter (Signed)
It is very important that she continue to take the prednisone as directed by her eye specialist.  I don't know the dose or plan of length of treatment, so I really can't help, other than [place] trying to contact their office and help figure it out for her.  It is important that she follow his directions regarding prednisone prescription.  Please see if you can call their office and see what the issue is (Dr. Carilyn Goodpasture at [facility], ophtho)

## 2012-12-09 NOTE — Telephone Encounter (Signed)
CALLED DR.OFFICE MED WAS CALLED IN ON Friday THE 21ST

## 2012-12-16 ENCOUNTER — Telehealth: Payer: Self-pay | Admitting: *Deleted

## 2012-12-16 NOTE — Telephone Encounter (Signed)
She should always be honest.  If they have records from [place], they likely know that, but fine to let them know.  They will determine what pre-op eval you need (has to do with what type of anesthesia and associated risks)--I will leave up to them.  (if doesn't require general anesthesia, risk is low)

## 2012-12-16 NOTE — Telephone Encounter (Signed)
Patient advised.

## 2012-12-16 NOTE — Telephone Encounter (Signed)
Patient called to let you know that she has a detached retina of the left eye(right eye was the eye that she was seeing Dr.Yeatts for) so this is actually the other eye. She is having surgery this Wednesday, her pre-op is tomorrow @ [facility]. Do you recommend that she tell that about her heart murmur? Also do you think she should have an EKG at the pre-op visit? Thanks.

## 2012-12-17 NOTE — Progress Notes (Signed)
Patient ID: [name], female   DOB: 03/31/1951, 62 y.o.   MRN: [id number] ATTENDING PHYSICIAN NOTE: I have reviewed the chart and agree with the plan as detailed above. Denny Levy MD Pager [phone]

## 2013-01-29 ENCOUNTER — Encounter (HOSPITAL_COMMUNITY): Payer: Self-pay | Admitting: *Deleted

## 2013-01-29 ENCOUNTER — Emergency Department (INDEPENDENT_AMBULATORY_CARE_PROVIDER_SITE_OTHER)
Admission: EM | Admit: 2013-01-29 | Discharge: 2013-01-29 | Disposition: A | Discharge status: Home / Self Care | Payer: 59 | Source: Home / Self Care | Attending: Family Medicine | Admitting: Family Medicine

## 2013-01-29 ENCOUNTER — Emergency Department (INDEPENDENT_AMBULATORY_CARE_PROVIDER_SITE_OTHER): Payer: 59

## 2013-01-29 DIAGNOSIS — S20211A Contusion of right front wall of thorax, initial encounter: Secondary | ICD-10-CM

## 2013-01-29 DIAGNOSIS — S20219A Contusion of unspecified front wall of thorax, initial encounter: Secondary | ICD-10-CM

## 2013-01-29 MED ORDER — HYDROCODONE-ACETAMINOPHEN 5-325 MG PO TABS
2.0000 | ORAL_TABLET | Freq: Once | ORAL | Status: AC
  Administered 2013-01-29: 2 via ORAL

## 2013-01-29 MED ORDER — TRAMADOL HCL 50 MG PO TABS: 50.0000 mg | ORAL_TABLET | Freq: Three times a day (TID) | ORAL | Status: DC | PRN

## 2013-01-29 MED ORDER — HYDROCODONE-ACETAMINOPHEN 5-325 MG PO TABS
ORAL_TABLET | ORAL | Status: AC
  Filled 2013-01-29: qty 2

## 2013-01-29 MED ORDER — ONDANSETRON 4 MG PO TBDP
ORAL_TABLET | ORAL | Status: AC
  Filled 2013-01-29: qty 1

## 2013-01-29 MED ORDER — ONDANSETRON 4 MG PO TBDP
4.0000 mg | ORAL_TABLET | Freq: Once | ORAL | Status: AC
  Administered 2013-01-29: 4 mg via ORAL
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
## 2013-01-30 ENCOUNTER — Other Ambulatory Visit: Payer: 59

## 2013-01-30 DIAGNOSIS — E119 Type 2 diabetes mellitus without complications: Secondary | ICD-10-CM

## 2013-01-30 DIAGNOSIS — E7800 Pure hypercholesterolemia, unspecified: Secondary | ICD-10-CM

## 2013-01-30 DIAGNOSIS — I10 Essential (primary) hypertension: Secondary | ICD-10-CM

## 2013-01-30 DIAGNOSIS — E039 Hypothyroidism, unspecified: Secondary | ICD-10-CM

## 2013-01-30 LAB — LIPID PANEL
Cholesterol: 214 mg/dL — ABNORMAL HIGH (ref 0–200)
HDL: 38 mg/dL — ABNORMAL LOW (ref >39)
Total CHOL/HDL Ratio: 5.6 Ratio
VLDL: 43 mg/dL — ABNORMAL HIGH (ref 0–40)

## 2013-01-30 LAB — COMPREHENSIVE METABOLIC PANEL
AST: 10 U/L (ref 0–37)
BUN: 12 mg/dL (ref 6–23)
Calcium: 9.1 mg/dL (ref 8.4–10.5)
Chloride: 105 mEq/L (ref 96–112)
Creat: 0.61 mg/dL (ref 0.50–1.10)
Total Bilirubin: 0.6 mg/dL (ref 0.3–1.2)

## 2013-02-03 ENCOUNTER — Encounter: Payer: Self-pay | Admitting: Family Medicine

## 2013-02-03 ENCOUNTER — Ambulatory Visit (INDEPENDENT_AMBULATORY_CARE_PROVIDER_SITE_OTHER): Payer: 59 | Admitting: Family Medicine

## 2013-02-03 VITALS — BP 142/90 | HR 76 | Ht 65.0 in | Wt 231.0 lb

## 2013-02-03 DIAGNOSIS — E119 Type 2 diabetes mellitus without complications: Secondary | ICD-10-CM

## 2013-02-03 DIAGNOSIS — E039 Hypothyroidism, unspecified: Secondary | ICD-10-CM

## 2013-02-03 DIAGNOSIS — E7800 Pure hypercholesterolemia, unspecified: Secondary | ICD-10-CM

## 2013-02-03 DIAGNOSIS — I10 Essential (primary) hypertension: Secondary | ICD-10-CM

## 2013-02-03 DIAGNOSIS — F17200 Nicotine dependence, unspecified, uncomplicated: Secondary | ICD-10-CM

## 2013-02-03 DIAGNOSIS — F329 Major depressive disorder, single episode, unspecified: Secondary | ICD-10-CM

## 2013-02-03 MED ORDER — TELMISARTAN-HCTZ 80-25 MG PO TABS: 1.0000 | ORAL_TABLET | Freq: Every morning | ORAL | Status: DC

## 2013-02-03 MED ORDER — ATORVASTATIN CALCIUM 80 MG PO TABS: 80.0000 mg | ORAL_TABLET | Freq: Every day | ORAL | Status: DC

## 2013-02-03 MED ORDER — SYNTHROID 125 MCG PO TABS: 125.0000 ug | ORAL_TABLET | Freq: Every day | ORAL | Status: DC

## 2013-02-03 MED ORDER — SAXAGLIPTIN-METFORMIN ER 2.5-1000 MG PO TB24: 1.0000 | ORAL_TABLET | Freq: Two times a day (BID) | ORAL | Status: DC
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
## 2013-02-03 NOTE — Patient Instructions (Signed)
Please follow up with behavioral health/counseling. Continue Weight Watchers. Continue your current medications. Monitor your blood pressure elsewhere and bring list of BP's to your next visit. You need to cut back on sodium in diet.  Goal BP is <130/80.  You need to cut back on burgers, cheese, mayonnaise, etc in your diet.  Continue lipitor for now--you are on maximum doses.  If your LDL remains >100 then we will need to change to a stronger (more expensive) cholesterol-lowering medication.

## 2013-02-04 ENCOUNTER — Encounter: Payer: Self-pay | Admitting: Family Medicine

## 2013-02-24 ENCOUNTER — Encounter: Payer: 59 | Admitting: Family Medicine

## 2013-03-03 ENCOUNTER — Other Ambulatory Visit: Payer: 59

## 2013-03-06 ENCOUNTER — Encounter: Payer: 59 | Admitting: Family Medicine

## 2013-03-31 ENCOUNTER — Telehealth: Payer: Self-pay | Admitting: Family Medicine

## 2013-03-31 DIAGNOSIS — E039 Hypothyroidism, unspecified: Secondary | ICD-10-CM

## 2013-03-31 MED ORDER — SYNTHROID 125 MCG PO TABS: 125.0000 ug | ORAL_TABLET | Freq: Every day | ORAL | Status: AC

## 2013-03-31 NOTE — Telephone Encounter (Signed)
Done

## 2013-03-31 NOTE — Telephone Encounter (Signed)
The dog ate her Synthroid rx and she needs a refill to [facility] outpatient pharmacy

## 2013-05-05 ENCOUNTER — Other Ambulatory Visit: Payer: 59

## 2013-05-09 ENCOUNTER — Other Ambulatory Visit: Payer: 59

## 2013-05-09 DIAGNOSIS — E7800 Pure hypercholesterolemia, unspecified: Secondary | ICD-10-CM

## 2013-05-09 DIAGNOSIS — E119 Type 2 diabetes mellitus without complications: Secondary | ICD-10-CM

## 2013-05-09 LAB — LIPID PANEL
HDL: 38 mg/dL — ABNORMAL LOW (ref >39)
LDL Cholesterol: 122 mg/dL — ABNORMAL HIGH (ref 0–99)
Triglycerides: 207 mg/dL — ABNORMAL HIGH (ref <150)
VLDL: 41 mg/dL — ABNORMAL HIGH (ref 0–40)

## 2013-05-12 ENCOUNTER — Ambulatory Visit (INDEPENDENT_AMBULATORY_CARE_PROVIDER_SITE_OTHER): Payer: 59 | Admitting: Family Medicine

## 2013-05-12 VITALS — BP 136/70 | HR 80 | Temp 98.1°F | Ht 65.0 in | Wt 235.0 lb

## 2013-05-12 DIAGNOSIS — F329 Major depressive disorder, single episode, unspecified: Secondary | ICD-10-CM

## 2013-05-12 DIAGNOSIS — F17200 Nicotine dependence, unspecified, uncomplicated: Secondary | ICD-10-CM

## 2013-05-12 DIAGNOSIS — Z23 Encounter for immunization: Secondary | ICD-10-CM

## 2013-05-12 DIAGNOSIS — E7800 Pure hypercholesterolemia, unspecified: Secondary | ICD-10-CM

## 2013-05-12 DIAGNOSIS — R300 Dysuria: Secondary | ICD-10-CM

## 2013-05-12 DIAGNOSIS — IMO0001 Reserved for inherently not codable concepts without codable children: Secondary | ICD-10-CM

## 2013-05-12 DIAGNOSIS — E039 Hypothyroidism, unspecified: Secondary | ICD-10-CM

## 2013-05-12 DIAGNOSIS — N390 Urinary tract infection, site not specified: Secondary | ICD-10-CM

## 2013-05-12 DIAGNOSIS — I10 Essential (primary) hypertension: Secondary | ICD-10-CM

## 2013-05-12 LAB — POCT URINALYSIS DIPSTICK
Nitrite, UA: NEGATIVE
Protein, UA: NEGATIVE
Urobilinogen, UA: NEGATIVE
pH, UA: 5

## 2013-05-12 MED ORDER — CIPROFLOXACIN HCL 250 MG PO TABS: 250.0000 mg | ORAL_TABLET | Freq: Two times a day (BID) | ORAL | Status: DC

## 2013-05-12 NOTE — Patient Instructions (Addendum)
Dr. Zenovia Jordan and Dr. Pollyann Savoy are both female rheumatologists here in [place] Please make sure that you have Dr. Carilyn Goodpasture' records to provide to the rheumatologists--they will need them  Try and exercise at least 30 minutes daily. Take the antibiotic for bladder infection. Low cholesterol diet as discussed.  Try and lose the weight that you gained.  Goal LDL is <100--if it remains higher at your next check, we will need to change your cholesterol medication  Goal A1c is <7, preferably <6.5
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
## 2013-05-13 ENCOUNTER — Encounter: Payer: Self-pay | Admitting: Family Medicine

## 2013-08-20 ENCOUNTER — Ambulatory Visit (INDEPENDENT_AMBULATORY_CARE_PROVIDER_SITE_OTHER): Payer: 59 | Admitting: Family Medicine

## 2013-08-20 ENCOUNTER — Encounter: Payer: Self-pay | Admitting: Family Medicine

## 2013-08-20 VITALS — BP 110/70 | HR 84 | Temp 98.4°F | Ht 65.0 in | Wt 233.0 lb

## 2013-08-20 DIAGNOSIS — K5289 Other specified noninfective gastroenteritis and colitis: Secondary | ICD-10-CM

## 2013-08-20 DIAGNOSIS — K529 Noninfective gastroenteritis and colitis, unspecified: Secondary | ICD-10-CM

## 2013-08-20 NOTE — Patient Instructions (Signed)
Viral gastroenteritis Start with clear liquids, advance as tolerated to [employer] (bananas, rice, applesauce and toast), bland diet.  Avoid dairy for 5-7 days. Use Imodium as needed for diarrhea  Return if persistent/worsening symtpoms, fever, bloody stools, worsening abdominal pain or fever.  Viral Gastroenteritis Viral gastroenteritis is also known as stomach flu. This condition affects the stomach and intestinal tract. It can cause sudden diarrhea and vomiting. The illness typically lasts 3 to 8 days. Most people develop an immune response that eventually gets rid of the virus. While this natural response develops, the virus can make you quite ill. CAUSES  Many different viruses can cause gastroenteritis, such as rotavirus or noroviruses. You can catch one of these viruses by consuming contaminated food or water. You may also catch a virus by sharing utensils or other personal items with an infected person or by touching a contaminated surface. SYMPTOMS  The most common symptoms are diarrhea and vomiting. These problems can cause a severe loss of body fluids (dehydration) and a body salt (electrolyte) imbalance. Other symptoms may include:  Fever.  Headache.  Fatigue.  Abdominal pain. DIAGNOSIS  Your caregiver can usually diagnose viral gastroenteritis based on your symptoms and a physical exam. A stool sample may also be taken to test for the presence of viruses or other infections. TREATMENT  This illness typically goes away on its own. Treatments are aimed at rehydration. The most serious cases of viral gastroenteritis involve vomiting so severely that you are not able to keep fluids down. In these cases, fluids must be given through an intravenous line (IV). HOME CARE INSTRUCTIONS   Drink enough fluids to keep your urine clear or pale yellow. Drink small amounts of fluids frequently and increase the amounts as tolerated.  Ask your caregiver for specific rehydration  instructions.  Avoid:  Foods high in sugar.  Alcohol.  Carbonated drinks.  Tobacco.  Juice.  Caffeine drinks.  Extremely hot or cold fluids.  Fatty, greasy foods.  Too much intake of anything at one time.  Dairy products until 24 to 48 hours after diarrhea stops.  You may consume probiotics. Probiotics are active cultures of beneficial bacteria. They may lessen the amount and number of diarrheal stools in adults. Probiotics can be found in yogurt with active cultures and in supplements.  Wash your hands well to avoid spreading the virus.  Only take over-the-counter or prescription medicines for pain, discomfort, or fever as directed by your caregiver. Do not give aspirin to children. Antidiarrheal medicines are not recommended.  Ask your caregiver if you should continue to take your regular prescribed and over-the-counter medicines.  Keep all follow-up appointments as directed by your caregiver. SEEK IMMEDIATE MEDICAL CARE IF:   You are unable to keep fluids down.  You do not urinate at least once every 6 to 8 hours.  You develop shortness of breath.  You notice blood in your stool or vomit. This may look like coffee grounds.  You have abdominal pain that increases or is concentrated in one small area (localized).  You have persistent vomiting or diarrhea.  You have a fever.  The patient is a child younger than 3 months, and he or she has a fever.  The patient is a child older than 3 months, and he or she has a fever and persistent symptoms.  The patient is a child older than 3 months, and he or she has a fever and symptoms suddenly get worse.  The patient is a baby, and  he or she has no tears when crying. MAKE SURE YOU:   Understand these instructions.  Will watch your condition.  Will get help right away if you are not doing well or get worse. Document Released: 09/04/2005 Document Revised: 11/27/2011 Document Reviewed: 06/21/2011 [facility] Patient  Information 2014 [place], [place].
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
## 2013-08-21 ENCOUNTER — Telehealth: Payer: Self-pay | Admitting: *Deleted
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
## 2013-08-21 NOTE — Telephone Encounter (Signed)
Message copied by Melonie Florida on Thu Aug 21, 2013 12:12 PM ------      Message from: KNAPP, EVE      Created: Wed Aug 20, 2013  6:05 PM       After she left I noticed that she was due to f/u here in 3 mos from her last visit--she is past due, and doesn't have OV (med check) scheduled--see last OV ------

## 2014-01-12 ENCOUNTER — Other Ambulatory Visit: Payer: Self-pay

## 2014-01-14 ENCOUNTER — Encounter: Payer: Self-pay | Admitting: Family Medicine

## 2014-01-29 ENCOUNTER — Encounter: Payer: Self-pay | Admitting: Family Medicine

## 2014-02-10 ENCOUNTER — Encounter (HOSPITAL_BASED_OUTPATIENT_CLINIC_OR_DEPARTMENT_OTHER): Payer: Self-pay | Admitting: Emergency Medicine

## 2014-02-10 ENCOUNTER — Emergency Department (HOSPITAL_BASED_OUTPATIENT_CLINIC_OR_DEPARTMENT_OTHER)
Admission: EM | Admit: 2014-02-10 | Discharge: 2014-02-10 | Disposition: A | Discharge status: Home / Self Care | Payer: 59 | Attending: Emergency Medicine | Admitting: Emergency Medicine

## 2014-02-10 DIAGNOSIS — Z8673 Personal history of transient ischemic attack (TIA), and cerebral infarction without residual deficits: Secondary | ICD-10-CM | POA: Insufficient documentation

## 2014-02-10 DIAGNOSIS — S39012A Strain of muscle, fascia and tendon of lower back, initial encounter: Secondary | ICD-10-CM

## 2014-02-10 DIAGNOSIS — G4733 Obstructive sleep apnea (adult) (pediatric): Secondary | ICD-10-CM | POA: Insufficient documentation

## 2014-02-10 DIAGNOSIS — E7800 Pure hypercholesterolemia, unspecified: Secondary | ICD-10-CM | POA: Insufficient documentation

## 2014-02-10 DIAGNOSIS — S335XXA Sprain of ligaments of lumbar spine, initial encounter: Secondary | ICD-10-CM | POA: Insufficient documentation

## 2014-02-10 DIAGNOSIS — Z860100 Personal history of colon polyps, unspecified: Secondary | ICD-10-CM | POA: Insufficient documentation

## 2014-02-10 DIAGNOSIS — Y939 Activity, unspecified: Secondary | ICD-10-CM | POA: Insufficient documentation

## 2014-02-10 DIAGNOSIS — E039 Hypothyroidism, unspecified: Secondary | ICD-10-CM | POA: Insufficient documentation

## 2014-02-10 DIAGNOSIS — Z8679 Personal history of other diseases of the circulatory system: Secondary | ICD-10-CM | POA: Insufficient documentation

## 2014-02-10 DIAGNOSIS — Z79899 Other long term (current) drug therapy: Secondary | ICD-10-CM | POA: Insufficient documentation

## 2014-02-10 DIAGNOSIS — E669 Obesity, unspecified: Secondary | ICD-10-CM | POA: Insufficient documentation

## 2014-02-10 DIAGNOSIS — Z7982 Long term (current) use of aspirin: Secondary | ICD-10-CM | POA: Insufficient documentation

## 2014-02-10 DIAGNOSIS — Y929 Unspecified place or not applicable: Secondary | ICD-10-CM | POA: Insufficient documentation

## 2014-02-10 DIAGNOSIS — Z85820 Personal history of malignant melanoma of skin: Secondary | ICD-10-CM | POA: Insufficient documentation

## 2014-02-10 DIAGNOSIS — I10 Essential (primary) hypertension: Secondary | ICD-10-CM | POA: Insufficient documentation

## 2014-02-10 DIAGNOSIS — R011 Cardiac murmur, unspecified: Secondary | ICD-10-CM | POA: Insufficient documentation

## 2014-02-10 DIAGNOSIS — Z8719 Personal history of other diseases of the digestive system: Secondary | ICD-10-CM | POA: Insufficient documentation

## 2014-02-10 DIAGNOSIS — X58XXXA Exposure to other specified factors, initial encounter: Secondary | ICD-10-CM | POA: Insufficient documentation

## 2014-02-10 DIAGNOSIS — E119 Type 2 diabetes mellitus without complications: Secondary | ICD-10-CM | POA: Insufficient documentation

## 2014-02-10 DIAGNOSIS — F17200 Nicotine dependence, unspecified, uncomplicated: Secondary | ICD-10-CM | POA: Insufficient documentation

## 2014-02-10 DIAGNOSIS — E559 Vitamin D deficiency, unspecified: Secondary | ICD-10-CM | POA: Insufficient documentation

## 2014-02-10 LAB — URINALYSIS, ROUTINE W REFLEX MICROSCOPIC
BILIRUBIN URINE: NEGATIVE
Glucose, UA: 100 mg/dL — AB
Hgb urine dipstick: NEGATIVE
KETONES UR: NEGATIVE mg/dL
LEUKOCYTES UA: NEGATIVE
NITRITE: NEGATIVE
PROTEIN: NEGATIVE mg/dL
Specific Gravity, Urine: 1.011 (ref 1.005–1.030)
Urobilinogen, UA: 1 mg/dL (ref 0.0–1.0)
pH: 6 (ref 5.0–8.0)

## 2014-02-10 MED ORDER — NAPROXEN 500 MG PO TABS: 500.0000 mg | ORAL_TABLET | Freq: Two times a day (BID) | ORAL | Status: DC

## 2014-02-10 MED ORDER — OXYCODONE HCL 5 MG PO TABS: 5.0000 mg | ORAL_TABLET | Freq: Four times a day (QID) | ORAL | Status: DC | PRN

## 2014-02-10 NOTE — ED Notes (Signed)
Left flank pain and recently treated for a UTI.

## 2014-02-10 NOTE — ED Provider Notes (Addendum)
CSN: [id number]     Arrival date & time 02/10/14  1345 History   First MD Initiated Contact with Patient 02/10/14 1519     Chief Complaint  Patient presents with  . Flank Pain     (Consider location/radiation/quality/duration/timing/severity/associated sxs/prior Treatment) Patient is a 63 y.o. female presenting with back pain. The history is provided by the patient.  Back Pain Location:  Lumbar spine Quality:  Aching and shooting Radiates to:  L posterior upper leg Pain severity:  Severe Worse during: worse when got up this morning. Onset quality:  Gradual Duration:  5 days Timing:  Intermittent Progression:  Waxing and waning Chronicity:  New Context: not falling, not lifting heavy objects and not twisting   Context comment:  Recently treated for UTI on friday for blood in urine Relieved by:  Heating pad Worsened by:  Movement (lying down and position change) Associated symptoms: leg pain   Associated symptoms: no abdominal pain, no bladder incontinence, no bowel incontinence, no dysuria, no fever, no numbness and no perianal numbness   Risk factors: obesity   Risk factors comment:  Diabetes   Past Medical History  Diagnosis Date  . Diabetes mellitus   . Hypothyroidism   . Hypertension   . Hypercholesteremia   . Vitamin D deficiency   . TIA (transient ischemic attack) 05/2007  . Melanoma 8/06 L cheek(clarks level 2) DrTafeen  . Colon polyps   . Elevated LFTs Hayes    lobular hepatitis and fibrosis on biopsy  . GERD (gastroesophageal reflux disease)   . Sleep apnea, obstructive severe,02/2010    not using CPAP  . Heart murmur   . TIA (transient ischemic attack) 2009   Past Surgical History  Procedure Laterality Date  . Cesarean section  x 1; 1987  . Breast surgery  benign breast tumor removed 1974  . Melanoma excision  L cheek 8/06  . Hemorrhoid surgery  external  . Colonoscopy  DrHayes  . Dilation and curettage of uterus  11/2007  . Cardiolite stress  normal   11/04 (Dr. Jaci Standard)  . Cataract extraction w/phaco  11/28/2011    Procedure: CATARACT EXTRACTION PHACO AND INTRAOCULAR LENS PLACEMENT (IOC);  Surgeon: Elta Guadeloupe T. Gershon Crane, MD;  Location: [facility];  Service: Ophthalmology;  Laterality: Left;  CDE 6.72   Family History  Problem Relation Age of Onset  . Stroke Mother   . Arthritis Mother     rheumatoid arthritis  . Hyperlipidemia Brother   . Hypertension Brother   . Diabetes Brother    History  Substance Use Topics  . Smoking status: Current Some Day Smoker -- 0.10 packs/day    Types: Cigarettes    Last Attempt to Quit: 11/18/2012  . Smokeless tobacco: Not on file     Comment: quit smoking 2006; recently restarted due to stressors; quit again 11/2012  . Alcohol Use: Yes     Comment: 1 drink once a year   OB History   Grav Para Term Preterm Abortions TAB SAB Ect Mult Living                 Review of Systems  Constitutional: Negative for fever.  Gastrointestinal: Negative for abdominal pain and bowel incontinence.  Genitourinary: Negative for bladder incontinence and dysuria.  Musculoskeletal: Positive for back pain.  Neurological: Negative for numbness.  All other systems reviewed and are negative.     Allergies  Ace inhibitors and Acetaminophen  Home Medications   Prior to Admission medications   Medication Sig  Start Date End Date Taking? Authorizing Provider  aspirin 325 MG tablet Take 325 mg by mouth every morning.     Historical Provider, MD  atorvastatin (LIPITOR) 80 MG tablet Take 1 tablet (80 mg total) by mouth at bedtime. 02/03/13   Rita Ohara, MD  cholecalciferol (VITAMIN D) 1000 UNITS tablet Take 1,000 Units by mouth daily.    Historical Provider, MD  glucose blood test strip Use as instructed 11/25/12   Rita Ohara, MD  ibuprofen (ADVIL,MOTRIN) 200 MG tablet Take 800 mg by mouth daily.    Historical Provider, MD  omega-3 acid ethyl esters (LOVAZA) 1 G capsule Take 2 capsules (2 g total) by mouth 2 (two) times daily.  11/29/11   Rita Ohara, MD  Saxagliptin-Metformin (KOMBIGLYZE XR) 2.01-999 MG TB24 Take 1 tablet by mouth 2 (two) times daily. 02/03/13   Rita Ohara, MD  SYNTHROID 125 MCG tablet Take 1 tablet (125 mcg total) by mouth daily. 03/31/13   Rita Ohara, MD  telmisartan-hydrochlorothiazide (MICARDIS HCT) 80-25 MG per tablet Take 1 tablet by mouth every morning. 02/03/13   Rita Ohara, MD  vitamin C (ASCORBIC ACID) 500 MG tablet Take 500 mg by mouth daily.    Historical Provider, MD   BP 157/76  Pulse 65  Temp(Src) 97.9 F (36.6 C) (Oral)  Resp 18  Wt 233 lb (105.688 kg)  SpO2 99% Physical Exam  Nursing note and vitals reviewed. Constitutional: She is oriented to person, place, and time. She appears well-developed and well-nourished. No distress.  HENT:  Head: Normocephalic and atraumatic.  Mouth/Throat: Oropharynx is clear and moist.  Eyes: EOM are normal. Pupils are equal, round, and reactive to light.  Neck: Normal range of motion. Neck supple. No spinous process tenderness and no muscular tenderness present. No rigidity. Normal range of motion present.  Cardiovascular: Normal rate, regular rhythm, normal heart sounds and intact distal pulses.   No murmur heard. Pulmonary/Chest: Effort normal and breath sounds normal. She has no wheezes. She has no rales.  Abdominal: Soft. She exhibits no distension. There is no tenderness. There is no CVA tenderness.  Musculoskeletal: She exhibits no tenderness.       Lumbar back: She exhibits pain. She exhibits normal range of motion, no bony tenderness, no swelling, no deformity and normal pulse.       Back:  Neurological: She is alert and oriented to person, place, and time. Coordination normal.  Reflex Scores:      Patellar reflexes are 1+ on the right side and 1+ on the left side. Skin: Skin is warm and dry. No rash noted.  Psychiatric: She has a normal mood and affect.    ED Course  Procedures (including critical care time) Labs Review Labs Reviewed    URINALYSIS, ROUTINE W REFLEX MICROSCOPIC - Abnormal; Notable for the following:    Glucose, UA 100 (*)    All other components within normal limits    Imaging Review No results found.   EKG Interpretation None      MDM   Final diagnoses:  Lumbar strain    Pt with gradual onset of back pain suggestive more of radiculopathy opposed to UTI.  She was treated with abx last week and today UA is wnl.  Pt recently started back on DM meds and had baseline labs which were wnl.  Pt has no hx of KS and sx do not sound like kidney stone.  Only intermittent radicular sx and none now just low back pain.  No neurovascular  compromise and no incontinence.  Pt has no infectious sx, hx of CA  or other red flags concerning for pathologic back pain.  Pt is able to ambulate but is painful.  Normal strength and reflexes on exam.  Denies trauma. Will give pt pain control and to return for developement of above sx.     Blanchie Dessert, MD 02/10/14 1538  Blanchie Dessert, MD 02/10/14 1540

## 2014-02-10 NOTE — Discharge Instructions (Signed)
Back Exercises Back exercises help treat and prevent back injuries. The goal of back exercises is to increase the strength of your abdominal and back muscles and the flexibility of your back. These exercises should be started when you no longer have back pain. Back exercises include:  Pelvic Tilt. Lie on your back with your knees bent. Tilt your pelvis until the lower part of your back is against the floor. Hold this position 5 to 10 sec and repeat 5 to 10 times.  Knee to Chest. Pull first 1 knee up against your chest and hold for 20 to 30 seconds, repeat this with the other knee, and then both knees. This may be done with the other leg straight or bent, whichever feels better.  Sit-Ups or Curl-Ups. Bend your knees 90 degrees. Start with tilting your pelvis, and do a partial, slow sit-up, lifting your trunk only 30 to 45 degrees off the floor. Take at least 2 to 3 seconds for each sit-up. Do not do sit-ups with your knees out straight. If partial sit-ups are difficult, simply do the above but with only tightening your abdominal muscles and holding it as directed.  Hip-Lift. Lie on your back with your knees flexed 90 degrees. Push down with your feet and shoulders as you raise your hips a couple inches off the floor; hold for 10 seconds, repeat 5 to 10 times.  Back arches. Lie on your stomach, propping yourself up on bent elbows. Slowly press on your hands, causing an arch in your low back. Repeat 3 to 5 times. Any initial stiffness and discomfort should lessen with repetition over time.  Shoulder-Lifts. Lie face down with arms beside your body. Keep hips and torso pressed to floor as you slowly lift your head and shoulders off the floor. Do not overdo your exercises, especially in the beginning. Exercises may cause you some mild back discomfort which lasts for a few minutes; however, if the pain is more severe, or lasts for more than 15 minutes, do not continue exercises until you see your caregiver.  Improvement with exercise therapy for back problems is slow.  See your caregivers for assistance with developing a proper back exercise program. Document Released: 10/12/2004 Document Revised: 11/27/2011 Document Reviewed: 07/06/2011 [facility] Patient Information 2014 [place].  Back Pain, Adult Back pain is very common. The pain often gets better over time. The cause of back pain is usually not dangerous. Most people can learn to manage their back pain on their own.  HOME CARE   Stay active. Start with short walks on flat ground if you can. Try to walk farther each day.  Do not sit, drive, or stand in one place for more than 30 minutes. Do not stay in bed.  Do not avoid exercise or work. Activity can help your back heal faster.  Be careful when you bend or lift an object. Bend at your knees, keep the object close to you, and do not twist.  Sleep on a firm mattress. Lie on your side, and bend your knees. If you lie on your back, put a pillow under your knees.  Only take medicines as told by your doctor.  Put ice on the injured area.  Put ice in a plastic bag.  Place a towel between your skin and the bag.  Leave the ice on for 15-20 minutes, 03-04 times a day for the first 2 to 3 days. After that, you can switch between ice and heat packs.  Ask your  doctor about back exercises or massage.  Avoid feeling anxious or stressed. Find good ways to deal with stress, such as exercise. GET HELP RIGHT AWAY IF:   Your pain does not go away with rest or medicine.  Your pain does not go away in 1 week.  You have new problems.  You do not feel well.  The pain spreads into your legs.  You cannot control when you poop (bowel movement) or pee (urinate).  Your arms or legs feel weak or lose feeling (numbness).  You feel sick to your stomach (nauseous) or throw up (vomit).  You have belly (abdominal) pain.  You feel like you may pass out (faint). MAKE SURE YOU:   Understand  these instructions.  Will watch your condition.  Will get help right away if you are not doing well or get worse. Document Released: 02/21/2008 Document Revised: 11/27/2011 Document Reviewed: 01/23/2011 [facility] Patient Information 2014 [place].

## 2014-02-10 NOTE — ED Notes (Signed)
Patient asked to change into gown. 

## 2014-02-20 ENCOUNTER — Telehealth: Payer: Self-pay | Admitting: Family Medicine

## 2014-02-20 NOTE — Telephone Encounter (Signed)
Left message for pt to call. Received sign medical records request from [place]. Pt was ask to call back and confirm she is transferring out and to pay 10 records charge.

## 2014-03-10 ENCOUNTER — Telehealth: Payer: Self-pay | Admitting: Internal Medicine

## 2014-03-10 NOTE — Telephone Encounter (Signed)
Pt has been called 3 times (02/20/14,02/25/14,02/27/14) already about medical records to find out if she is transferring, if so then $10.00 charge fee. Pt has not called back yet to let us know. When she calls back, records can proceed

## 2014-06-24 ENCOUNTER — Telehealth: Payer: Self-pay | Admitting: Hematology and Oncology

## 2014-06-24 NOTE — Telephone Encounter (Signed)
S/W PATIENT AND GAVE NP APPT FOR 10/19 @ 2 W/DR. McIntire

## 2014-07-06 ENCOUNTER — Ambulatory Visit: Payer: 59

## 2014-07-06 ENCOUNTER — Other Ambulatory Visit: Payer: Self-pay | Admitting: Hematology and Oncology

## 2014-07-06 ENCOUNTER — Encounter: Payer: Self-pay | Admitting: Hematology and Oncology

## 2014-07-06 ENCOUNTER — Ambulatory Visit (HOSPITAL_BASED_OUTPATIENT_CLINIC_OR_DEPARTMENT_OTHER): Payer: 59 | Admitting: Hematology and Oncology

## 2014-07-06 ENCOUNTER — Telehealth: Payer: Self-pay | Admitting: Hematology and Oncology

## 2014-07-06 ENCOUNTER — Ambulatory Visit (HOSPITAL_BASED_OUTPATIENT_CLINIC_OR_DEPARTMENT_OTHER): Payer: 59

## 2014-07-06 VITALS — BP 153/73 | HR 56 | Temp 97.9°F | Resp 20 | Ht 65.0 in | Wt 242.8 lb

## 2014-07-06 DIAGNOSIS — Z23 Encounter for immunization: Secondary | ICD-10-CM

## 2014-07-06 DIAGNOSIS — D539 Nutritional anemia, unspecified: Secondary | ICD-10-CM

## 2014-07-06 DIAGNOSIS — D509 Iron deficiency anemia, unspecified: Secondary | ICD-10-CM

## 2014-07-06 DIAGNOSIS — E119 Type 2 diabetes mellitus without complications: Secondary | ICD-10-CM

## 2014-07-06 DIAGNOSIS — Z85820 Personal history of malignant melanoma of skin: Secondary | ICD-10-CM | POA: Insufficient documentation

## 2014-07-06 DIAGNOSIS — D72829 Elevated white blood cell count, unspecified: Secondary | ICD-10-CM

## 2014-07-06 DIAGNOSIS — Z299 Encounter for prophylactic measures, unspecified: Secondary | ICD-10-CM | POA: Insufficient documentation

## 2014-07-06 HISTORY — DX: Nutritional anemia, unspecified: D53.9

## 2014-07-06 HISTORY — DX: Elevated white blood cell count, unspecified: D72.829

## 2014-07-06 LAB — COMPREHENSIVE METABOLIC PANEL (CC13)
ALBUMIN: 3.5 g/dL (ref 3.5–5.0)
ALK PHOS: 72 U/L (ref 40–150)
ALT: 65 U/L — ABNORMAL HIGH (ref 0–55)
AST: 63 U/L — AB (ref 5–34)
Anion Gap: 7 mEq/L (ref 3–11)
BILIRUBIN TOTAL: 0.37 mg/dL (ref 0.20–1.20)
BUN: 11.8 mg/dL (ref 7.0–26.0)
CO2: 28 mEq/L (ref 22–29)
Calcium: 9.3 mg/dL (ref 8.4–10.4)
Chloride: 104 mEq/L (ref 98–109)
Creatinine: 0.7 mg/dL (ref 0.6–1.1)
GLUCOSE: 176 mg/dL — AB (ref 70–140)
POTASSIUM: 4.1 meq/L (ref 3.5–5.1)
SODIUM: 139 meq/L (ref 136–145)
Total Protein: 7 g/dL (ref 6.4–8.3)

## 2014-07-06 LAB — CBC WITH DIFFERENTIAL/PLATELET
BASO%: 0.5 % (ref 0.0–2.0)
BASOS ABS: 0.1 10*3/uL (ref 0.0–0.1)
EOS%: 1.4 % (ref 0.0–7.0)
Eosinophils Absolute: 0.1 10*3/uL (ref 0.0–0.5)
HCT: 35.4 % (ref 34.8–46.6)
HEMOGLOBIN: 11 g/dL — AB (ref 11.6–15.9)
LYMPH#: 4.2 10*3/uL — AB (ref 0.9–3.3)
LYMPH%: 39.7 % (ref 14.0–49.7)
MCH: 24.5 pg — AB (ref 25.1–34.0)
MCHC: 30.9 g/dL — AB (ref 31.5–36.0)
MCV: 79.3 fL — AB (ref 79.5–101.0)
MONO#: 0.6 10*3/uL (ref 0.1–0.9)
MONO%: 5.6 % (ref 0.0–14.0)
NEUT#: 5.6 10*3/uL (ref 1.5–6.5)
NEUT%: 52.8 % (ref 38.4–76.8)
Platelets: 296 10*3/uL (ref 145–400)
RBC: 4.47 10*6/uL (ref 3.70–5.45)
RDW: 16.8 % — AB (ref 11.2–14.5)
WBC: 10.6 10*3/uL — ABNORMAL HIGH (ref 3.9–10.3)

## 2014-07-06 LAB — CHCC SMEAR

## 2014-07-06 LAB — LACTATE DEHYDROGENASE (CC13): LDH: 179 U/L (ref 125–245)

## 2014-07-06 MED ORDER — INFLUENZA VAC SPLIT QUAD 0.5 ML IM SUSY
0.5000 mL | PREFILLED_SYRINGE | Freq: Once | INTRAMUSCULAR | Status: AC
  Administered 2014-07-06: 0.5 mL via INTRAMUSCULAR
  Filled 2014-07-06: qty 0.5

## 2014-07-06 NOTE — Progress Notes (Signed)
Checked in new pt with no financial concerns. °

## 2014-07-06 NOTE — Telephone Encounter (Signed)
Pt confirmed labs/ov per 10/19 POF, gave pt AVS.......  °

## 2014-07-06 NOTE — Patient Instructions (Signed)
Influenza Vaccine (Flu Vaccine, Inactivated or Recombinant) 2014-2015: What You Need to Know 1. Why get vaccinated? Influenza ("flu") is a contagious disease that spreads around the [place] every winter, usually between October and May. Flu is caused by influenza viruses, and is spread mainly by coughing, sneezing, and close contact. Anyone can get flu, but the risk of getting flu is highest among children. Symptoms come on suddenly and may last several days. They can include:  fever/chills  sore throat  muscle aches  fatigue  cough  headache  runny or stuffy nose Flu can make some people much sicker than others. These people include young children, people 65 and older, pregnant women, and people with certain health conditions-such as heart, lung or kidney disease, nervous system disorders, or a weakened immune system. Flu vaccination is especially important for these people, and anyone in close contact with them. Flu can also lead to pneumonia, and make existing medical conditions worse. It can cause diarrhea and seizures in children. Each year thousands of people in the [place] die from flu, and many more are hospitalized. Flu vaccine is the best protection against flu and its complications. Flu vaccine also helps prevent spreading flu from person to person. 2. Inactivated and recombinant flu vaccines You are getting an injectable flu vaccine, which is either an "inactivated" or "recombinant" vaccine. These vaccines do not contain any live influenza virus. They are given by injection with a needle, and often called the "flu shot."  A different live, attenuated (weakened) influenza vaccine is sprayed into the nostrils. This vaccine is described in a separate Vaccine Information Statement. Flu vaccination is recommended every year. Some children 6 months through 8 years of age might need two doses during one year. Flu viruses are always changing. Each year's flu vaccine is made  to protect against 3 or 4 viruses that are likely to cause disease that year. Flu vaccine cannot prevent all cases of flu, but it is the best defense against the disease.  It takes about 2 weeks for protection to develop after the vaccination, and protection lasts several months to a year. Some illnesses that are not caused by influenza virus are often mistaken for flu. Flu vaccine will not prevent these illnesses. It can only prevent influenza. Some inactivated flu vaccine contains a very small amount of a mercury-based preservative called thimerosal. Studies have shown that thimerosal in vaccines is not harmful, but flu vaccines that do not contain a preservative are available. 3. Some people should not get this vaccine Tell the person who gives you the vaccine:  If you have any severe, life-threatening allergies. If you ever had a life-threatening allergic reaction after a dose of flu vaccine, or have a severe allergy to any part of this vaccine, including (for example) an allergy to gelatin, antibiotics, or eggs, you may be advised not to get vaccinated. Most, but not all, types of flu vaccine contain a small amount of egg protein.  If you ever had Guillain-Barr Syndrome (a severe paralyzing illness, also called GBS). Some people with a history of GBS should not get this vaccine. This should be discussed with your doctor.  If you are not feeling well. It is usually okay to get flu vaccine when you have a mild illness, but you might be advised to wait until you feel better. You should come back when you are better. 4. Risks of a vaccine reaction With a vaccine, like any medicine, there is a chance of side   effects. These are usually mild and go away on their own. Problems that could happen after any vaccine:  Brief fainting spells can happen after any medical procedure, including vaccination. Sitting or lying down for about 15 minutes can help prevent fainting, and injuries caused by a fall. Tell  your doctor if you feel dizzy, or have vision changes or ringing in the ears.  Severe shoulder pain and reduced range of motion in the arm where a shot was given can happen, very rarely, after a vaccination.  Severe allergic reactions from a vaccine are very rare, estimated at less than 1 in a million doses. If one were to occur, it would usually be within a few minutes to a few hours after the vaccination. Mild problems following inactivated flu vaccine:  soreness, redness, or swelling where the shot was given  hoarseness  sore, red or itchy eyes  cough  fever  aches  headache  itching  fatigue If these problems occur, they usually begin soon after the shot and last 1 or 2 days. Moderate problems following inactivated flu vaccine:  Young children who get inactivated flu vaccine and pneumococcal vaccine (PCV13) at the same time may be at increased risk for seizures caused by fever. Ask your doctor for more information. Tell your doctor if a child who is getting flu vaccine has ever had a seizure. Inactivated flu vaccine does not contain live flu virus, so you cannot get the flu from this vaccine. As with any medicine, there is a very remote chance of a vaccine causing a serious injury or death. The safety of vaccines is always being monitored. For more information, visit: www.cdc.gov/vaccinesafety/ 5. What if there is a serious reaction? What should I look for?  Look for anything that concerns you, such as signs of a severe allergic reaction, very high fever, or behavior changes. Signs of a severe allergic reaction can include hives, swelling of the face and throat, difficulty breathing, a fast heartbeat, dizziness, and weakness. These would start a few minutes to a few hours after the vaccination. What should I do?  If you think it is a severe allergic reaction or other emergency that can't wait, call 9-1-1 and get the person to the nearest hospital. Otherwise, call your  doctor.  Afterward, the reaction should be reported to the Vaccine Adverse Event Reporting System (VAERS). Your doctor should file this report, or you can do it yourself through the VAERS web site at www.vaers.hhs.gov, or by calling 1-800-822-7967. VAERS does not give medical advice. 6. The National Vaccine Injury Compensation Program The National Vaccine Injury Compensation Program (VICP) is a federal program that was created to compensate people who may have been injured by certain vaccines. Persons who believe they may have been injured by a vaccine can learn about the program and about filing a claim by calling 1-800-338-2382 or visiting the VICP website at www.hrsa.gov/vaccinecompensation. There is a time limit to file a claim for compensation. 7. How can I learn more?  Ask your health care provider.  Call your local or state health department.  Contact the Centers for Disease Control and Prevention (CDC):  Call 1-800-232-4636 (1-800-CDC-INFO) or  Visit CDC's website at www.cdc.gov/flu CDC Vaccine Information Statement (Interim) Inactivated Influenza Vaccine (05/06/2013) Document Released: 06/29/2006 Document Revised: 01/19/2014 Document Reviewed: 08/22/2013 ExitCare Patient Information 2015 [employer]. This information is not intended to replace advice given to you by your health care provider. Make sure you discuss any questions you have with your health   care provider.  

## 2014-07-06 NOTE — Assessment & Plan Note (Signed)
She has mild anemia. She was on iron supplement recently. I will recheck iron study. Sometimes, mild anemia chronic disease can also cause microcytic anemia.

## 2014-07-06 NOTE — Assessment & Plan Note (Signed)
We discussed the importance of preventive care and reviewed the vaccination programs. She does not have any prior allergic reactions to influenza vaccination. She agrees to proceed with influenza vaccination today and we will administer it today at the clinic.  

## 2014-07-06 NOTE — Assessment & Plan Note (Signed)
This is mild and fluctuated over the years. Likely reactive in nature. She had recurrent dental pain due to poor dentition over her jaw and recurrent cyst infection around her labia. I recommend observation only.

## 2014-07-06 NOTE — Assessment & Plan Note (Signed)
I recommend dietary modification  goal and weight loss of 2 pounds per month. I plan to see her back in 6 months.

## 2014-07-06 NOTE — Assessment & Plan Note (Signed)
Clinically, she has no recurrence. I recommend skin protection and yearly for surveillance examination with dermatologist.   
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
## 2014-07-07 ENCOUNTER — Ambulatory Visit: Payer: 59 | Admitting: Interventional Cardiology

## 2014-07-07 LAB — SEDIMENTATION RATE: Sed Rate: 17 mm/hr (ref 0–22)

## 2014-07-10 ENCOUNTER — Encounter: Payer: Self-pay | Admitting: Interventional Cardiology

## 2014-08-06 ENCOUNTER — Encounter: Payer: Self-pay | Admitting: Interventional Cardiology

## 2014-09-04 ENCOUNTER — Ambulatory Visit (INDEPENDENT_AMBULATORY_CARE_PROVIDER_SITE_OTHER): Payer: Self-pay | Admitting: Family Medicine

## 2014-09-04 VITALS — BP 122/80 | Wt 233.2 lb

## 2014-09-04 DIAGNOSIS — E119 Type 2 diabetes mellitus without complications: Secondary | ICD-10-CM

## 2014-09-04 NOTE — Assessment & Plan Note (Signed)
Subjective:  Patient is here for an annual pharmacy visit as part of the [facility] [employer]. Earlier this morning she had a visit with PCP, Yaakov Guthrie. A1C was drawn today and is currently pending. She injured her left knee two weeks ago and is going to have imaging today for it. No medication changes. Patient is taking metformin 500 mg ER 2 tablets twice daily. Patient is also taking ARB and statin.  Refilled medications today; several medications were 72 days past due. Patient states that she has been taking medications and hasn't missed doses.     Disease Assessments:  Diabetes: Current Diabetes related medical conditions are Eye problems, High blood pressure; checks feet rarely; Type of Diabetes: Type 2; Year of diagnosis 2006; Diabetes Education Iver Nestle- 2006; hypoglycemia frequency never; MD managing Diabetes Yaakov Guthrie; Sees Diabetes provider 3 times per year; What is target A1c? <6.5 %;   does not take an aspirin a day; takes medications as prescribed Adherenthas repeated hx of non aherence however; uses glucometer True Result; checks blood glucose Never;   Other Diabetes History:  Patient states that she is not checking blood sugar at all right now. She does have a new meter but has not used it. She does not have a prescription for testing supplies. Denies hypoglycemia.             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 She states that she is not tracking her steps and her activity level. She states that the watch prompts her to move more throughout the day. She denies any additional physical activity.  Nutrition- Patient is enrolled in weight watchers. She is tracking food on her phone and is also going to the meetings. She has 31 points each day. She states that she is casually tracking her food. Weight watchers  recently switched to smart points and patient states that she is relearning what foods to eat since they all have a different point value.  Patient states that she has not been doing well with holiday treats and snacking. She reports that she is snacking more often.    Preventive Care:      Dilated Eye Exam: 10/16/2014  Flu vaccine: 06/17/2014  Other Preventive Care Notes:  Dental Visit- October 2015      Overall Health Assessments:  Vision:  Dilated Eye Exam: 10/16/2014   Vital Signs:    09/04/2014 10:54 AM (EST)Blood Pressure 122 / 80 mm/HgBMI 38.8; Height 5 ft 5 in; Weight 233.2 lbs    Care Planning:  Learning Preference Assessment:  Learner: Patient  Readiness to Learn Barriers: None  Teaching Method: Explanation  Evaluation of Learning: Needs review and assistance  Readiness to Change:  How important is your health to you? 10  How confident are you in working to improve your health? 5  How ready are you to change to improve your health? 5  Total Score: 7  Care Plan:  09/04/2014 11:28 AM (EST) (4)  Problem: Physical Inactivity  Role: Clinical Pharmacist  Long Term Goal Start walking twice weekly for 60 minutes.  Date Started: 09/04/2014  09/04/2014 11:28 AM (EST) (3)  Problem: Self Monitoring Blood Glucose  Role: Clinical Pharmacist  Long Term Goal Check blood sugar at least once weekly  Date Started: 09/04/2014  09/04/2014 10:29 AM (EST) (1)  Assessment/Plan: Patient is a 63 year old female with DM2. A1C was drawn earlier this morning with Dr. Jodi Mourning office and results are pending. Patient is not checking blood sugar at all so I do not have any way to estimate glycemic control at this time. Patient has lost 7 pounds since her last visit with Marcie Bal, [facility]. Reviewed medications with patient and refilled meds that were past due.  Patient is paying for weight watchers and is attending the meetings but is not tracking her food. I encouraged her to  start tracking today and to try and stay within her points. She states that it has been difficult to avoid holiday treats. I encouraged her to continue declining holiday treats.  Patient is also paying for a gym membership but is not currently exercising. She states that she has a friend that wants her to complete a 5K with her in the spring. Patient states that she could start walking in order to get ready for the 5K. Encouraged patient to start physical activity. Also encouraged patient to use her apple watch to help motivate her to be more physically active.  Patient will follow up with Marcie Bal in March.  Goals for Next Visit- 1. Recommit to weight watchers. Start tracking your food every day. Try to stay within your allotted for the day. 2. When faced with holiday cookies/treats, etc., continue to politely decline. 3. Start walking on the treadmill at [employer]. To start, go twice a week (Tuesday/Thursday) for an hour. Schedule a time to go exercise. Use your new watch to help you schedule time to exercise and to stay focused on getting out to exercise. 4. Start checking blood sugar once a week on Tuesdays. Next appointment to see Marcie Bal is March 17th at 5:15 PM.

## 2014-10-01 NOTE — Progress Notes (Signed)
Patient ID: [name], female   DOB: May 07, 1951, 65 y.o.   MRN: [id number] Reviewed: Agree with the documentation and management of our [place].

## 2015-01-05 ENCOUNTER — Ambulatory Visit (HOSPITAL_BASED_OUTPATIENT_CLINIC_OR_DEPARTMENT_OTHER): Payer: 59 | Admitting: Hematology and Oncology

## 2015-01-05 ENCOUNTER — Encounter: Payer: Self-pay | Admitting: Hematology and Oncology

## 2015-01-05 ENCOUNTER — Other Ambulatory Visit (HOSPITAL_BASED_OUTPATIENT_CLINIC_OR_DEPARTMENT_OTHER): Payer: 59

## 2015-01-05 VITALS — BP 171/72 | HR 66 | Temp 98.4°F | Resp 18 | Ht 65.0 in | Wt 238.1 lb

## 2015-01-05 DIAGNOSIS — Z85820 Personal history of malignant melanoma of skin: Secondary | ICD-10-CM | POA: Diagnosis not present

## 2015-01-05 DIAGNOSIS — D509 Iron deficiency anemia, unspecified: Secondary | ICD-10-CM

## 2015-01-05 DIAGNOSIS — D72829 Elevated white blood cell count, unspecified: Secondary | ICD-10-CM

## 2015-01-05 DIAGNOSIS — D539 Nutritional anemia, unspecified: Secondary | ICD-10-CM

## 2015-01-05 LAB — COMPREHENSIVE METABOLIC PANEL (CC13)
ALBUMIN: 3.6 g/dL (ref 3.5–5.0)
ALK PHOS: 71 U/L (ref 40–150)
ALT: 64 U/L — ABNORMAL HIGH (ref 0–55)
ANION GAP: 12 meq/L — AB (ref 3–11)
AST: 65 U/L — ABNORMAL HIGH (ref 5–34)
BUN: 11.2 mg/dL (ref 7.0–26.0)
CO2: 24 meq/L (ref 22–29)
Calcium: 9 mg/dL (ref 8.4–10.4)
Chloride: 102 mEq/L (ref 98–109)
Creatinine: 0.7 mg/dL (ref 0.6–1.1)
Glucose: 219 mg/dl — ABNORMAL HIGH (ref 70–140)
POTASSIUM: 3.9 meq/L (ref 3.5–5.1)
SODIUM: 139 meq/L (ref 136–145)
TOTAL PROTEIN: 7.1 g/dL (ref 6.4–8.3)
Total Bilirubin: 0.24 mg/dL (ref 0.20–1.20)

## 2015-01-05 LAB — CBC WITH DIFFERENTIAL/PLATELET
BASO%: 0.4 % (ref 0.0–2.0)
Basophils Absolute: 0 10*3/uL (ref 0.0–0.1)
EOS ABS: 0.2 10*3/uL (ref 0.0–0.5)
EOS%: 1.7 % (ref 0.0–7.0)
HCT: 35.6 % (ref 34.8–46.6)
HGB: 11.1 g/dL — ABNORMAL LOW (ref 11.6–15.9)
LYMPH%: 41.3 % (ref 14.0–49.7)
MCH: 24.2 pg — ABNORMAL LOW (ref 25.1–34.0)
MCHC: 31.2 g/dL — AB (ref 31.5–36.0)
MCV: 77.6 fL — AB (ref 79.5–101.0)
MONO#: 0.5 10*3/uL (ref 0.1–0.9)
MONO%: 5.4 % (ref 0.0–14.0)
NEUT%: 51.2 % (ref 38.4–76.8)
NEUTROS ABS: 5 10*3/uL (ref 1.5–6.5)
PLATELETS: 247 10*3/uL (ref 145–400)
RBC: 4.59 10*6/uL (ref 3.70–5.45)
RDW: 15.7 % — ABNORMAL HIGH (ref 11.2–14.5)
WBC: 9.8 10*3/uL (ref 3.9–10.3)
lymph#: 4.1 10*3/uL — ABNORMAL HIGH (ref 0.9–3.3)

## 2015-01-05 LAB — IRON AND TIBC CHCC
%SAT: 8 % — AB (ref 21–57)
IRON: 31 ug/dL — AB (ref 41–142)
TIBC: 406 ug/dL (ref 236–444)
UIBC: 375 ug/dL (ref 120–384)

## 2015-01-05 LAB — CHCC SMEAR

## 2015-01-05 LAB — LACTATE DEHYDROGENASE (CC13): LDH: 164 U/L (ref 125–245)

## 2015-01-05 LAB — FERRITIN CHCC: Ferritin: 21 ng/ml (ref 9–269)

## 2015-01-06 ENCOUNTER — Telehealth: Payer: Self-pay | Admitting: *Deleted

## 2015-01-06 LAB — SEDIMENTATION RATE: Sed Rate: 17 mm/hr (ref 0–30)

## 2015-01-06 NOTE — Telephone Encounter (Signed)
-----   Message from Heath Lark, MD sent at 01/05/2015  3:46 PM EDT ----- Regarding: low iron Instructions:  1) OTC iron daily at bedtime 2) Schedule colonoscopy with GI (I think she has a GI doc, if not we can refer) for iron def anemia  Thanks

## 2015-01-06 NOTE — Telephone Encounter (Signed)
Informed pt of Dr. Calton Dach instructions to start taking Iron at bedtime and to call GI MD for Colonoscopy.  Pt verbalized understanding and will contact her GI, Dr. Amedeo Plenty at [facility] for appt..  I faxed lab results to Dr. Amedeo Plenty at fax [phone].

## 2015-01-07 NOTE — Assessment & Plan Note (Signed)
Clinically, she has no recurrence. I recommend skin protection and yearly for surveillance examination with dermatologist.

## 2015-01-07 NOTE — Progress Notes (Signed)
[place] NOTE  Robin Harada, MD SUMMARY OF HEMATOLOGIC HISTORY:  She was found to have abnormal CBC from routine blood work monitoring.  she had chronic leukocytosis since 2009 and her white blood cell count has ranged from 10.6-14. She  had history of recurrent infection. She had recurrent infection near her labia and a dental pain affecting one of her tooth. The last prescription antibiotics was more than 3 months ago There is not reported symptoms of sinus congestion, cough, urinary frequency/urgency or dysuria, diarrhea, joint swelling/pain or abnormal skin rash.  She had no prior history or diagnosis of cancer. Her age appropriate screening programs are up-to-date. The patient has no prior diagnosis of autoimmune disease. She was prescribed prednisone therapy earlier this year for detached retina and was successfully taper off. Patient has morbid obesity and have poorly controlled diabetes. She had history of abnormal change behind her right eye on MRI in 2014 that was subsequently biopsied and was negative for malignancy. She had a melanoma removed from the left face and Mohs procedure in 2006. She had early stage I disease and never require adjuvant treatment.  INTERVAL HISTORY: [name] 64 y.o. female returns for further follow-up. She is vigilant about skin exam and denies recent new skin lesions. Denies recent infection.   I have reviewed the past medical history, past surgical history, social history and family history with the patient and they are unchanged from previous note.  ALLERGIES:  is allergic to ace inhibitors and acetaminophen.  MEDICATIONS:  Current Outpatient Prescriptions  Medication Sig Dispense Refill  . aspirin 325 MG EC tablet Take 325 mg by mouth daily.    Marland Kitchen atorvastatin (LIPITOR) 20 MG tablet Take 20 mg by mouth daily.    . Cholecalciferol (VITAMIN D3) 2000 UNITS TABS Take 2 tablets by mouth daily. 4000 IU  daily    . glucose blood test strip Use as instructed 100 each 0  . ibuprofen (ADVIL,MOTRIN) 200 MG tablet Take 200 mg by mouth every 8 (eight) hours as needed.     . metFORMIN (GLUMETZA) 500 MG (MOD) 24 hr tablet Take 1,000 mg by mouth 2 (two) times daily with a meal.     . Multiple Vitamin (MULTIVITAMIN) tablet Take 1 tablet by mouth daily.    Marland Kitchen SYNTHROID 125 MCG tablet Take 1 tablet (125 mcg total) by mouth daily. 90 tablet 1  . telmisartan-hydrochlorothiazide (MICARDIS HCT) 80-25 MG per tablet Take 1 tablet by mouth every morning. 90 tablet 1  . [DISCONTINUED] sertraline (ZOLOFT) 100 MG tablet Take 1 tablet (100 mg total) by mouth daily. At bedtime 30 tablet 1   No current facility-administered medications for this visit.     REVIEW OF SYSTEMS:   Constitutional: Denies fevers, chills or night sweats Eyes: Denies blurriness of vision Ears, nose, mouth, throat, and face: Denies mucositis or sore throat Respiratory: Denies cough, dyspnea or wheezes Cardiovascular: Denies palpitation, chest discomfort or lower extremity swelling Gastrointestinal:  Denies nausea, heartburn or change in bowel habits Skin: Denies abnormal skin rashes Lymphatics: Denies new lymphadenopathy or easy bruising Neurological:Denies numbness, tingling or new weaknesses Behavioral/Psych: Mood is stable, no new changes  All other systems were reviewed with the patient and are negative.  PHYSICAL EXAMINATION: ECOG PERFORMANCE STATUS: 0 - Asymptomatic  Filed Vitals:   01/05/15 1447  BP: 171/72  Pulse: 66  Temp: 98.4 F (36.9 C)  Resp: 18   Filed Weights   01/05/15 1447  Weight: 238 lb  1.6 oz (108.001 kg)    GENERAL:alert, no distress and comfortable SKIN: skin color, texture, turgor are normal, no rashes or significant lesions EYES: normal, Conjunctiva are pink and non-injected, sclera clear OROPHARYNX:no exudate, no erythema and lips, buccal mucosa, and tongue normal  NECK: supple, thyroid normal size,  non-tender, without nodularity LYMPH:  no palpable lymphadenopathy in the cervical, axillary or inguinal LUNGS: clear to auscultation and percussion with normal breathing effort HEART: regular rate & rhythm and no murmurs and no lower extremity edema ABDOMEN:abdomen soft, non-tender and normal bowel sounds Musculoskeletal:no cyanosis of digits and no clubbing  NEURO: alert & oriented x 3 with fluent speech, no focal motor/sensory deficits  LABORATORY DATA:  I have reviewed the data as listed Results for orders placed or performed in visit on 01/05/15 (from the past 48 hour(s))  Iron and TIBC     Status: Abnormal   Collection Time: 01/05/15  2:33 PM  Result Value Ref Range   Iron 31 (L) 41 - 142 ug/dL   TIBC 406 236 - 444 ug/dL   UIBC 375 120 - 384 ug/dL   %SAT 8 (L) 21 - 57 %  Ferritin     Status: None   Collection Time: 01/05/15  2:33 PM  Result Value Ref Range   Ferritin 21 9 - 269 ng/ml  CBC with Differential     Status: Abnormal   Collection Time: 01/05/15  2:33 PM  Result Value Ref Range   WBC 9.8 3.9 - 10.3 10e3/uL   NEUT# 5.0 1.5 - 6.5 10e3/uL   HGB 11.1 (L) 11.6 - 15.9 g/dL   HCT 35.6 34.8 - 46.6 %   Platelets 247 145 - 400 10e3/uL   MCV 77.6 (L) 79.5 - 101.0 fL   MCH 24.2 (L) 25.1 - 34.0 pg   MCHC 31.2 (L) 31.5 - 36.0 g/dL   RBC 4.59 3.70 - 5.45 10e6/uL   RDW 15.7 (H) 11.2 - 14.5 %   lymph# 4.1 (H) 0.9 - 3.3 10e3/uL   MONO# 0.5 0.1 - 0.9 10e3/uL   Eosinophils Absolute 0.2 0.0 - 0.5 10e3/uL   Basophils Absolute 0.0 0.0 - 0.1 10e3/uL   NEUT% 51.2 38.4 - 76.8 %   LYMPH% 41.3 14.0 - 49.7 %   MONO% 5.4 0.0 - 14.0 %   EOS% 1.7 0.0 - 7.0 %   BASO% 0.4 0.0 - 2.0 %  Sedimentation rate     Status: None   Collection Time: 01/05/15  2:33 PM  Result Value Ref Range   Sed Rate 17 0 - 30 mm/hr  Comprehensive metabolic panel     Status: Abnormal   Collection Time: 01/05/15  2:33 PM  Result Value Ref Range   Sodium 139 136 - 145 mEq/L   Potassium 3.9 3.5 - 5.1 mEq/L    Chloride 102 98 - 109 mEq/L   CO2 24 22 - 29 mEq/L   Glucose 219 (H) 70 - 140 mg/dl   BUN 11.2 7.0 - 26.0 mg/dL   Creatinine 0.7 0.6 - 1.1 mg/dL   Total Bilirubin 0.24 0.20 - 1.20 mg/dL   Alkaline Phosphatase 71 40 - 150 U/L   AST 65 (H) 5 - 34 U/L   ALT 64 (H) 0 - 55 U/L   Total Protein 7.1 6.4 - 8.3 g/dL   Albumin 3.6 3.5 - 5.0 g/dL   Calcium 9.0 8.4 - 10.4 mg/dL   Anion Gap 12 (H) 3 - 11 mEq/L   EGFR >90 >90 ml/min/1.73  m2    Comment: eGFR is calculated using the CKD-EPI Creatinine Equation (2009)  Lactate dehydrogenase     Status: None   Collection Time: 01/05/15  2:33 PM  Result Value Ref Range   LDH 164 125 - 245 U/L  Smear     Status: None   Collection Time: 01/05/15  2:33 PM  Result Value Ref Range   Smear Result Smear Available     Lab Results  Component Value Date   WBC 9.8 01/05/2015   HGB 11.1* 01/05/2015   HCT 35.6 01/05/2015   MCV 77.6* 01/05/2015   PLT 247 01/05/2015    ASSESSMENT & PLAN:  Leukocytosis This is likely benign in nature. It has resolved. No further workup is needed   Deficiency anemia She has evidence of iron deficiency anemia. Recommend iron supplement and recommended GI evaluation with colonoscopy.   History of melanoma Clinically, she has no recurrence. I recommend skin protection and yearly for surveillance examination with dermatologist.      All questions were answered. The patient knows to call the clinic with any problems, questions or concerns. No barriers to learning was detected.  I spent 15 minutes counseling the patient face to face. The total time spent in the appointment was 20 minutes and more than 50% was on counseling.     [facility], Jasmeen Fritsch, MD 4/21/20169:00 AM

## 2015-01-07 NOTE — Assessment & Plan Note (Signed)
She has evidence of iron deficiency anemia. Recommend iron supplement and recommended GI evaluation with colonoscopy.

## 2015-01-07 NOTE — Assessment & Plan Note (Signed)
This is likely benign in nature. It has resolved. No further workup is needed

## 2015-01-15 ENCOUNTER — Encounter: Payer: Self-pay | Admitting: *Deleted

## 2015-02-02 ENCOUNTER — Encounter: Payer: Self-pay | Admitting: *Deleted

## 2015-02-03 ENCOUNTER — Other Ambulatory Visit: Payer: Self-pay | Admitting: *Deleted

## 2015-02-04 NOTE — Patient Outreach (Addendum)
[facility]) Care Management   02/03/15  [name] 11/21/1950 [id number]  [name] is an 64 y.o. female who presents for routine follow up for self management assistance of Type II DM and HTn and hyperlipidemia.  Subjective:  [name] states she has not been taking her Metformin and Micardis HCT because of GI upset. Says she is not checking her blood sugars even though she was issued a True Result glucometer at the last visit.  She says she has an appointment with Dr. Jacelyn Grip this Friday and she wants to discuss other treatment options for her diabetes since she she does not tolerate the Metformin. She verified that she is on the extended release form of Metformin and confirmed that she always takes it with food. She says she does have some numbness on the outer lateral portion of both feet but denies skin impairment.  Objective:   [name] wears a Visual merchandiser on her left wrist that monitors her steps and heart rate. Review of Systems  Constitutional: Negative.     Physical Exam  Constitutional: She is oriented to person, place, and time. She appears well-developed and well-nourished.  Neurological: She is alert and oriented to person, place, and time.  Psychiatric: She has a normal mood and affect. Her behavior is normal. Judgment and thought content normal.   Filed Weights   02/03/15 1013  Weight: 235 lb 3.2 oz (106.686 kg)   Filed Vitals:   02/03/15 1013  BP: 128/80    Current Medications:   Current Outpatient Prescriptions  Medication Sig Dispense Refill  . aspirin 325 MG EC tablet Take 325 mg by mouth daily.    . Cholecalciferol (VITAMIN D3) 2000 UNITS TABS Take 2 tablets by mouth daily. 4000 IU daily    . ibuprofen (ADVIL,MOTRIN) 200 MG tablet Take 200 mg by mouth every 8 (eight) hours as needed.     . Multiple Vitamin (MULTIVITAMIN) tablet Take 1 tablet by mouth daily.    Marland Kitchen SYNTHROID 125 MCG tablet Take 1 tablet (125 mcg total) by mouth daily. 90  tablet 1  . atorvastatin (LIPITOR) 20 MG tablet Take 20 mg by mouth daily.    Marland Kitchen glucose blood test strip Use as instructed (Patient not taking: Reported on 02/03/2015) 100 each 0  . metFORMIN (GLUMETZA) 500 MG (MOD) 24 hr tablet Take 1,000 mg by mouth 2 (two) times daily with a meal.     . telmisartan-hydrochlorothiazide (MICARDIS HCT) 80-25 MG per tablet Take 1 tablet by mouth every morning. (Patient not taking: Reported on 02/03/2015) 90 tablet 1  . [DISCONTINUED] sertraline (ZOLOFT) 100 MG tablet Take 1 tablet (100 mg total) by mouth daily. At bedtime 30 tablet 1   No current facility-administered medications for this visit.    Functional Status:   In your present state of health, do you have any difficulty performing the following activities: 02/03/2015  Hearing? N  Vision? N  Difficulty concentrating or making decisions? N  Walking or climbing stairs? N  Dressing or bathing? N  Doing errands, shopping? N    Fall/Depression Screening:    PHQ 2/9 Scores 02/03/2015  PHQ - 2 Score 6  PHQ- 9 Score 14   THN CM Care Plan Problem One        Patient Outreach from 02/03/2015 in [place] Problem One  Type II DM not meeting A1C target secondary to medication nonadherence   Care Plan for Problem One  Active  THN Long Term Goal (31-90 days)  Improved glycemic control as evidenced by improvement in A1C of <8.0% and improved medication adherence as evidenced by [name] reporting that she has taken her diabetes medicine at least 75% of the time at/by next Link To Wellness visit    [place] Start Date  02/03/15   Interventions for Problem One Long Term Goal  using a picture representation, reviewed the 8 core pathophysiologic deficits in Type II diabetes. discussed physiology of diabetes as a chronic progressive disease with the initial problem of insulin resistance in the muscle, liver and fat cells and then increased loss of beta cell function over time resulting  in decreased insulin production, discussed role of obesity, especially central obesity, on insulin resistance, reviewed patient medications, discussed DM medication of Metformin including the mechanism of action, common side effects, dosages and dosing schedule,  discussed other medication treatment options to improve glycemic control such as GLP1 receptor agonists  and SGLT2 inhibitors and provided handouts on how they work and common side effects, encouraged [name] to discuss with Dr Jacelyn Grip when she sees him on 5/20. Encouraged [name] to rejoin [employer] since  she has had success in losing weight with the program in the past. Will arrange for Link To Wellness follow up after she sees Dr. Jacelyn Grip on 5/20.      Assessment:   Link to Wellness with Type II DM currently not meeting A1C target and not taking DM medications related to side effects.  Plan:  RNCM to fax today's office visit note to Dr. Jacelyn Grip. RNCM will meet at least quarterly and as needed with patient per Link To Wellness program guidelines to assist with Type II DM self-management and assess patient's progress toward mutually set goals.   Barrington Ellison RN,CCM,CDE [place] Management Coordinator Link To Wellness Office Phone [phone] Office Fax [phone]

## 2015-08-17 ENCOUNTER — Other Ambulatory Visit: Payer: Self-pay | Admitting: *Deleted

## 2015-08-17 NOTE — Patient Outreach (Signed)
Spoke with [name] to arrange Link To Wellness follow up on 12/1/ at 10:00 am. Barrington Ellison RN,CCM,CDE [place] Management Coordinator Link To Wellness Office Phone [phone] Office Fax [phone]

## 2015-08-19 ENCOUNTER — Ambulatory Visit: Payer: 59 | Admitting: *Deleted

## 2015-08-20 ENCOUNTER — Other Ambulatory Visit: Payer: Self-pay | Admitting: *Deleted

## 2015-08-20 VITALS — BP 134/74 | Ht 65.0 in | Wt 235.0 lb

## 2015-08-20 DIAGNOSIS — E119 Type 2 diabetes mellitus without complications: Secondary | ICD-10-CM

## 2015-08-20 LAB — POCT CBG (FASTING - GLUCOSE)-MANUAL ENTRY: Glucose Fasting, POC: 167 mg/dL — AB (ref 70–99)

## 2015-08-20 LAB — POCT GLYCOSYLATED HEMOGLOBIN (HGB A1C): Hemoglobin A1C: 8.7

## 2015-08-22 NOTE — Patient Outreach (Signed)
[facility]) Care Management   08/20/2015  [name] April 03, 1951 [id number]  [name] is an 64 y.o. female who presents to the [place] Management office for routine Link To Wellness follow up for self management assistance with Type II DM, HTN and hyperlipidemia.  Subjective:  [name] says she has been bothered by occasional left wrist pain, she thinks it may be carpal tunnel, and is currently wearing a left wrist brace. She also wears an Apple Watch that tracks her steps and helps her with scheduling.  She says the only medications she has been taking are Synthroid, iron and aspirin. She says the Metformin causes GI symptoms and she feels better when she doesn't take her blood pressure and cholesterol medicine.  Objective:   Review of Systems  Constitutional: Negative.     Physical Exam  Constitutional: She is oriented to person, place, and time. She appears well-developed and well-nourished.  Respiratory: Effort normal.  Neurological: She is alert and oriented to person, place, and time.  Skin: Skin is warm and dry.  Psychiatric: She has a normal mood and affect. Her behavior is normal. Judgment and thought content normal.   Filed Vitals:   08/20/15 1020  BP: 134/74   Current Medications:   Current Outpatient Prescriptions  Medication Sig Dispense Refill  . aspirin 325 MG EC tablet Take 325 mg by mouth daily. States she is taking   . Cholecalciferol (VITAMIN D3) 2000 UNITS TABS Take 2 tablets by mouth daily. 4000 IU daily    . ibuprofen (ADVIL,MOTRIN) 200 MG tablet Take 200 mg by mouth every 8 (eight) hours as needed.     . Iron, Ferrous Gluconate, 256 (28 FE) MG TABS Take 1 tablet by mouth. States she is taking   . SYNTHROID 125 MCG tablet Take 1 tablet (125 mcg total) by mouth daily. States she is taking 1  . atorvastatin (LIPITOR) 20 MG tablet Take 20 mg by mouth daily. States she is not taking   . glucose blood test  strip Use as instructed (Patient not taking: Reported on 02/03/2015) States she is not checking her blood sugar 0  . metFORMIN (GLUMETZA) 500 MG (MOD) 24 hr tablet Take 1,000 mg by mouth 2 (two) times daily with a meal.  States she is not taking   . telmisartan-hydrochlorothiazide (MICARDIS HCT) 80-25 MG per tablet Take 1 tablet by mouth every morning. (Patient not taking: Reported on 02/03/2015) States she is not taking 1   No current facility-administered medications for this visit.    Functional Status:   In your present state of health, do you have any difficulty performing the following activities: 02/03/2015  Hearing? N  Vision? N  Difficulty concentrating or making decisions? N  Walking or climbing stairs? N  Dressing or bathing? N  Doing errands, shopping? N    Fall/Depression Screening:    PHQ 2/9 Scores 02/03/2015  PHQ - 2 Score 6  PHQ- 9 Score 14    Assessment:   [employer] employee and Link To Wellness member with Type II DM, HTN and hyperlipidemia not meeting treatment targets for DM and hyperlipidemia as evidenced by today's POC Hgb A1C= 8.7% and lipid profile with elevated total cholesterol and LDL likely due to medication nonadherence, absence of exercise and nonadherence to CHO controlled meal plan.  Plan:  [facility] CM Care Plan Problem One        Most Recent Value   Care Plan Problem One  Type II DM not meeting Hgb A1C target, hyperlipidemia with elevated total cholesterol and LDL likely secondary to dietary and  medication nonadherence and absence of exercise   Role Documenting the Problem One  Care Management [place] for Problem One  Active   THN Long Term Goal (31-90 days)  Improved glycemic control as evidenced by improvement in A1C of <8.0% and improved medication adherence as evidenced by [name] reporting that she has taken her diabetes medicine at least 75% of the time at/by next Link To Wellness visit    [place] Goal Start Date  08/20/15    Interventions for Problem One Long Term Goal  spent the majority of the appointment discussing strategies to help [name] improve medication adherence, encouraged her to be honest with Dr. Jacelyn Grip and tell him she does not tolerate Metformin due to GI side effects, discussed another DM medication option, Januvia, and how it works and reviewed with her that she can get it at the [place] at no cost, discussed why an SGLT2 inhibitor is not a good option because she has chronic vaginal itching, encouraged her to discuss this option with Dr. Jacelyn Grip, reviewed other patient medications and encouraged her to resume taking them, reviewed results of her labs drawn on 9/8 and provided written information on how to improve her lipid panel, at patient's request, performed POC CBG and Hgb A1C and discussed results, reviewed Hgb A1c and CBG pre and post meal targets and correlation of Hgb A1C to estimated average glucose, arranged for Link To Wellness follow up in March 2017.     RNCM to fax today's office visit note to Dr. Jacelyn Grip. RNCM will meet quarterly and as needed with patient per Link To Wellness program guidelines to assist with Type II DM, HTN and hyperlipidemia self-management and assess patient's progress toward mutually set goals.  Barrington Ellison RN,CCM,CDE [place] Management Coordinator Link To Wellness Office Phone [phone] Office Fax [phone]

## 2015-09-29 DIAGNOSIS — N763 Subacute and chronic vulvitis: Secondary | ICD-10-CM | POA: Diagnosis not present

## 2015-10-05 ENCOUNTER — Emergency Department (HOSPITAL_COMMUNITY): Payer: 59

## 2015-10-05 ENCOUNTER — Emergency Department (HOSPITAL_COMMUNITY)
Admission: EM | Admit: 2015-10-05 | Discharge: 2015-10-05 | Disposition: A | Discharge status: Home / Self Care | Payer: 59 | Attending: Emergency Medicine | Admitting: Emergency Medicine

## 2015-10-05 ENCOUNTER — Encounter (HOSPITAL_COMMUNITY): Payer: Self-pay

## 2015-10-05 DIAGNOSIS — Z79899 Other long term (current) drug therapy: Secondary | ICD-10-CM | POA: Insufficient documentation

## 2015-10-05 DIAGNOSIS — M545 Low back pain: Secondary | ICD-10-CM | POA: Diagnosis not present

## 2015-10-05 DIAGNOSIS — Y998 Other external cause status: Secondary | ICD-10-CM | POA: Diagnosis not present

## 2015-10-05 DIAGNOSIS — Z7982 Long term (current) use of aspirin: Secondary | ICD-10-CM | POA: Insufficient documentation

## 2015-10-05 DIAGNOSIS — E039 Hypothyroidism, unspecified: Secondary | ICD-10-CM | POA: Diagnosis not present

## 2015-10-05 DIAGNOSIS — Y9289 Other specified places as the place of occurrence of the external cause: Secondary | ICD-10-CM | POA: Insufficient documentation

## 2015-10-05 DIAGNOSIS — E669 Obesity, unspecified: Secondary | ICD-10-CM | POA: Insufficient documentation

## 2015-10-05 DIAGNOSIS — Z8673 Personal history of transient ischemic attack (TIA), and cerebral infarction without residual deficits: Secondary | ICD-10-CM | POA: Diagnosis not present

## 2015-10-05 DIAGNOSIS — I10 Essential (primary) hypertension: Secondary | ICD-10-CM | POA: Insufficient documentation

## 2015-10-05 DIAGNOSIS — Y9389 Activity, other specified: Secondary | ICD-10-CM | POA: Insufficient documentation

## 2015-10-05 DIAGNOSIS — Z86010 Personal history of colonic polyps: Secondary | ICD-10-CM | POA: Diagnosis not present

## 2015-10-05 DIAGNOSIS — Z8669 Personal history of other diseases of the nervous system and sense organs: Secondary | ICD-10-CM | POA: Insufficient documentation

## 2015-10-05 DIAGNOSIS — Z85820 Personal history of malignant melanoma of skin: Secondary | ICD-10-CM | POA: Insufficient documentation

## 2015-10-05 DIAGNOSIS — S060X0A Concussion without loss of consciousness, initial encounter: Secondary | ICD-10-CM | POA: Diagnosis not present

## 2015-10-05 DIAGNOSIS — Z7984 Long term (current) use of oral hypoglycemic drugs: Secondary | ICD-10-CM | POA: Diagnosis not present

## 2015-10-05 DIAGNOSIS — Z8719 Personal history of other diseases of the digestive system: Secondary | ICD-10-CM | POA: Diagnosis not present

## 2015-10-05 DIAGNOSIS — Z87891 Personal history of nicotine dependence: Secondary | ICD-10-CM | POA: Insufficient documentation

## 2015-10-05 DIAGNOSIS — S39012A Strain of muscle, fascia and tendon of lower back, initial encounter: Secondary | ICD-10-CM | POA: Insufficient documentation

## 2015-10-05 DIAGNOSIS — E119 Type 2 diabetes mellitus without complications: Secondary | ICD-10-CM | POA: Insufficient documentation

## 2015-10-05 DIAGNOSIS — Z862 Personal history of diseases of the blood and blood-forming organs and certain disorders involving the immune mechanism: Secondary | ICD-10-CM | POA: Insufficient documentation

## 2015-10-05 DIAGNOSIS — M79644 Pain in right finger(s): Secondary | ICD-10-CM | POA: Diagnosis not present

## 2015-10-05 DIAGNOSIS — S63612A Unspecified sprain of right middle finger, initial encounter: Secondary | ICD-10-CM

## 2015-10-05 DIAGNOSIS — R011 Cardiac murmur, unspecified: Secondary | ICD-10-CM | POA: Insufficient documentation

## 2015-10-05 DIAGNOSIS — E7800 Pure hypercholesterolemia, unspecified: Secondary | ICD-10-CM | POA: Diagnosis not present

## 2015-10-05 DIAGNOSIS — W01198A Fall on same level from slipping, tripping and stumbling with subsequent striking against other object, initial encounter: Secondary | ICD-10-CM | POA: Insufficient documentation

## 2015-10-05 DIAGNOSIS — R51 Headache: Secondary | ICD-10-CM | POA: Diagnosis not present

## 2015-10-05 DIAGNOSIS — S069X1A Unspecified intracranial injury with loss of consciousness of 30 minutes or less, initial encounter: Secondary | ICD-10-CM

## 2015-10-05 DIAGNOSIS — S0990XA Unspecified injury of head, initial encounter: Secondary | ICD-10-CM | POA: Diagnosis not present

## 2015-10-05 DIAGNOSIS — S060X1A Concussion with loss of consciousness of 30 minutes or less, initial encounter: Secondary | ICD-10-CM | POA: Diagnosis not present

## 2015-10-05 DIAGNOSIS — S3992XA Unspecified injury of lower back, initial encounter: Secondary | ICD-10-CM | POA: Diagnosis present

## 2015-10-05 DIAGNOSIS — S069X9A Unspecified intracranial injury with loss of consciousness of unspecified duration, initial encounter: Secondary | ICD-10-CM

## 2015-10-05 MED ORDER — IBUPROFEN 800 MG PO TABS
800.0000 mg | ORAL_TABLET | Freq: Once | ORAL | Status: AC
  Administered 2015-10-05: 800 mg via ORAL
  Filled 2015-10-05: qty 1

## 2015-10-05 MED ORDER — OXYCODONE HCL 5 MG PO TABS
10.0000 mg | ORAL_TABLET | Freq: Once | ORAL | Status: AC
  Administered 2015-10-05: 10 mg via ORAL
  Filled 2015-10-05: qty 2

## 2015-10-05 NOTE — ED Notes (Signed)
Pt stable, ambulatory, states understanding of discharge instructions 

## 2015-10-05 NOTE — ED Provider Notes (Signed)
CSN: [id number]     Arrival date & time 10/05/15  2013 History   First MD Initiated Contact with Patient 10/05/15 2057     Chief Complaint  Patient presents with  . Fall     (Consider location/radiation/quality/duration/timing/severity/associated sxs/prior Treatment) HPI  65 year old female presents after a fall at a function. She was sitting down and into a chair. When she sat down the chair broke she fell backwards, striking her head on a corner of a wall. She hit the right side of her head. She lost consciousness for a few seconds. Since then she has had a severe headache that has slightly improved since onset about 1-1/2 hours ago. No nausea or vomiting. No blurry vision. She's also complaining of right middle finger pain and swelling as well as low back pain. No weakness or numbness. She states a few years ago she had a retinal detachment after accidentally hitting her head on a car trunk. She was very hyperaware this time but states she has definitely not had any blurry vision or ocular pain.  Past Medical History  Diagnosis Date  . Diabetes mellitus   . Hypothyroidism   . Hypertension   . Hypercholesteremia   . Vitamin D deficiency   . TIA (transient ischemic attack) 05/2007  . Melanoma ([place]) 8/06 L cheek(clarks level 2) DrTafeen  . Colon polyps   . Elevated LFTs Hayes    lobular hepatitis and fibrosis on biopsy  . GERD (gastroesophageal reflux disease)   . Sleep apnea, obstructive severe,02/2010    not using CPAP  . Heart murmur   . TIA (transient ischemic attack) 2009  . Leukocytosis 07/06/2014  . Deficiency anemia 07/06/2014   Past Surgical History  Procedure Laterality Date  . Cesarean section  x 1; 1987  . Breast surgery  benign breast tumor removed 1974  . Melanoma excision  L cheek 8/06  . Hemorrhoid surgery  external  . Colonoscopy  DrHayes  . Dilation and curettage of uterus  11/2007  . Cardiolite stress  normal  11/04 (Dr. Jaci Standard)  . Cataract extraction  w/phaco  11/28/2011    Procedure: CATARACT EXTRACTION PHACO AND INTRAOCULAR LENS PLACEMENT (IOC);  Surgeon: Elta Guadeloupe T. Gershon Crane, MD;  Location: [facility];  Service: Ophthalmology;  Laterality: Left;  CDE 6.72   Family History  Problem Relation Age of Onset  . Stroke Mother   . Arthritis Mother     rheumatoid arthritis  . Hyperlipidemia Brother   . Hypertension Brother   . Diabetes Brother    Social History  Substance Use Topics  . Smoking status: Former Smoker -- 0.10 packs/day    Types: Cigarettes    Quit date: 11/18/2012  . Smokeless tobacco: Never Used     Comment: quit smoking 2006; recently restarted due to stressors; quit again 11/2012  . Alcohol Use: Yes     Comment: 1 drink once a year   OB History    No data available     Review of Systems  Eyes: Negative for pain and visual disturbance.  Gastrointestinal: Negative for nausea and vomiting.  Musculoskeletal: Positive for back pain and arthralgias. Negative for neck pain.  Neurological: Positive for headaches. Negative for dizziness, weakness, light-headedness and numbness.  All other systems reviewed and are negative.     Allergies  Ace inhibitors; Acetaminophen; and Metformin and related  Home Medications   Prior to Admission medications   Medication Sig Start Date End Date Taking? Authorizing Provider  aspirin 325 MG EC tablet  Take 325 mg by mouth daily.   Yes Historical Provider, MD  atorvastatin (LIPITOR) 20 MG tablet Take 20 mg by mouth daily.   Yes Historical Provider, MD  ibuprofen (ADVIL,MOTRIN) 200 MG tablet Take 200 mg by mouth every 8 (eight) hours as needed for headache.    Yes Historical Provider, MD  metFORMIN (GLUMETZA) 500 MG (MOD) 24 hr tablet Take 1,000 mg by mouth 2 (two) times daily with a meal.    Yes Historical Provider, MD  SYNTHROID 125 MCG tablet Take 1 tablet (125 mcg total) by mouth daily. 03/31/13  Yes Rita Ohara, MD   BP 139/72 mmHg  Pulse 69  SpO2 97% Physical Exam  Constitutional: She  is oriented to person, place, and time. She appears well-developed and well-nourished. No distress.  Obese Resting comfortably, no distress  HENT:  Head: Normocephalic and atraumatic.  Right Ear: External ear normal.  Left Ear: External ear normal.  Nose: Nose normal.  Eyes: EOM are normal. Pupils are equal, round, and reactive to light. Right eye exhibits no discharge. Left eye exhibits no discharge.  Neck: Normal range of motion. Neck supple. No spinous process tenderness and no muscular tenderness present.  Cardiovascular: Normal rate, regular rhythm and normal heart sounds.   Pulmonary/Chest: Effort normal and breath sounds normal.  Abdominal: Soft. There is no tenderness.  Musculoskeletal:       Right wrist: She exhibits normal range of motion and no tenderness.       Left wrist: She exhibits normal range of motion and no tenderness.       Cervical back: She exhibits no tenderness.       Thoracic back: She exhibits no tenderness.       Lumbar back: She exhibits tenderness.       Right forearm: She exhibits no tenderness.       Left forearm: She exhibits no tenderness.       Right hand: She exhibits tenderness and swelling.       Hands: Neurological: She is alert and oriented to person, place, and time.  CN 2-12 grossly intact. 5/5 strength in all 4 extremities. Normal gross sensation  Skin: Skin is warm and dry. She is not diaphoretic.  Nursing note and vitals reviewed.   ED Course  Procedures (including critical care time) Labs Review Labs Reviewed - No data to display  Imaging Review Dg Lumbar Spine Complete  10/05/2015  CLINICAL DATA:  Pain following fall EXAM: LUMBAR SPINE - COMPLETE 4+ VIEW COMPARISON:  None. FINDINGS: Frontal, lateral, spot lumbosacral lateral, and bilateral oblique views obtained. There are 5 non-rib-bearing lumbar type vertebral bodies. There is no fracture or spondylolisthesis. There is mild disc space narrowing at L4-5 and L5-S1. There is also  moderate disc space narrowing at T10-11. There are anterior osteophytes at T11 and T12. There is facet osteoarthritic change at L4-5 and L5-S1 bilaterally. There is a calcified uterine leiomyoma in the right pelvis measuring 4.0 x 3.5 cm. IMPRESSION: Areas of osteoarthritic change. No fracture or spondylolisthesis. Calcified uterine leiomyoma right pelvis. Electronically Signed   By: Lowella Grip III M.D.   On: 10/05/2015 21:38   Ct Head Wo Contrast  10/05/2015  CLINICAL DATA:  [place] and hit head on pillar while attempting to sit in a chair at [facility], when back of chair broke. Loss of consciousness, and headache, with right-sided head pain. Initial encounter. EXAM: CT HEAD WITHOUT CONTRAST TECHNIQUE: Contiguous axial images were obtained from the base of the skull  through the vertex without intravenous contrast. COMPARISON:  CT of the head performed 01/15/2008, and MRI of the brain performed 11/12/2012 FINDINGS: There is no evidence of acute infarction, mass lesion, or intra- or extra-axial hemorrhage on CT. Mild periventricular white matter change likely reflects small vessel ischemic microangiopathy. The posterior fossa, including the cerebellum, brainstem and fourth ventricle, is within normal limits. The third and lateral ventricles, and basal ganglia are unremarkable in appearance. The cerebral hemispheres are symmetric in appearance, with normal gray-white differentiation. No mass effect or midline shift is seen. There is no evidence of fracture; visualized osseous structures are unremarkable in appearance. The orbits are within normal limits. The paranasal sinuses and mastoid air cells are well-aerated. No significant soft tissue abnormalities are seen. IMPRESSION: 1. No evidence of traumatic intracranial injury or fracture. 2. Mild small vessel ischemic microangiopathy. Electronically Signed   By: Garald Balding M.D.   On: 10/05/2015 21:58   Dg Finger Middle Right  10/05/2015  CLINICAL DATA:   65 year old female with fall and right middle finger pain. EXAM: RIGHT MIDDLE FINGER 2+V COMPARISON:  None. FINDINGS: There is no acute fracture. There is osteoarthritic changes with narrowing of the PIP and DIP joint spaces. No dislocation. There is diffuse soft tissue swelling of the third digit. No radiopaque foreign object. IMPRESSION: No acute fracture or dislocation. Electronically Signed   By: Anner Crete M.D.   On: 10/05/2015 21:40   I have personally reviewed and evaluated these images and lab results as part of my medical decision-making.   EKG Interpretation None      MDM   Final diagnoses:  None    Patient's pain is significantly improved. Her CT scan shows no skull fracture or intracranial bleeding. She is on an aspirin but no other blood thinners. Normal neuro exam. No neck pain to suggest needing neck imaging. Finger is consistent with a sprain given negative x-ray. Low suspicion for severe lumbar injury negative x-ray, will treat her with NSAIDs. She declines further pain medicine as an outpatient. Discussed strict return precautions and recommend follow-up with PCP.    Sherwood Gambler, MD 10/05/15 [phone]

## 2015-10-05 NOTE — ED Notes (Signed)
Pt arrived via EMS c/o fall. Pt states the back broke off her chair and she hit the floor, LOC.

## 2015-11-01 ENCOUNTER — Other Ambulatory Visit: Payer: Self-pay | Admitting: Obstetrics and Gynecology

## 2015-11-01 DIAGNOSIS — N762 Acute vulvitis: Secondary | ICD-10-CM

## 2015-11-01 DIAGNOSIS — N763 Subacute and chronic vulvitis: Secondary | ICD-10-CM | POA: Diagnosis not present

## 2015-11-01 HISTORY — DX: Acute vulvitis: N76.2

## 2015-11-01 HISTORY — PX: OTHER SURGICAL HISTORY: SHX169

## 2015-11-10 MED FILL — SYNTHROID 125 MCG TABLET: 125 | 90 days supply | Qty: 90 | Fill #1

## 2015-11-10 MED FILL — CLOBETASOL 0.05% OINTMENT: 0.05 | 30 days supply | Qty: 60 | Fill #0

## 2015-11-18 ENCOUNTER — Other Ambulatory Visit: Payer: Self-pay | Admitting: *Deleted

## 2015-11-18 ENCOUNTER — Encounter: Payer: Self-pay | Admitting: *Deleted

## 2015-11-18 NOTE — Patient Outreach (Addendum)
[facility]) Care Management   11/18/2015  [name] 1951-03-13 [id number]  [name] is an 65 y.o. female who presents to the [place] Management office for routine Link To Wellness follow up for self management assistance with Type II DM, HTN and hyperlipidemia.  Subjective:  [name] is complaining of right eye irritation and states she feels as though there is something in her eye. She request that this RNCM exam her eye for a foreign body. [name] says her weight is up because she went on a cruise in early January and has not been eating well since returning. She also says she has not been taking her medications except her thyroid medicine because she doesn't like to take medications. She says she had an accident on 1/17 when a chair broke and she fell and hit her head on the edge of a column. She was evaluated in the ED and was without serious injury. She says the pain in her left wrist was associated with a ganglion cyst and once it went away the pain stopped.  She says she saw a gyn MD in Feb and had a biopsy of her labium that was negative. After serious discussion about the need for improved medication adherence, she agrees to take her DM medications. She says she has been under increased stress because she wants to move due to increased crime in her neighborhood.  Objective:   Review of Systems  Constitutional: Negative.     Physical Exam  Constitutional: She is oriented to person, place, and time. She appears well-developed and well-nourished.  Eyes:    Respiratory: Effort normal.  Neurological: She is alert and oriented to person, place, and time.  Skin: Skin is warm and dry.  Psychiatric: She has a normal mood and affect. Her behavior is normal. Judgment and thought content normal.   Filed Weights   11/18/15 1030  Weight: 240 lb 3.2 oz (108.954 kg)   Filed Vitals:   11/18/15 1030  BP: 120/80     Current Medications:   Current Outpatient Prescriptions     Refill                  1                        1   No current facility-administered medications for this visit.    Functional Status:   In your present state of health, do you have any difficulty performing the following activities: 11/18/2015 02/03/2015  Hearing? N N  Vision? N N  Difficulty concentrating or making decisions? N N  Walking or climbing stairs? N N  Dressing or bathing? N N  Doing errands, shopping? N N    Fall/Depression Screening:    PHQ 2/9 Scores 02/03/2015  PHQ - 2 Score 6  PHQ- 9 Score 14   Current medications  Outpatient Encounter Prescriptions as of 11/18/2015  Medication Sig  . aspirin 325 MG EC tablet Take 325 mg by mouth daily.  . clobetasol cream (TEMOVATE) [id number] % Apply 1 application topically 2 (two) times daily.  Marland Kitchen ibuprofen (ADVIL,MOTRIN) 200 MG tablet Take 200 mg by mouth every 8 (eight) hours as needed for headache.   Marland Kitchen SYNTHROID 125 MCG tablet Take 1 tablet (125 mcg total) by mouth daily.  Marland Kitchen atorvastatin (LIPITOR) 20 MG tablet Take 20 mg by mouth daily. Reported on 11/18/2015  . metFORMIN (GLUMETZA) 500 MG (MOD)  24 hr tablet Take 1,000 mg by mouth 2 (two) times daily with a meal. Reported on 11/18/2015- not taking  . sitaGLIPtin (JANUVIA) 50 MG tablet Take 50 mg by mouth daily.- not taking  . telmisartan-hydrochlorothiazide (MICARDIS HCT) 80-25 MG tablet Take 1 tablet by mouth daily.- not taking   No facility-administered encounter medications on file as of 11/18/2015.         Assessment:   [employer] employee and Link To Wellness member with HTN, Type II DM and Hyperlipidemia not meeting treatment targets for DM and lipids due to ongoing medication nonadherence.  Plan:   [facility] CM Care Plan Problem One        Most Recent Value   Care Plan Problem One  Type II DM, HTN and hyperlipidemia- not meeting Hgb A1C target and lipid targets secondary to medication nonadherence    Role Documenting the Problem One  Care Management [place] for Problem One  Active   [facility] Long Term Goal (31-90 days)  Improved glycemic control as evidenced by improvement in Hgb A1C to <8.0% and improved medication adherence as evidenced by [name] reporting that she has taken her diabetes medicine at least 75% of the time at/by next Link To Wellness visit    [place] Term Goal Start Date  11/18/15   Interventions for Problem One Long Term Goal  updated health history to include 1/17 chair accident and labium biopsy and results,  assessed medications and medication adherence, discussed mechanism of action of Metformin and Januvia, again discussed the importance of medication adherence to improve glycemic control and lipids, again encouraged her to be honest with Dr. Jacelyn Grip about her medication adherence, reviewed her labs drawn on 09/06/15 and reviewed information on how to improve her lipid panel, provided her with powerpoint information on estate planning and advanced directives, will arrange for Link To Wellness follow up after she sess Dr. Jacelyn Grip on 12/09/15        RNCM to fax today's office visit note to Dr. Jacelyn Grip. RNCM will meet quarterly and as needed with patient per Link To Wellness program guidelines to assist with Type II DM, HTN and hyperlipidemia self-management and assess patient's progress toward mutually set goals.  Barrington Ellison RN,CCM,CDE [place] Management Coordinator Link To Wellness Office Phone [phone] Office Fax [phone]

## 2015-12-29 MED FILL — metFORMIN HCL 500 MG TABS: 500 | 90 days supply | Qty: 180 | Fill #1

## 2015-12-29 MED FILL — TELMISARTAN-HCTZ 80-25 MG T: 80-25 | 90 days supply | Qty: 90 | Fill #1

## 2015-12-29 MED FILL — ATORVASTATIN 20 MG TABLET: 20 | 90 days supply | Qty: 90 | Fill #1

## 2016-02-22 ENCOUNTER — Encounter (HOSPITAL_COMMUNITY): Payer: Self-pay | Admitting: *Deleted

## 2016-02-22 ENCOUNTER — Emergency Department (HOSPITAL_COMMUNITY)
Admission: EM | Admit: 2016-02-22 | Discharge: 2016-02-23 | Disposition: A | Discharge status: Home / Self Care | Payer: 59 | Attending: Emergency Medicine | Admitting: Emergency Medicine

## 2016-02-22 DIAGNOSIS — R197 Diarrhea, unspecified: Secondary | ICD-10-CM | POA: Diagnosis not present

## 2016-02-22 DIAGNOSIS — Z7984 Long term (current) use of oral hypoglycemic drugs: Secondary | ICD-10-CM | POA: Insufficient documentation

## 2016-02-22 DIAGNOSIS — E1165 Type 2 diabetes mellitus with hyperglycemia: Secondary | ICD-10-CM | POA: Diagnosis not present

## 2016-02-22 DIAGNOSIS — R112 Nausea with vomiting, unspecified: Secondary | ICD-10-CM | POA: Diagnosis not present

## 2016-02-22 DIAGNOSIS — E119 Type 2 diabetes mellitus without complications: Secondary | ICD-10-CM | POA: Insufficient documentation

## 2016-02-22 DIAGNOSIS — Z8673 Personal history of transient ischemic attack (TIA), and cerebral infarction without residual deficits: Secondary | ICD-10-CM | POA: Insufficient documentation

## 2016-02-22 DIAGNOSIS — A084 Viral intestinal infection, unspecified: Secondary | ICD-10-CM | POA: Insufficient documentation

## 2016-02-22 DIAGNOSIS — Z87891 Personal history of nicotine dependence: Secondary | ICD-10-CM | POA: Insufficient documentation

## 2016-02-22 DIAGNOSIS — I10 Essential (primary) hypertension: Secondary | ICD-10-CM | POA: Diagnosis not present

## 2016-02-22 DIAGNOSIS — R81 Glycosuria: Secondary | ICD-10-CM | POA: Diagnosis not present

## 2016-02-22 DIAGNOSIS — Z9114 Patient's other noncompliance with medication regimen: Secondary | ICD-10-CM | POA: Insufficient documentation

## 2016-02-22 DIAGNOSIS — Z7982 Long term (current) use of aspirin: Secondary | ICD-10-CM | POA: Insufficient documentation

## 2016-02-22 DIAGNOSIS — R399 Unspecified symptoms and signs involving the genitourinary system: Secondary | ICD-10-CM | POA: Diagnosis not present

## 2016-02-22 DIAGNOSIS — R509 Fever, unspecified: Secondary | ICD-10-CM | POA: Diagnosis present

## 2016-02-22 LAB — CBG MONITORING, ED: Glucose-Capillary: 304 mg/dL — ABNORMAL HIGH (ref 65–99)

## 2016-02-22 MED ORDER — ONDANSETRON HCL 4 MG/2ML IJ SOLN
4.0000 mg | Freq: Once | INTRAMUSCULAR | Status: AC | PRN
  Administered 2016-02-23: 4 mg via INTRAVENOUS
  Filled 2016-02-22: qty 2

## 2016-02-22 NOTE — ED Notes (Signed)
Pt c/o urinary frequency and urgency over the last couple of weeks; pt states that she began having N/V/D this afternoon around 3pm; pt was seen by OB / Gyn this afternoon and was advised BS elevated and that may have a virus; pt states that she has vomited x 2 this evening and had several episodes of diarrhea; pt states that she was unable to hold down chicken soup to take ibuprofen; pt c/o fever and body aches; O2 sat 88-91% on room air in triage

## 2016-02-23 ENCOUNTER — Emergency Department (HOSPITAL_COMMUNITY): Payer: 59

## 2016-02-23 DIAGNOSIS — A084 Viral intestinal infection, unspecified: Secondary | ICD-10-CM | POA: Diagnosis not present

## 2016-02-23 DIAGNOSIS — Z9114 Patient's other noncompliance with medication regimen: Secondary | ICD-10-CM | POA: Diagnosis not present

## 2016-02-23 DIAGNOSIS — E119 Type 2 diabetes mellitus without complications: Secondary | ICD-10-CM | POA: Diagnosis not present

## 2016-02-23 DIAGNOSIS — Z87891 Personal history of nicotine dependence: Secondary | ICD-10-CM | POA: Diagnosis not present

## 2016-02-23 DIAGNOSIS — R509 Fever, unspecified: Secondary | ICD-10-CM | POA: Diagnosis not present

## 2016-02-23 DIAGNOSIS — Z7982 Long term (current) use of aspirin: Secondary | ICD-10-CM | POA: Diagnosis not present

## 2016-02-23 DIAGNOSIS — Z7984 Long term (current) use of oral hypoglycemic drugs: Secondary | ICD-10-CM | POA: Diagnosis not present

## 2016-02-23 DIAGNOSIS — I10 Essential (primary) hypertension: Secondary | ICD-10-CM | POA: Diagnosis not present

## 2016-02-23 DIAGNOSIS — Z8673 Personal history of transient ischemic attack (TIA), and cerebral infarction without residual deficits: Secondary | ICD-10-CM | POA: Diagnosis not present

## 2016-02-23 LAB — COMPREHENSIVE METABOLIC PANEL
ALK PHOS: 64 U/L (ref 38–126)
ALT: 83 U/L — ABNORMAL HIGH (ref 14–54)
ANION GAP: 12 (ref 5–15)
AST: 53 U/L — ABNORMAL HIGH (ref 15–41)
Albumin: 3.9 g/dL (ref 3.5–5.0)
BILIRUBIN TOTAL: 1.3 mg/dL — AB (ref 0.3–1.2)
BUN: 18 mg/dL (ref 6–20)
CALCIUM: 9 mg/dL (ref 8.9–10.3)
CO2: 23 mmol/L (ref 22–32)
Chloride: 97 mmol/L — ABNORMAL LOW (ref 101–111)
Creatinine, Ser: 0.6 mg/dL (ref 0.44–1.00)
GFR calc non Af Amer: >60 mL/min (ref >60)
Glucose, Bld: 308 mg/dL — ABNORMAL HIGH (ref 65–99)
POTASSIUM: 3.9 mmol/L (ref 3.5–5.1)
SODIUM: 132 mmol/L — AB (ref 135–145)
TOTAL PROTEIN: 7.4 g/dL (ref 6.5–8.1)

## 2016-02-23 LAB — URINALYSIS, ROUTINE W REFLEX MICROSCOPIC
BILIRUBIN URINE: NEGATIVE
Glucose, UA: >1000 mg/dL — AB
Hgb urine dipstick: NEGATIVE
Ketones, ur: NEGATIVE mg/dL
LEUKOCYTES UA: NEGATIVE
NITRITE: NEGATIVE
Protein, ur: NEGATIVE mg/dL
SPECIFIC GRAVITY, URINE: 1.046 — AB (ref 1.005–1.030)
pH: 5 (ref 5.0–8.0)

## 2016-02-23 LAB — URINE MICROSCOPIC-ADD ON

## 2016-02-23 LAB — CBC
HCT: 43.4 % (ref 36.0–46.0)
HEMOGLOBIN: 14 g/dL (ref 12.0–15.0)
MCH: 27.8 pg (ref 26.0–34.0)
MCHC: 32.3 g/dL (ref 30.0–36.0)
MCV: 86.3 fL (ref 78.0–100.0)
Platelets: 266 10*3/uL (ref 150–400)
RBC: 5.03 MIL/uL (ref 3.87–5.11)
RDW: 14.8 % (ref 11.5–15.5)
WBC: 12.7 10*3/uL — AB (ref 4.0–10.5)

## 2016-02-23 LAB — LIPASE, BLOOD: Lipase: 20 U/L (ref 11–51)

## 2016-02-23 MED ORDER — FLUCONAZOLE 150 MG PO TABS
150.0000 mg | ORAL_TABLET | Freq: Once | ORAL | Status: AC
  Administered 2016-02-23: 150 mg via ORAL
  Filled 2016-02-23: qty 1

## 2016-02-23 MED ORDER — INSULIN ASPART 100 UNIT/ML ~~LOC~~ SOLN
5.0000 [IU] | Freq: Once | SUBCUTANEOUS | Status: AC
  Administered 2016-02-23: 5 [IU] via SUBCUTANEOUS
  Filled 2016-02-23: qty 1

## 2016-02-23 MED ORDER — ONDANSETRON HCL 8 MG PO TABS: 8.0000 mg | ORAL_TABLET | Freq: Three times a day (TID) | ORAL | Status: DC | PRN

## 2016-02-23 MED ORDER — SODIUM CHLORIDE 0.9 % IV BOLUS (SEPSIS)
1000.0000 mL | Freq: Once | INTRAVENOUS | Status: AC
  Administered 2016-02-23: 1000 mL via INTRAVENOUS

## 2016-02-23 MED ORDER — KETOROLAC TROMETHAMINE 15 MG/ML IJ SOLN
INTRAMUSCULAR | Status: AC
  Filled 2016-02-23: qty 1

## 2016-02-23 MED ORDER — KETOROLAC TROMETHAMINE 15 MG/ML IJ SOLN
10.0000 mg | Freq: Once | INTRAMUSCULAR | Status: AC
  Administered 2016-02-23: 10 mg via INTRAVENOUS

## 2016-02-23 MED FILL — ONDANSETRON HCL 8 MG TABLET: 8 | 3 days supply | Qty: 10 | Fill #0

## 2016-02-23 NOTE — ED Notes (Signed)
I&O cath unnecessary, pt able to obtain clean catch urine sample,

## 2016-02-23 NOTE — ED Provider Notes (Signed)
CSN: [id number]     Arrival date & time 02/22/16  2149 History  By signing my name below, I, Georgette Shell, attest that this documentation has been prepared under the direction and in the presence of Shanon Rosser, MD. Electronically Signed: Georgette Shell, ED Scribe. 02/23/2016. 12:54 AM.   Chief Complaint  Patient presents with  . Fever   The history is provided by the patient and a relative. No language interpreter was used.    HPI Comments: [name] is a 65 y.o. female with h/o DM who presents to the Emergency Department complaining of fever (Tmax 101.1) onset yesterday at [facility] with associated cramping abdominal pain, generalized myalgias, nausea, multiple episodes of vomiting and diarrhea. Patient was given IV Zofran in the ED with significant improvement to her nausea. Per pt, she has been experiencing ongoing dysuria and urinary frequency for several weeks which worsened yesterday. She had an appointment with her OB/GYN to be evaluated for this complaint yesterday at 4PM. Per pt, her UA at the OB/GYN was negative for UTI.  Per daughter, pt is not compliant with her DM medication. Patient is not a smoker. Pt denies shortness of breath.  Past Medical History  Diagnosis Date  . Diabetes mellitus   . Hypothyroidism   . Hypertension   . Hypercholesteremia   . Vitamin D deficiency   . TIA (transient ischemic attack) 05/2007  . Melanoma ([place]) 8/06 L cheek(clarks level 2) DrTafeen  . Colon polyps   . Elevated LFTs Hayes    lobular hepatitis and fibrosis on biopsy  . GERD (gastroesophageal reflux disease)   . Sleep apnea, obstructive severe,02/2010    not using CPAP  . Heart murmur   . TIA (transient ischemic attack) 2009  . Leukocytosis 07/06/2014  . Deficiency anemia 07/06/2014  . Inflammation of labium 11/01/15    with benign biopsy of right labium   Past Surgical History  Procedure Laterality Date  . Cesarean section  x 1; 1987  . Breast surgery  benign breast tumor removed 1974  .  Melanoma excision  L cheek 8/06  . Hemorrhoid surgery  external  . Colonoscopy  DrHayes  . Dilation and curettage of uterus  11/2007  . Cardiolite stress  normal  11/04 (Dr. Jaci Standard)  . Cataract extraction w/phaco  11/28/2011    Procedure: CATARACT EXTRACTION PHACO AND INTRAOCULAR LENS PLACEMENT (IOC);  Surgeon: Elta Guadeloupe T. Gershon Crane, MD;  Location: [facility];  Service: Ophthalmology;  Laterality: Left;  CDE 6.72  . Other surgical history Left 11/01/15    left labium   Family History  Problem Relation Age of Onset  . Stroke Mother   . Arthritis Mother     rheumatoid arthritis  . Hyperlipidemia Brother   . Hypertension Brother   . Diabetes Brother    Social History  Substance Use Topics  . Smoking status: Former Smoker -- 0.10 packs/day    Types: Cigarettes    Quit date: 11/18/2012  . Smokeless tobacco: Never Used     Comment: quit smoking 2006; recently restarted due to stressors; quit again 11/2012  . Alcohol Use: Yes     Comment: 1 drink once a year   OB History    No data available     Review of Systems A complete 10 system review of systems was obtained and all systems are negative except as noted in the HPI and PMH.   Allergies  Ace inhibitors; Acetaminophen; and Metformin and related  Home Medications   Prior  to Admission medications   Medication Sig Start Date End Date Taking? Authorizing Provider  aspirin 325 MG EC tablet Take 325 mg by mouth daily.   Yes Historical Provider, MD  ergocalciferol (VITAMIN D2) 50000 units capsule Take 50,000 Units by mouth once a week.   Yes Historical Provider, MD  ibuprofen (ADVIL,MOTRIN) 200 MG tablet Take 200 mg by mouth every 8 (eight) hours as needed for headache. Reported on 02/22/2016   Yes Historical Provider, MD  metFORMIN (GLUCOPHAGE) 500 MG tablet Take 500 mg by mouth 2 (two) times daily. 12/29/15  Yes Historical Provider, MD  SYNTHROID 125 MCG tablet Take 1 tablet (125 mcg total) by mouth daily. 03/31/13  Yes Rita Ohara, MD   telmisartan-hydrochlorothiazide (MICARDIS HCT) 80-25 MG tablet Take 1 tablet by mouth daily.   Yes Historical Provider, MD   BP 129/59 mmHg  Pulse 105  Temp(Src) 101.1 F (38.4 C) (Oral)  Resp 18  Ht 5\' 5"  (1.651 m)  Wt 239 lb (108.41 kg)  BMI 39.77 kg/m2  SpO2 93%   Physical Exam  General: Well-developed, well-nourished female in no acute distress; appearance consistent with age of record HENT: normocephalic; atraumatic Eyes: pupils equal, round and reactive to light; extraocular muscles intact; lens implants Neck: supple Heart: regular rhythm; tachycardia Lungs: clear to auscultation bilaterally Abdomen: soft; nondistended; nontender; no masses or hepatosplenomegaly; bowel sounds present Extremities: No deformity; full range of motion; pulses normal Neurologic: Awake, alert and oriented; motor function intact in all extremities and symmetric; no facial droop Skin: Warm and dry Psychiatric: Normal mood and affect   ED Course  Procedures (including critical care time)   MDM   Nursing notes and vitals signs, including pulse oximetry, reviewed.  Summary of this visit's results, reviewed by myself:  Labs:  Results for orders placed or performed during the hospital encounter of 02/22/16 (from the past 24 hour(s))  CBG monitoring, ED     Status: Abnormal   Collection Time: 02/22/16 11:12 PM  Result Value Ref Range   Glucose-Capillary 304 (H) 65 - 99 mg/dL  Lipase, blood     Status: None   Collection Time: 02/22/16 11:39 PM  Result Value Ref Range   Lipase 20 11 - 51 U/L  Comprehensive metabolic panel     Status: Abnormal   Collection Time: 02/22/16 11:39 PM  Result Value Ref Range   Sodium 132 (L) 135 - 145 mmol/L   Potassium 3.9 3.5 - 5.1 mmol/L   Chloride 97 (L) 101 - 111 mmol/L   CO2 23 22 - 32 mmol/L   Glucose, Bld 308 (H) 65 - 99 mg/dL   BUN 18 6 - 20 mg/dL   Creatinine, Ser 0.60 0.44 - 1.00 mg/dL   Calcium 9.0 8.9 - 10.3 mg/dL   Total Protein 7.4 6.5 - 8.1  g/dL   Albumin 3.9 3.5 - 5.0 g/dL   AST 53 (H) 15 - 41 U/L   ALT 83 (H) 14 - 54 U/L   Alkaline Phosphatase 64 38 - 126 U/L   Total Bilirubin 1.3 (H) 0.3 - 1.2 mg/dL   GFR calc non Af Amer >60 >60 mL/min   GFR calc Af Amer >60 >60 mL/min   Anion gap 12 5 - 15  CBC     Status: Abnormal   Collection Time: 02/22/16 11:39 PM  Result Value Ref Range   WBC 12.7 (H) 4.0 - 10.5 K/uL   RBC 5.03 3.87 - 5.11 MIL/uL   Hemoglobin 14.0 12.0 - 15.0  g/dL   HCT 43.4 36.0 - 46.0 %   MCV 86.3 78.0 - 100.0 fL   MCH 27.8 26.0 - 34.0 pg   MCHC 32.3 30.0 - 36.0 g/dL   RDW 14.8 11.5 - 15.5 %   Platelets 266 150 - 400 K/uL  Urinalysis, Routine w reflex microscopic     Status: Abnormal   Collection Time: 02/23/16 12:04 AM  Result Value Ref Range   Color, Urine YELLOW YELLOW   APPearance CLEAR CLEAR   Specific Gravity, Urine 1.046 (H) 1.005 - 1.030   pH 5.0 5.0 - 8.0   Glucose, UA >1000 (A) NEGATIVE mg/dL   Hgb urine dipstick NEGATIVE NEGATIVE   Bilirubin Urine NEGATIVE NEGATIVE   Ketones, ur NEGATIVE NEGATIVE mg/dL   Protein, ur NEGATIVE NEGATIVE mg/dL   Nitrite NEGATIVE NEGATIVE   Leukocytes, UA NEGATIVE NEGATIVE  Urine microscopic-add on     Status: Abnormal   Collection Time: 02/23/16 12:04 AM  Result Value Ref Range   Squamous Epithelial / LPF 6-30 (A) NONE SEEN   WBC, UA 0-5 0 - 5 WBC/hpf   RBC / HPF 0-5 0 - 5 RBC/hpf   Bacteria, UA RARE (A) NONE SEEN   Urine-Other YEAST PRESENT     Imaging Studies: Dg Chest 2 View  02/23/2016  CLINICAL DATA:  Initial evaluation for acute fever. EXAM: CHEST  2 VIEW COMPARISON:  Prior radiograph from 01/29/2013. FINDINGS: Cardiac and mediastinal silhouettes are within normal limits para Lungs are hypoinflated. Patchy and linear bibasilar opacities favored to reflect atelectasis. No definite focal infiltrates. No pulmonary edema or pleural effusion. No pneumothorax. No acute osseus abnormality. IMPRESSION: Shallow lung inflation with mild bibasilar atelectasis.  No other active cardiopulmonary disease identified. Electronically Signed   By: Jeannine Boga M.D.   On: 02/23/2016 00:48   2:13 AM Drinking fluids without emesis. Feels much better. Mildly elevated transaminases are consistent with the patient's baseline. She was advised of the importance of taking her medications as prescribed.  Final diagnoses:  Viral gastroenteritis  Noncompliance with medications   I personally performed the services described in this documentation, which was scribed in my presence. The recorded information has been reviewed and is accurate.    Shanon Rosser, MD 02/23/16 [phone]

## 2016-02-23 NOTE — ED Notes (Signed)
Pt states that she feels much better.

## 2016-02-23 NOTE — ED Notes (Signed)
Pt ambulated to restroom. 

## 2016-02-23 NOTE — Discharge Instructions (Signed)

## 2016-02-23 NOTE — ED Notes (Signed)
MD at bedside. 

## 2016-02-24 DIAGNOSIS — E785 Hyperlipidemia, unspecified: Secondary | ICD-10-CM | POA: Diagnosis not present

## 2016-02-24 DIAGNOSIS — Z6838 Body mass index (BMI) 38.0-38.9, adult: Secondary | ICD-10-CM | POA: Diagnosis not present

## 2016-02-24 DIAGNOSIS — K529 Noninfective gastroenteritis and colitis, unspecified: Secondary | ICD-10-CM | POA: Diagnosis not present

## 2016-02-24 DIAGNOSIS — I10 Essential (primary) hypertension: Secondary | ICD-10-CM | POA: Diagnosis not present

## 2016-02-24 DIAGNOSIS — R945 Abnormal results of liver function studies: Secondary | ICD-10-CM | POA: Diagnosis not present

## 2016-02-24 DIAGNOSIS — E559 Vitamin D deficiency, unspecified: Secondary | ICD-10-CM | POA: Diagnosis not present

## 2016-02-24 DIAGNOSIS — E119 Type 2 diabetes mellitus without complications: Secondary | ICD-10-CM | POA: Diagnosis not present

## 2016-02-24 DIAGNOSIS — E039 Hypothyroidism, unspecified: Secondary | ICD-10-CM | POA: Diagnosis not present

## 2016-02-24 DIAGNOSIS — D72829 Elevated white blood cell count, unspecified: Secondary | ICD-10-CM | POA: Diagnosis not present

## 2016-03-06 DIAGNOSIS — R899 Unspecified abnormal finding in specimens from other organs, systems and tissues: Secondary | ICD-10-CM | POA: Diagnosis not present

## 2016-03-06 DIAGNOSIS — E039 Hypothyroidism, unspecified: Secondary | ICD-10-CM | POA: Diagnosis not present

## 2016-03-06 DIAGNOSIS — E119 Type 2 diabetes mellitus without complications: Secondary | ICD-10-CM | POA: Diagnosis not present

## 2016-03-06 DIAGNOSIS — E559 Vitamin D deficiency, unspecified: Secondary | ICD-10-CM | POA: Diagnosis not present

## 2016-03-06 DIAGNOSIS — Z7984 Long term (current) use of oral hypoglycemic drugs: Secondary | ICD-10-CM | POA: Diagnosis not present

## 2016-03-06 DIAGNOSIS — I10 Essential (primary) hypertension: Secondary | ICD-10-CM | POA: Diagnosis not present

## 2016-03-07 ENCOUNTER — Other Ambulatory Visit: Payer: Self-pay | Admitting: *Deleted

## 2016-03-07 NOTE — Patient Outreach (Signed)
Spoke with [name]. She was recently in the ED (02/22/16)for severe dehydration and told  The ED staff she was out of her DM medicines. Her blood sugar in the ED was 308.  She saw Dr. Jacelyn Grip in follow up on 02/24/16 and her Hgb  A1C was 10.1.% She will see him again in August so Link To Wellness visit scheduled for 04/11/16 to ensure she is managing her DM appropriately. Barrington Ellison RN,CCM,CDE [place] Management Coordinator Link To Wellness Office Phone [phone] Office Fax [phone]

## 2016-04-11 ENCOUNTER — Other Ambulatory Visit: Payer: Self-pay | Admitting: *Deleted

## 2016-04-11 NOTE — Patient Outreach (Signed)
[place] [facility]) Care Management   04/11/2016  [name] [birth year] [id number]  [name] is an 65 y.o. female who presents to the [place] Management office for routine Link To Wellness follow up for self management assistance with Type II DM, HTN and hyperlipidemia.  Subjective: [name] says she is under stress related to her work but her home life with her daughter [name] is good.  She says since her ED visit on 6/6 and the ED MD chastising her for medication nonadherence she has been adherent with her medications except for her statin which she forgets to take often because she was told to take it at night.   Objective:   Review of Systems  All other systems reviewed and are negative.   Physical Exam  Constitutional: She is oriented to person, place, and time. She appears well-developed and well-nourished.  Respiratory: Effort normal.  Neurological: She is alert and oriented to person, place, and time.  Skin: Skin is warm and dry.  Psychiatric: She has a normal mood and affect. Her behavior is normal. Judgment and thought content normal.   Vitals:   04/11/16 1001  BP: 130/80   Filed Weights   04/11/16 1001  Weight: 237 lb 12.8 oz (107.9 kg)    Encounter Medications:   Outpatient Encounter Prescriptions as of 04/11/2016  Medication Sig Note  . aspirin 325 MG EC tablet Take 325 mg by mouth daily.   Marland Kitchen atorvastatin (LIPITOR) 20 MG tablet Take 20 mg by mouth daily.   . cholecalciferol (VITAMIN D) 1000 units tablet Take 2,000 Units by mouth 2 (two) times daily.   . Ferrous Sulfate (IRON) 325 (65 Fe) MG TABS Take 1 tablet by mouth.   Marland Kitchen ibuprofen (ADVIL,MOTRIN) 200 MG tablet Take 200 mg by mouth every 8 (eight) hours as needed for headache. Reported on 02/22/2016   . metFORMIN (GLUCOPHAGE) 500 MG tablet Take 500 mg by mouth 2 (two) times daily. 02/22/2016: Prior to yesterday pt had stopped taking her medications for over 1 month   .  ondansetron (ZOFRAN) 8 MG tablet Take 1 tablet (8 mg total) by mouth every 8 (eight) hours as needed for nausea or vomiting.   Marland Kitchen SYNTHROID 125 MCG tablet Take 1 tablet (125 mcg total) by mouth daily.   Marland Kitchen telmisartan-hydrochlorothiazide (MICARDIS HCT) 80-25 MG tablet Take 1 tablet by mouth daily.    No facility-administered encounter medications on file as of 04/11/2016.     Functional Status:   In your present state of health, do you have any difficulty performing the following activities: 11/18/2015  Hearing? N  Vision? N  Difficulty concentrating or making decisions? N  Walking or climbing stairs? N  Dressing or bathing? N  Doing errands, shopping? N  Some recent data might be hidden    Fall/Depression Screening:    PHQ 2/9 Scores 04/11/2016 02/03/2015  PHQ - 2 Score 0 6  PHQ- 9 Score - 14    Assessment:   [employer] employee and Link To Wellness member meeting treatment targets for HTN but not hyperlipidemia (LDL= 122, triglycerides= 197 on 02/24/16 and DM (Hgb A1C= 10.1% on 02/24/16) primarily related to medication nonadherence,   Plan:  [facility] CM Care Plan Problem One   Flowsheet Row Most Recent Value  Care Plan Problem One Type II DM, HTN, hyperlipidemia and obesity (BMI= 39.57) - not meeting Hgb A1C target and lipid targets secondary to medication nonadherence  Role Documenting the Problem One  Care Management Coordinator  Care Plan for Problem One  Active  THN Long Term Goal (31-90 days) Improved glycemic control as evidenced by improvement in Hgb A1C of <8.0% and improved medication adherence as evidenced by [name] reporting that she has taken her diabetes medicine at least 75% of the time at/by next Link To Wellness visit , improved lipid panel and Vit D level at next assessment, ongoing good control of HTN as evidenced by BP readings <140/<90, no weight gain or evidence of weight loss at next assessment  THN Long Term Goal Start Date  04/11/16  Interventions for Problem One Long  Term Goal Discussed ED visit of 02/22/16 due to gastroenteritis vs food poisoning  and elevated glucose of 308 due to medication nonadherence, assessed barriers for taking medications as prescribed, reviewed mechanism of action of Metformin, reviewed lipid panel results of 6/8  Hgb A1C of 10.1% and Vit D level of 14.7, discussed meal planning strategies to improve triglycerides and LDL and Vit D level while reducing caloric intake, again reinforced the importance of medication adherence to improve glycemic control, lipids and Vit D deficiency, will arrange for Link To Wellness follow up after she sees Dr. Jacelyn Grip on 05/05/16       RNCM to fax today's office visit note to Dr. Jacelyn Grip. RNCM will meet quarterly and as needed with patient per Link To Wellness program guidelines to assist with Type II DM, HTN, hyperlipidemia and obesity self-management and assess patient's progress toward mutually set goals.

## 2016-06-09 MED FILL — SYNTHROID 125 MCG TABLET: 125 | 90 days supply | Qty: 90 | Fill #0

## 2016-07-11 ENCOUNTER — Other Ambulatory Visit: Payer: Self-pay | Admitting: *Deleted

## 2016-07-11 NOTE — Patient Outreach (Signed)
Secure e-mail sent to [facility] e-mail address requesting she schedule follow up Link To Wellness appointment.  Await response from [place]. Barrington Ellison RN,CCM,CDE [place] Management Coordinator Link To Wellness Office Phone [phone] Office Fax [phone]

## 2016-08-18 ENCOUNTER — Other Ambulatory Visit: Payer: Self-pay | Admitting: *Deleted

## 2016-08-18 ENCOUNTER — Encounter: Payer: Self-pay | Admitting: *Deleted

## 2016-08-18 NOTE — Patient Outreach (Signed)
[employer] has not responded to calls and emails sent to her [facility] E-mail address for requests to schedule a Link To Wellness follow up. She has not been seen since 04/11/16. A letter will be mailed to her residence requesting she call and schedule an appointment within 10 days or risk termination form the Link To wellness program and loss of pharmacy benefits.   Barrington Ellison RN,CCM,CDE [place] Management Coordinator Link To Wellness Office Phone [phone] Office Fax [phone]

## 2016-09-06 ENCOUNTER — Other Ambulatory Visit: Payer: Self-pay | Admitting: *Deleted

## 2016-09-06 ENCOUNTER — Encounter: Payer: Self-pay | Admitting: *Deleted

## 2016-09-06 VITALS — BP 120/80 | Ht 65.0 in | Wt 236.0 lb

## 2016-09-06 DIAGNOSIS — E1139 Type 2 diabetes mellitus with other diabetic ophthalmic complication: Secondary | ICD-10-CM

## 2016-09-06 LAB — POCT GLYCOSYLATED HEMOGLOBIN (HGB A1C): Hemoglobin A1C: 10

## 2016-09-06 NOTE — Patient Outreach (Signed)
[facility] [facility]) Care Management   09/06/2016  ELMIRE STANBRO 27-Feb-1951 [id number]  MAUDINE MCASKILL is an 65 y.o. female who presents to the [place] Management office for routine Link To Wellness follow up for self management assistance with Type II DM, HTN and hyperlipidemia.  Subjective: [name] says she is under stress related to her work but her home life with her daughter [name] is good.  [name] says she will have to have major dental surgery with most of her teeth extracted  and dental implants in the next few months. She said she inherited her Mom's weak teeth and her Mom required full dentures when she was 14 years old.  She says she has again stopped taking her medications, she says she just doesn't like to take medication and the Metformin causes her to have diarrhea and then she doesn't feel well enough to take her other medications. She said she has never consistently taken her statin because she was told to take it at night and she simply forgets. She says she has not enrolled in [place] - the [employer] disease management program.  She is requesting a POC Hgb A1C.   Objective:   Review of Systems  All other systems reviewed and are negative.   Physical Exam  Constitutional: She is oriented to person, place, and time. She appears well-developed and well-nourished.  Respiratory: Effort normal.  Neurological: She is alert and oriented to person, place, and time.  Skin: Skin is warm and dry.  Psychiatric: She has a normal mood and affect. Her behavior is normal. Judgment and thought content normal.   Vitals:   09/06/16 1142  BP: 120/80   Filed Weights   09/06/16 1142  Weight: 236 lb (107 kg)   POC Hgb A1C= 10.0% POC CBG one hour post meal= 222  Encounter Medications:   Outpatient Encounter Prescriptions as of 04/11/2016  Medication Sig Note  . aspirin 325 MG EC tablet Take 325 mg by mouth daily.   Marland Kitchen atorvastatin  (LIPITOR) 20 MG tablet Take 20 mg by mouth daily.   . cholecalciferol (VITAMIN D) 1000 units tablet Take 2,000 Units by mouth 2 (two) times daily.   . Ferrous Sulfate (IRON) 325 (65 Fe) MG TABS Take 1 tablet by mouth.   Marland Kitchen ibuprofen (ADVIL,MOTRIN) 200 MG tablet Take 200 mg by mouth every 8 (eight) hours as needed for headache. Reported on 02/22/2016   . metFORMIN (GLUCOPHAGE) 500 MG tablet Take 500 mg by mouth 2 (two) times daily. 02/22/2016: Prior to yesterday pt had stopped taking her medications for over 1 month   . ondansetron (ZOFRAN) 8 MG tablet Take 1 tablet (8 mg total) by mouth every 8 (eight) hours as needed for nausea or vomiting.   Marland Kitchen SYNTHROID 125 MCG tablet Take 1 tablet (125 mcg total) by mouth daily.   Marland Kitchen telmisartan-hydrochlorothiazide (MICARDIS HCT) 80-25 MG tablet Take 1 tablet by mouth daily.    No facility-administered encounter medications on file as of 04/11/2016.     Functional Status:   In your present state of health, do you have any difficulty performing the following activities: 09/06/2016 11/18/2015  Hearing? N N  Vision? N N  Difficulty concentrating or making decisions? N N  Walking or climbing stairs? N N  Dressing or bathing? N N  Doing errands, shopping? N N  Some recent data might be hidden    Fall/Depression Screening:    PHQ 2/9 Scores 04/11/2016 02/03/2015  PHQ - 2 Score 0 6  PHQ- 9 Score - 14    Assessment:   [employer] employee and Link To Wellness member meeting treatment targets for HTN but not hyperlipidemia (LDL= 122, triglycerides= 197 on 02/24/16 and DM with today's POC Hgb A1C= 10.0%  primarily related to medication nonadherence,   Plan:  [facility] CM Care Plan Problem One   Flowsheet Row Most Recent Value  Care Plan Problem One Type II DM, HTN, hyperlipidemia and obesity (BMI= 39.4) - not meeting Hgb A1C target and lipid targets secondary to medication nonadherence  Role Documenting the Problem One  Care Management [place] for Problem  One  Active  THN Long Term Goal (31-90 days) Improved glycemic control as evidenced by improvement in Hgb A1C of <9.0% and improved medication adherence as evidenced by >75% of medication adherence via [place],  ongoing good control of HTN as evidenced by BP readings <130/<80, no weight gain or evidence of weight loss at next assessment  THN Long Term Goal Start Date 09/06/16  Interventions for Problem One Long Term Goal Assessed barriers for taking medications as prescribed, per Evelyn's request will ask Dr. Jacelyn Grip if he will change her to [facility] as she tolerated it well in the past, assessed POC Hgb A1C and POC CBG and discussed results and correlation of Hgb A1C to estimated average glucose. Provided education on [place] and enrolled her in the program since she meets the criteria, assisted her with scheduling onboarding appointment and advised her ongoing disease management will be provided in 2018 by the [facility] program.      RNCM to fax today's office visit note to Dr. Jacelyn Grip.   Barrington Ellison RN,CCM,CDE [place] Management Coordinator Link To Wellness Office Phone [phone] Office Fax [phone]

## 2016-09-13 MED FILL — diazePAM 10 MG TABS: 10 | 2 days supply | Qty: 5 | Fill #0

## 2016-09-14 MED FILL — PENICILLIN VK 500 MG TABLET: 500 | 7 days supply | Qty: 30 | Fill #0

## 2016-09-14 MED FILL — NAPROXEN SODIUM 550 MG TAB: 550 | 4 days supply | Qty: 12 | Fill #0

## 2016-09-14 MED FILL — METHYLPREDNISOLONE 4 MG TAB: 4 | 6 days supply | Qty: 21 | Fill #0

## 2016-09-14 MED FILL — CHLORHEXIDINE 0.12% RINSE: 0.12 | 15 days supply | Qty: 473 | Fill #0

## 2016-09-21 MED FILL — NAPROXEN SODIUM 550 MG TAB: 550 | 4 days supply | Qty: 12 | Fill #1

## 2016-09-21 MED FILL — CHLORHEXIDINE 0.12% RINSE: 0.12 | 17 days supply | Qty: 473 | Fill #0

## 2016-10-17 DIAGNOSIS — N763 Subacute and chronic vulvitis: Secondary | ICD-10-CM | POA: Diagnosis not present

## 2016-10-17 DIAGNOSIS — Z124 Encounter for screening for malignant neoplasm of cervix: Secondary | ICD-10-CM | POA: Diagnosis not present

## 2016-10-17 DIAGNOSIS — Z6841 Body Mass Index (BMI) 40.0 and over, adult: Secondary | ICD-10-CM | POA: Diagnosis not present

## 2016-10-17 DIAGNOSIS — N7689 Other specified inflammation of vagina and vulva: Secondary | ICD-10-CM | POA: Diagnosis not present

## 2016-10-17 DIAGNOSIS — Z01419 Encounter for gynecological examination (general) (routine) without abnormal findings: Secondary | ICD-10-CM | POA: Diagnosis not present

## 2016-10-17 DIAGNOSIS — R300 Dysuria: Secondary | ICD-10-CM | POA: Diagnosis not present

## 2016-10-17 DIAGNOSIS — N771 Vaginitis, vulvitis and vulvovaginitis in diseases classified elsewhere: Secondary | ICD-10-CM | POA: Diagnosis not present

## 2016-10-17 MED FILL — CLOBETASOL 0.05% CREAM: 0.05 | 14 days supply | Qty: 30 | Fill #0

## 2016-10-19 MED FILL — FLUCONAZOLE 150 MG TABLET: 150 | 1 days supply | Qty: 1 | Fill #0

## 2016-10-19 MED FILL — AMPICILLIN TR 500 MG CAP: 500 | 5 days supply | Qty: 10 | Fill #0

## 2016-12-25 DIAGNOSIS — E039 Hypothyroidism, unspecified: Secondary | ICD-10-CM | POA: Diagnosis not present

## 2016-12-25 DIAGNOSIS — H6501 Acute serous otitis media, right ear: Secondary | ICD-10-CM | POA: Diagnosis not present

## 2016-12-25 MED FILL — AMOXICILLIN 875 MG TABLET: 875 | 10 days supply | Qty: 20 | Fill #0

## 2017-01-24 DIAGNOSIS — J3089 Other allergic rhinitis: Secondary | ICD-10-CM | POA: Diagnosis not present

## 2017-01-24 DIAGNOSIS — J040 Acute laryngitis: Secondary | ICD-10-CM | POA: Diagnosis not present

## 2017-01-24 DIAGNOSIS — J069 Acute upper respiratory infection, unspecified: Secondary | ICD-10-CM | POA: Diagnosis not present

## 2017-01-24 DIAGNOSIS — E119 Type 2 diabetes mellitus without complications: Secondary | ICD-10-CM | POA: Diagnosis not present

## 2017-01-24 DIAGNOSIS — Z7984 Long term (current) use of oral hypoglycemic drugs: Secondary | ICD-10-CM | POA: Diagnosis not present

## 2017-03-05 DIAGNOSIS — E1142 Type 2 diabetes mellitus with diabetic polyneuropathy: Secondary | ICD-10-CM | POA: Diagnosis not present

## 2017-03-05 DIAGNOSIS — E559 Vitamin D deficiency, unspecified: Secondary | ICD-10-CM | POA: Diagnosis not present

## 2017-03-05 DIAGNOSIS — Z7984 Long term (current) use of oral hypoglycemic drugs: Secondary | ICD-10-CM | POA: Diagnosis not present

## 2017-03-05 DIAGNOSIS — E785 Hyperlipidemia, unspecified: Secondary | ICD-10-CM | POA: Diagnosis not present

## 2017-03-05 DIAGNOSIS — E039 Hypothyroidism, unspecified: Secondary | ICD-10-CM | POA: Diagnosis not present

## 2017-03-05 DIAGNOSIS — E119 Type 2 diabetes mellitus without complications: Secondary | ICD-10-CM | POA: Diagnosis not present

## 2017-03-05 DIAGNOSIS — R899 Unspecified abnormal finding in specimens from other organs, systems and tissues: Secondary | ICD-10-CM | POA: Diagnosis not present

## 2017-03-05 DIAGNOSIS — I10 Essential (primary) hypertension: Secondary | ICD-10-CM | POA: Diagnosis not present

## 2017-03-05 MED FILL — GLIMEPIRIDE 4 MG TABLET: 4 | 90 days supply | Qty: 90 | Fill #0

## 2017-03-05 MED FILL — SYNTHROID 125 MCG TABLET: 125 | 90 days supply | Qty: 90 | Fill #0

## 2017-03-27 DIAGNOSIS — E1121 Type 2 diabetes mellitus with diabetic nephropathy: Secondary | ICD-10-CM | POA: Diagnosis not present

## 2017-03-27 DIAGNOSIS — Z7984 Long term (current) use of oral hypoglycemic drugs: Secondary | ICD-10-CM | POA: Diagnosis not present

## 2017-03-28 MED FILL — traMADol HCL 50 MG TABS: 50 | 5 days supply | Qty: 20 | Fill #0

## 2017-03-28 MED FILL — GABAPENTIN 300 MG CAPSULE: 300 | 30 days supply | Qty: 90 | Fill #0

## 2017-06-03 ENCOUNTER — Emergency Department (HOSPITAL_COMMUNITY)
Admission: EM | Admit: 2017-06-03 | Discharge: 2017-06-03 | Disposition: A | Discharge status: Home / Self Care | Payer: 59 | Attending: Emergency Medicine | Admitting: Emergency Medicine

## 2017-06-03 ENCOUNTER — Encounter (HOSPITAL_COMMUNITY): Payer: Self-pay

## 2017-06-03 DIAGNOSIS — K2900 Acute gastritis without bleeding: Secondary | ICD-10-CM | POA: Diagnosis not present

## 2017-06-03 DIAGNOSIS — N390 Urinary tract infection, site not specified: Secondary | ICD-10-CM | POA: Insufficient documentation

## 2017-06-03 DIAGNOSIS — R1030 Lower abdominal pain, unspecified: Secondary | ICD-10-CM | POA: Diagnosis not present

## 2017-06-03 DIAGNOSIS — R1084 Generalized abdominal pain: Secondary | ICD-10-CM | POA: Diagnosis present

## 2017-06-03 LAB — CBC WITH DIFFERENTIAL/PLATELET
BASOS PCT: 0 %
Basophils Absolute: 0 10*3/uL (ref 0.0–0.1)
Eosinophils Absolute: 0.2 10*3/uL (ref 0.0–0.7)
Eosinophils Relative: 1 %
HEMATOCRIT: 40.4 % (ref 36.0–46.0)
Hemoglobin: 13.3 g/dL (ref 12.0–15.0)
Lymphocytes Relative: 25 %
Lymphs Abs: 3.8 10*3/uL (ref 0.7–4.0)
MCH: 28.4 pg (ref 26.0–34.0)
MCHC: 32.9 g/dL (ref 30.0–36.0)
MCV: 86.3 fL (ref 78.0–100.0)
Monocytes Absolute: 0.6 10*3/uL (ref 0.1–1.0)
Monocytes Relative: 4 %
NEUTROS ABS: 10.9 10*3/uL — AB (ref 1.7–7.7)
Neutrophils Relative %: 70 %
Platelets: 292 10*3/uL (ref 150–400)
RBC: 4.68 MIL/uL (ref 3.87–5.11)
RDW: 14.6 % (ref 11.5–15.5)
WBC: 15.4 10*3/uL — ABNORMAL HIGH (ref 4.0–10.5)

## 2017-06-03 LAB — URINALYSIS, ROUTINE W REFLEX MICROSCOPIC
BILIRUBIN URINE: NEGATIVE
Glucose, UA: >=500 mg/dL — AB
HGB URINE DIPSTICK: NEGATIVE
KETONES UR: 5 mg/dL — AB
Nitrite: NEGATIVE
PROTEIN: 100 mg/dL — AB
Specific Gravity, Urine: 1.029 (ref 1.005–1.030)
pH: 5 (ref 5.0–8.0)

## 2017-06-03 LAB — LIPASE, BLOOD: Lipase: 28 U/L (ref 11–51)

## 2017-06-03 LAB — COMPREHENSIVE METABOLIC PANEL
ALBUMIN: 3.9 g/dL (ref 3.5–5.0)
ALK PHOS: 65 U/L (ref 38–126)
ALT: 57 U/L — ABNORMAL HIGH (ref 14–54)
ANION GAP: 9 (ref 5–15)
AST: 60 U/L — AB (ref 15–41)
BUN: 18 mg/dL (ref 6–20)
CO2: 27 mmol/L (ref 22–32)
Calcium: 9.3 mg/dL (ref 8.9–10.3)
Chloride: 101 mmol/L (ref 101–111)
Creatinine, Ser: 0.57 mg/dL (ref 0.44–1.00)
GFR calc Af Amer: >60 mL/min (ref >60)
GFR calc non Af Amer: >60 mL/min (ref >60)
GLUCOSE: 237 mg/dL — AB (ref 65–99)
POTASSIUM: 3.6 mmol/L (ref 3.5–5.1)
SODIUM: 137 mmol/L (ref 135–145)
Total Bilirubin: 0.7 mg/dL (ref 0.3–1.2)
Total Protein: 7.6 g/dL (ref 6.5–8.1)

## 2017-06-03 LAB — I-STAT TROPONIN, ED: Troponin i, poc: 0 ng/mL (ref 0.00–0.08)

## 2017-06-03 MED ORDER — GI COCKTAIL ~~LOC~~
30.0000 mL | Freq: Once | ORAL | Status: AC
  Administered 2017-06-03: 30 mL via ORAL
  Filled 2017-06-03: qty 30

## 2017-06-03 MED ORDER — DICYCLOMINE HCL 20 MG PO TABS: 20.0000 mg | ORAL_TABLET | Freq: Three times a day (TID) | ORAL | 0 refills | Status: DC

## 2017-06-03 MED ORDER — ONDANSETRON 4 MG PO TBDP
4.0000 mg | ORAL_TABLET | Freq: Once | ORAL | Status: AC
  Administered 2017-06-03: 4 mg via ORAL
  Filled 2017-06-03: qty 1

## 2017-06-03 MED ORDER — CEPHALEXIN 500 MG PO CAPS: 500.0000 mg | ORAL_CAPSULE | Freq: Three times a day (TID) | ORAL | 0 refills | Status: AC

## 2017-06-03 MED ORDER — OMEPRAZOLE 20 MG PO CPDR: 20.0000 mg | DELAYED_RELEASE_CAPSULE | Freq: Every day | ORAL | 0 refills | Status: DC

## 2017-06-03 NOTE — ED Provider Notes (Signed)
[place] [facility] DEPT Provider Note   CSN: [id number] Arrival date & time: 06/03/17  1721     History   Chief Complaint No chief complaint on file.   HPI [name] is a 66 y.o. female.  HPI   66 year old female with past medical history as below who presents with acute onset of nausea, vomiting, and diaphoresis. The patient states that she ate a sausage biscuit at lunch today. Approximately 1 L, she developed acute onset of severe, cramp-like epigastric pain with associated vomiting. Once she began vomiting, she felt sweaty and lightheaded. She had associated diffuse abdominal cramping. Her coworker saw her and she presents to the ER as per work guidelines. She currently states she feels much improved after vomiting. She continues to have intermittent diffuse abdominal cramping. Denies any diarrhea. No recent constipation. Of note, she does have frequent urination, but states this is common for her. Denies any overt chest pain. Denies known history of coronary disease.  Past Medical History:  Diagnosis Date  . Colon polyps   . Deficiency anemia 07/06/2014  . Diabetes mellitus   . Elevated LFTs [place]   lobular hepatitis and fibrosis on biopsy  . GERD (gastroesophageal reflux disease)   . Heart murmur   . Hypercholesteremia   . Hypertension   . Hypothyroidism   . Inflammation of labium 11/01/15   with benign biopsy of right labium  . Leukocytosis 07/06/2014  . Melanoma ([place]) 8/06 L cheek(clarks level 2) DrTafeen  . Sleep apnea, obstructive severe,02/2010   not using CPAP  . TIA (transient ischemic attack) 05/2007  . TIA (transient ischemic attack) 2009  . Vitamin D deficiency     Patient Active Problem List   Diagnosis Date Noted  . Leukocytosis 07/06/2014  . Deficiency anemia 07/06/2014  . History of melanoma 07/06/2014  . Preventive measure 07/06/2014  . Type II or unspecified type diabetes mellitus without mention of complication, uncontrolled 05/12/2013  .  Noncompliance with medication regimen 08/26/2012  . Tobacco use disorder 11/29/2011  . Type 2 diabetes mellitus without complication ([place]) 34/19/3790  . Depressive disorder, not elsewhere classified 05/01/2011  . Essential hypertension, benign 05/01/2011  . Pure hypercholesterolemia 05/01/2011  . Unspecified hypothyroidism 05/01/2011    Past Surgical History:  Procedure Laterality Date  . BREAST SURGERY  benign breast tumor removed 1974  . cardiolite stress  normal  11/04 (Dr. Jaci Standard)  . CATARACT EXTRACTION W/PHACO  11/28/2011   Procedure: CATARACT EXTRACTION PHACO AND INTRAOCULAR LENS PLACEMENT (IOC);  Surgeon: Elta Guadeloupe T. Gershon Crane, MD;  Location: [facility];  Service: Ophthalmology;  Laterality: Left;  CDE 6.72  . CESAREAN SECTION  x 1; 1987  . COLONOSCOPY  DrHayes  . DILATION AND CURETTAGE OF UTERUS  11/2007  . HEMORRHOID SURGERY  external  . MELANOMA EXCISION  L cheek 8/06  . OTHER SURGICAL HISTORY Left 11/01/15   left labium    OB History    No data available       Home Medications    Prior to Admission medications   Medication Sig Start Date End Date Taking? Authorizing Provider  aspirin 325 MG EC tablet Take 325 mg by mouth daily.    [provider]  atorvastatin (LIPITOR) 20 MG tablet Take 20 mg by mouth daily.    [provider]  cephALEXin (KEFLEX) 500 MG capsule Take 1 capsule (500 mg total) by mouth 3 (three) times daily. 06/03/17 06/08/17  Duffy Bruce, MD  cholecalciferol (VITAMIN D) 1000 units tablet Take 2,000 Units  by mouth 2 (two) times daily.    [provider]  dicyclomine (BENTYL) 20 MG tablet Take 1 tablet (20 mg total) by mouth 4 (four) times daily -  before meals and at bedtime. 06/03/17 06/08/17  Duffy Bruce, MD  ergocalciferol (VITAMIN D2) 50000 units capsule Take 50,000 Units by mouth once a week.    [provider]  Ferrous Sulfate (IRON) 325 (65 Fe) MG TABS Take 1 tablet by mouth.    [provider]  ibuprofen  (ADVIL,MOTRIN) 200 MG tablet Take 200 mg by mouth every 8 (eight) hours as needed for headache. Reported on 02/22/2016    [provider]  metFORMIN (GLUCOPHAGE) 500 MG tablet Take 500 mg by mouth 2 (two) times daily. 12/29/15   [provider]  omeprazole (PRILOSEC) 20 MG capsule Take 1 capsule (20 mg total) by mouth daily. Take 30 minutes before eating, first thing in the morning 06/03/17 06/17/17  Duffy Bruce, MD  ondansetron (ZOFRAN) 8 MG tablet Take 1 tablet (8 mg total) by mouth every 8 (eight) hours as needed for nausea or vomiting. Patient not taking: Reported on 09/06/2016 02/23/16   Molpus, Jenny Reichmann, MD  SYNTHROID 125 MCG tablet Take 1 tablet (125 mcg total) by mouth daily. Patient not taking: Reported on 09/06/2016 03/31/13   Rita Ohara, MD  telmisartan-hydrochlorothiazide (MICARDIS HCT) 80-25 MG tablet Take 1 tablet by mouth daily.    [provider]    Family History Family History  Problem Relation Age of Onset  . Stroke Mother   . Arthritis Mother        rheumatoid arthritis  . Hyperlipidemia Brother   . Hypertension Brother   . Diabetes Brother     Social History Social History  Substance Use Topics  . Smoking status: Former Smoker    Packs/day: 0.10    Types: Cigarettes    Quit date: 11/18/2012  . Smokeless tobacco: Never Used     Comment: quit smoking 2006; recently restarted due to stressors; quit again 11/2012  . Alcohol use Yes     Comment: 1 drink once a year     Allergies   Ace inhibitors; Acetaminophen; and Metformin and related   Review of Systems Review of Systems  Constitutional: Positive for fatigue.  Gastrointestinal: Positive for abdominal pain, nausea and vomiting.  All other systems reviewed and are negative.    Physical Exam Updated Vital Signs BP 130/68 (BP Location: Left Arm)   Pulse 77   Resp 16   Ht 5\' 5"  (1.651 m)   Wt 103.9 kg (229 lb)   SpO2 95%   BMI 38.11 kg/m   Physical Exam  Constitutional: She is  oriented to person, place, and time. She appears well-developed and well-nourished. No distress.  HENT:  Head: Normocephalic and atraumatic.  Eyes: Conjunctivae are normal.  Neck: Neck supple.  Cardiovascular: Normal rate, regular rhythm and normal heart sounds.  Exam reveals no friction rub.   No murmur heard. Pulmonary/Chest: Effort normal and breath sounds normal. No respiratory distress. She has no wheezes. She has no rales.  Abdominal: Soft. She exhibits no distension. There is tenderness (Moderate, epigastric). There is no rebound and no guarding.  No CVA tenderness bilaterally  Musculoskeletal: She exhibits no edema.  Neurological: She is alert and oriented to person, place, and time. She exhibits normal muscle tone.  Skin: Skin is warm. Capillary refill takes less than 2 seconds.  Psychiatric: She has a normal mood and affect.  Nursing note and  vitals reviewed.    ED Treatments / Results  Labs (all labs ordered are listed, but only abnormal results are displayed) Labs Reviewed  CBC WITH DIFFERENTIAL/PLATELET - Abnormal; Notable for the following:       Result Value   WBC 15.4 (*)    Neutro Abs 10.9 (*)    All other components within normal limits  COMPREHENSIVE METABOLIC PANEL - Abnormal; Notable for the following:    Glucose, Bld 237 (*)    AST 60 (*)    ALT 57 (*)    All other components within normal limits  URINALYSIS, ROUTINE W REFLEX MICROSCOPIC - Abnormal; Notable for the following:    APPearance HAZY (*)    Glucose, UA >=500 (*)    Ketones, ur 5 (*)    Protein, ur 100 (*)    Leukocytes, UA MODERATE (*)    Bacteria, UA RARE (*)    Squamous Epithelial / LPF 6-30 (*)    All other components within normal limits  URINE CULTURE  LIPASE, BLOOD  I-STAT TROPONIN, ED    EKG  EKG Interpretation  Date/Time:  Sunday June 03 2017 18:46:42 EDT Ventricular Rate:  74 PR Interval:    QRS Duration: 95 QT Interval:  410 QTC Calculation: 455 R Axis:   54 Text  Interpretation:  Sinus rhythm Low voltage, precordial leads Nonspecific T abnormalities, lateral leads since last tracing no significant change Confirmed by Wentz, Elliott ([id number]) on 06/03/2017 6:58:45 PM       Radiology No results found.  Procedures Procedures (including critical care time)  Medications Ordered in ED Medications  ondansetron (ZOFRAN-ODT) disintegrating tablet 4 mg (4 mg Oral Given 06/03/17 1913)  gi cocktail (Maalox,Lidocaine,Donnatal) (30 mLs Oral Given 06/03/17 1913)     Initial Impression / Assessment and Plan / ED Course  I have reviewed the triage vital signs and the nursing notes.  Pertinent labs & imaging results that were available during my care of the patient were reviewed by me and considered in my medical decision making (see chart for details).     65  year old female with past medical history as above here with diffuse abdominal cramping and brief episode of nausea and vomiting. Symptoms are resolved. This occurred after eating, as well as taking a high dose of ibuprofen due to chronic leg pain. I suspect this is all secondary to food poisoning versus gastritis. Her EKG is nonischemic and she had no associated shortness of breath or symptoms to suggest cardiac etiology. Troponin is negative. Her abdomen is otherwise soft and lab work is very reassuring. Shows mild leukocytosis but this has previously been noted. She also has mild transaminitis, but this is an ongoing issue and LFTs are at baseline. She has normal alkaline phosphatase, normal bilirubin, direct quadrant tenderness, and I do not suspect acute cholecystitis or cholangitis. Will place on Bentyl, antacid, and discharge with outpatient follow-up. Of note, the patient also has UTI. She has a history of the same. No CVA tenderness to suggest pyelonephritis. I do not feel this etiology for her vomiting. Will give her a course of Keflex.  This note was prepared with assistance of TEFL teacher. Occasional wrong-word or sound-a-like substitutions may have occurred due to the inherent limitations of voice recognition software.   Final Clinical Impressions(s) / ED Diagnoses   Final diagnoses:  Generalized abdominal pain  Acute gastritis without hemorrhage, unspecified gastritis type  Lower urinary tract infection    New Prescriptions New Prescriptions   CEPHALEXIN (  KEFLEX) 500 MG CAPSULE    Take 1 capsule (500 mg total) by mouth 3 (three) times daily.   DICYCLOMINE (BENTYL) 20 MG TABLET    Take 1 tablet (20 mg total) by mouth 4 (four) times daily -  before meals and at bedtime.   OMEPRAZOLE (PRILOSEC) 20 MG CAPSULE    Take 1 capsule (20 mg total) by mouth daily. Take 30 minutes before eating, first thing in the morning     Duffy Bruce, MD 06/03/17 2251

## 2017-06-03 NOTE — ED Notes (Signed)
Discharge instructions reviewed with patient. Patient verbalizes understanding. VSS.   

## 2017-06-03 NOTE — ED Triage Notes (Signed)
Patient presented to ed with c/o abdominal pain. Patient was at work at [employer] working at [employer] and she stated to vomited for about a minutes. Pt states she is feeling fine now upon arrived.

## 2017-06-03 NOTE — ED Notes (Signed)
Bed: JM42 Expected date:  Expected time:  Means of arrival:  Comments: 66 yo LLQ pain

## 2017-06-06 LAB — URINE CULTURE

## 2017-06-12 MED FILL — OMEPRAZOLE 20 MG CAP: 20 | 14 days supply | Qty: 14 | Fill #0

## 2017-06-12 MED FILL — CEPHALEXIN 500 MG CAPSULE: 500 | 5 days supply | Qty: 15 | Fill #0

## 2017-08-22 ENCOUNTER — Other Ambulatory Visit: Payer: Self-pay | Admitting: *Deleted

## 2017-08-22 NOTE — Patient Outreach (Signed)
[name] transitioned from the [employer] To Wellness program to the [employer] on 10/03/16/18 for Type II diabetes self-management assistance so will close case to the diabetes Link To Wellness program due to delegation of disease management services to [employer] from [employer] for [place] members in 2019. Barrington Ellison RN,CCM,CDE [place] Management Coordinator Link To Wellness and [employer] [phone] Office Fax [phone]

## 2017-09-03 DIAGNOSIS — H52203 Unspecified astigmatism, bilateral: Secondary | ICD-10-CM | POA: Diagnosis not present

## 2017-09-05 MED FILL — SYNTHROID 125 MCG TABLET: 125 | 90 days supply | Qty: 90 | Fill #1

## 2017-09-13 DIAGNOSIS — H26492 Other secondary cataract, left eye: Secondary | ICD-10-CM | POA: Diagnosis not present

## 2017-09-14 MED FILL — SYNTHROID 125 MCG TABLET: 125 | 90 days supply | Qty: 90 | Fill #2

## 2017-12-28 ENCOUNTER — Ambulatory Visit (INDEPENDENT_AMBULATORY_CARE_PROVIDER_SITE_OTHER): Payer: Self-pay | Admitting: Nurse Practitioner

## 2017-12-28 ENCOUNTER — Encounter: Payer: Self-pay | Admitting: Nurse Practitioner

## 2017-12-28 ENCOUNTER — Telehealth: Payer: 59 | Admitting: Family

## 2017-12-28 VITALS — BP 140/82 | HR 74 | Temp 98.6°F | Wt 222.6 lb

## 2017-12-28 DIAGNOSIS — M5442 Lumbago with sciatica, left side: Secondary | ICD-10-CM

## 2017-12-28 DIAGNOSIS — M5440 Lumbago with sciatica, unspecified side: Secondary | ICD-10-CM | POA: Diagnosis not present

## 2017-12-28 DIAGNOSIS — N390 Urinary tract infection, site not specified: Secondary | ICD-10-CM

## 2017-12-28 LAB — POCT URINALYSIS DIPSTICK
Bilirubin, UA: NEGATIVE
Blood, UA: NEGATIVE
Glucose, UA: 2
KETONES UA: NEGATIVE
NITRITE UA: NEGATIVE
PH UA: 6 (ref 5.0–8.0)
PROTEIN UA: NEGATIVE
Spec Grav, UA: 1.01 (ref 1.010–1.025)
UROBILINOGEN UA: 0.2 U/dL

## 2017-12-28 MED ORDER — BACLOFEN 10 MG PO TABS: 10.0000 mg | ORAL_TABLET | Freq: Three times a day (TID) | ORAL | 0 refills | Status: DC | PRN

## 2017-12-28 MED ORDER — SULFAMETHOXAZOLE-TRIMETHOPRIM 800-160 MG PO TABS: 1.0000 | ORAL_TABLET | Freq: Two times a day (BID) | ORAL | 0 refills | Status: AC

## 2017-12-28 MED ORDER — ETODOLAC 300 MG PO CAPS: 300.0000 mg | ORAL_CAPSULE | Freq: Two times a day (BID) | ORAL | 0 refills | Status: DC

## 2017-12-28 MED FILL — SULFAMETHOXAZOLE-TMP DS TAB: 800-160 | 3 days supply | Qty: 6 | Fill #0

## 2017-12-28 MED FILL — BACLOFEN 10 MG TABLET: 10 | 10 days supply | Qty: 30 | Fill #0

## 2017-12-28 MED FILL — ETODOLAC 300 MG CAPSULE: 300 | 10 days supply | Qty: 20 | Fill #0

## 2017-12-28 NOTE — Progress Notes (Signed)
Thank you for the details you included in the comment boxes. Those details are very helpful in determining the best course of treatment for you and help [place] to provide the best care.*Do not take the ibuprofen during the Etodolac (this prescription is better and stronger)  We are sorry that you are not feeling well.  Here is how we plan to help!  Based on what you have shared with me it looks like you mostly have acute back pain.  Acute back pain is defined as musculoskeletal pain that can resolve in 1-3 weeks with conservative treatment.  I have prescribed Etodolac 300 mg twice a day non-steroid anti-inflammatory (NSAID) as well as Baclofen 10 mg every eight hours as needed which is a muscle relaxer  Some patients experience stomach irritation or in increased heartburn with anti-inflammatory drugs.  Please keep in mind that muscle relaxer's can cause fatigue and should not be taken while at work or driving.  Back pain is very common.  The pain often gets better over time.  The cause of back pain is usually not dangerous.  Most people can learn to manage their back pain on their own.  Home Care  Stay active.  Start with short walks on flat ground if you can.  Try to walk farther each day.  Do not sit, drive or stand in one place for more than 30 minutes.  Do not stay in bed.  Do not avoid exercise or work.  Activity can help your back heal faster.  Be careful when you bend or lift an object.  Bend at your knees, keep the object close to you, and do not twist.  Sleep on a firm mattress.  Lie on your side, and bend your knees.  If you lie on your back, put a pillow under your knees.  Only take medicines as told by your doctor.  Put ice on the injured area.  Put ice in a plastic bag  Place a towel between your skin and the bag  Leave the ice on for 15-20 minutes, 3-4 times a day for the first 2-3 days. 210 After that, you can switch between ice and heat packs.  Ask your doctor about back  exercises or massage.  Avoid feeling anxious or stressed.  Find good ways to deal with stress, such as exercise.  Get Help Right Way If:  Your pain does not go away with rest or medicine.  Your pain does not go away in 1 week.  You have new problems.  You do not feel well.  The pain spreads into your legs.  You cannot control when you poop (bowel movement) or pee (urinate)  You feel sick to your stomach (nauseous) or throw up (vomit)  You have belly (abdominal) pain.  You feel like you may pass out (faint).  If you develop a fever.  Make Sure you:  Understand these instructions.  Will watch your condition  Will get help right away if you are not doing well or get worse.  Your e-visit answers were reviewed by a board certified advanced clinical practitioner to complete your personal care plan.  Depending on the condition, your plan could have included both over the counter or prescription medications.  If there is a problem please reply  once you have received a response from your provider.  Your safety is important to [place].  If you have drug allergies check your prescription carefully.    You can use MyChart to ask questions  about today's visit, request a non-urgent call back, or ask for a work or school excuse for 24 hours related to this e-Visit. If it has been greater than 24 hours you will need to follow up with your provider, or enter a new e-Visit to address those concerns.  You will get an e-mail in the next two days asking about your experience.  I hope that your e-visit has been valuable and will speed your recovery. Thank you for using e-visits.

## 2017-12-28 NOTE — Patient Instructions (Signed)
Sciatica Sciatica is pain, numbness, weakness, or tingling along the path of the sciatic nerve. The sciatic nerve starts in the lower back and runs down the back of each leg. The nerve controls the muscles in the lower leg and in the back of the knee. It also provides feeling (sensation) to the back of the thigh, the lower leg, and the sole of the foot. Sciatica is a symptom of another medical condition that pinches or puts pressure on the sciatic nerve. Generally, sciatica only affects one side of the body. Sciatica usually goes away on its own or with treatment. In some cases, sciatica may keep coming back (recur). What are the causes? This condition is caused by pressure on the sciatic nerve, or pinching of the sciatic nerve. This may be the result of:  A disk in between the bones of the spine (vertebrae) bulging out too far (herniated disk).  Age-related changes in the spinal disks (degenerative disk disease).  A pain disorder that affects a muscle in the buttock (piriformis syndrome).  Extra bone growth (bone spur) near the sciatic nerve.  An injury or break (fracture) of the pelvis.  Pregnancy.  Tumor (rare). What increases the risk? The following factors may make you more likely to develop this condition:  Playing sports that place pressure or stress on the spine, such as football or weight lifting.  Having poor strength and flexibility.  A history of back injury.  A history of back surgery.  Sitting for long periods of time.  Doing activities that involve repetitive bending or lifting.  Obesity. What are the signs or symptoms? Symptoms can vary from mild to very severe, and they may include:  Any of these problems in the lower back, leg, hip, or buttock:  Mild tingling or dull aches.  Burning sensations.  Sharp pains.  Numbness in the back of the calf or the sole of the foot.  Leg weakness.  Severe back pain that makes movement difficult. These symptoms may  get worse when you cough, sneeze, or laugh, or when you sit or stand for long periods of time. Being overweight may also make symptoms worse. In some cases, symptoms may recur over time. How is this diagnosed? This condition may be diagnosed based on:  Your symptoms.  A physical exam. Your health care provider may ask you to do certain movements to check whether those movements trigger your symptoms.  You may have tests, including:  Blood tests.  X-rays.  MRI.  CT scan. How is this treated? In many cases, this condition improves on its own, without any treatment. However, treatment may include:  Reducing or modifying physical activity during periods of pain.  Exercising and stretching to strengthen your abdomen and improve the flexibility of your spine.  Icing and applying heat to the affected area.  Medicines that help:  To relieve pain and swelling.  To relax your muscles.  Injections of medicines that help to relieve pain, irritation, and inflammation around the sciatic nerve (steroids).  Surgery. Follow these instructions at home: Medicines   Take over-the-counter and prescription medicines only as told by your health care provider.  Do not drive or operate heavy machinery while taking prescription pain medicine. Managing pain   If directed, apply ice to the affected area.  Put ice in a plastic bag.  Place a towel between your skin and the bag.  Leave the ice on for 20 minutes, 2-3 times a day.  After icing, apply heat to the   heat to the affected area before you exercise or as often as told by your health care provider. Use the heat source that your health care provider recommends, such as a moist heat pack or a heating pad. ? Place a towel between your skin and the heat source. ? Leave the heat on for 20-30 minutes. ? Remove the heat if your skin turns bright red. This is especially important if you are unable to feel pain, heat, or cold. You may have a  greater risk of getting burned. Activity  Return to your normal activities as told by your health care provider. Ask your health care provider what activities are safe for you. ? Avoid activities that make your symptoms worse.  Take brief periods of rest throughout the day. Resting in a lying or standing position is usually better than sitting to rest. ? When you rest for longer periods, mix in some mild activity or stretching between periods of rest. This will help to prevent stiffness and pain. ? Avoid sitting for long periods of time without moving. Get up and move around at least one time each hour.  Exercise and stretch regularly, as told by your health care provider.  Do not lift anything that is heavier than 10 lb (4.5 kg) while you have symptoms of sciatica. When you do not have symptoms, you should still avoid heavy lifting, especially repetitive heavy lifting.  When you lift objects, always use proper lifting technique, which includes: ? Bending your knees. ? Keeping the load close to your body. ? Avoiding twisting. General instructions  Use good posture. ? Avoid leaning forward while sitting. ? Avoid hunching over while standing.  Maintain a healthy weight. Excess weight puts extra stress on your back and makes it difficult to maintain good posture.  Wear supportive, comfortable shoes. Avoid wearing high heels.  Avoid sleeping on a mattress that is too soft or too hard. A mattress that is firm enough to support your back when you sleep may help to reduce your pain.  Keep all follow-up visits as told by your health care provider. This is important. Contact a health care provider if:  You have pain that wakes you up when you are sleeping.  You have pain that gets worse when you lie down.  Your pain is worse than you have experienced in the past.  Your pain lasts longer than 4 weeks.  You experience unexplained weight loss. Get help right away if:  You lose control  of your bowel or bladder (incontinence).  You have: ? Weakness in your lower back, pelvis, buttocks, or legs that gets worse. ? Redness or swelling of your back. ? A burning sensation when you urinate. This information is not intended to replace advice given to you by your health care provider. Make sure you discuss any questions you have with your health care provider. Document Released: 08/29/2001 Document Revised: 02/08/2016 Document Reviewed: 05/14/2015 Elsevier Interactive Patient Education  2018 [employer].  Urinary Tract Infection, Adult A urinary tract infection (UTI) is an infection of any part of the urinary tract, which includes the kidneys, ureters, bladder, and urethra. These organs make, store, and get rid of urine in the body. UTI can be a bladder infection (cystitis) or kidney infection (pyelonephritis). What are the causes? This infection may be caused by fungi, viruses, or bacteria. Bacteria are the most common cause of UTIs. This condition can also be caused by repeated incomplete emptying of the bladder during urination. What increases  the risk? This condition is more likely to develop if:  You ignore your need to urinate or hold urine for long periods of time.  You do not empty your bladder completely during urination.  You wipe back to front after urinating or having a bowel movement, if you are female.  You are uncircumcised, if you are female.  You are constipated.  You have a urinary catheter that stays in place (indwelling).  You have a weak defense (immune) system.  You have a medical condition that affects your bowels, kidneys, or bladder.  You have diabetes.  You take antibiotic medicines frequently or for long periods of time, and the antibiotics no longer work well against certain types of infections (antibiotic resistance).  You take medicines that irritate your urinary tract.  You are exposed to chemicals that irritate your urinary  tract.  You are female.  What are the signs or symptoms? Symptoms of this condition include:  Fever.  Frequent urination or passing small amounts of urine frequently.  Needing to urinate urgently.  Pain or burning with urination.  Urine that smells bad or unusual.  Cloudy urine.  Pain in the lower abdomen or back.  Trouble urinating.  Blood in the urine.  Vomiting or being less hungry than normal.  Diarrhea or abdominal pain.  Vaginal discharge, if you are female.  How is this diagnosed? This condition is diagnosed with a medical history and physical exam. You will also need to provide a urine sample to test your urine. Other tests may be done, including:  Blood tests.  Sexually transmitted disease (STD) testing.  If you have had more than one UTI, a cystoscopy or imaging studies may be done to determine the cause of the infections. How is this treated? Treatment for this condition often includes a combination of two or more of the following:  Antibiotic medicine.  Other medicines to treat less common causes of UTI.  Over-the-counter medicines to treat pain.  Drinking enough water to stay hydrated.  Follow these instructions at home:  Take over-the-counter and prescription medicines only as told by your health care provider.  If you were prescribed an antibiotic, take it as told by your health care provider. Do not stop taking the antibiotic even if you start to feel better.  Avoid alcohol, caffeine, tea, and carbonated beverages. They can irritate your bladder.  Drink enough fluid to keep your urine clear or pale yellow.  Keep all follow-up visits as told by your health care provider. This is important.  Make sure to: ? Empty your bladder often and completely. Do not hold urine for long periods of time. ? Empty your bladder before and after sex. ? Wipe from front to back after a bowel movement if you are female. Use each tissue one time when you  wipe. Contact a health care provider if:  You have back pain.  You have a fever.  You feel nauseous or vomit.  Your symptoms do not get better after 3 days.  Your symptoms go away and then return. Get help right away if:  You have severe back pain or lower abdominal pain.  You are vomiting and cannot keep down any medicines or water. This information is not intended to replace advice given to you by your health care provider. Make sure you discuss any questions you have with your health care provider. Document Released: 06/14/2005 Document Revised: 02/16/2016 Document Reviewed: 07/26/2015 Elsevier Interactive Patient Education  2018 [employer]  Exercises The following exercises strengthen the muscles that help to support the back. They also help to keep the lower back flexible. Doing these exercises can help to prevent back pain or lessen existing pain. If you have back pain or discomfort, try doing these exercises 2-3 times each day or as told by your health care provider. When the pain goes away, do them once each day, but increase the number of times that you repeat the steps for each exercise (do more repetitions). If you do not have back pain or discomfort, do these exercises once each day or as told by your health care provider. Exercises Single Knee to Chest  Repeat these steps 3-5 times for each leg: 1. Lie on your back on a firm bed or the floor with your legs extended. 2. Bring one knee to your chest. Your other leg should stay extended and in contact with the floor. 3. Hold your knee in place by grabbing your knee or thigh. 4. Pull on your knee until you feel a gentle stretch in your lower back. 5. Hold the stretch for 10-30 seconds. 6. Slowly release and straighten your leg.  Pelvic Tilt  Repeat these steps 5-10 times: 1. Lie on your back on a firm bed or the floor with your legs extended. 2. Bend your knees so they are pointing toward the ceiling and your  feet are flat on the floor. 3. Tighten your lower abdominal muscles to press your lower back against the floor. This motion will tilt your pelvis so your tailbone points up toward the ceiling instead of pointing to your feet or the floor. 4. With gentle tension and even breathing, hold this position for 5-10 seconds.  Cat-Cow  Repeat these steps until your lower back becomes more flexible: 1. Get into a hands-and-knees position on a firm surface. Keep your hands under your shoulders, and keep your knees under your hips. You may place padding under your knees for comfort. 2. Let your head hang down, and point your tailbone toward the floor so your lower back becomes rounded like the back of a cat. 3. Hold this position for 5 seconds. 4. Slowly lift your head and point your tailbone up toward the ceiling so your back forms a sagging arch like the back of a cow. 5. Hold this position for 5 seconds.  Press-Ups  Repeat these steps 5-10 times: 1. Lie on your abdomen (face-down) on the floor. 2. Place your palms near your head, about shoulder-width apart. 3. While you keep your back as relaxed as possible and keep your hips on the floor, slowly straighten your arms to raise the top half of your body and lift your shoulders. Do not use your back muscles to raise your upper torso. You may adjust the placement of your hands to make yourself more comfortable. 4. Hold this position for 5 seconds while you keep your back relaxed. 5. Slowly return to lying flat on the floor.  Bridges  Repeat these steps 10 times: 1. Lie on your back on a firm surface. 2. Bend your knees so they are pointing toward the ceiling and your feet are flat on the floor. 3. Tighten your buttocks muscles and lift your buttocks off of the floor until your waist is at almost the same height as your knees. You should feel the muscles working in your buttocks and the back of your thighs. If you do not feel these muscles, slide your  feet 1-2 inches farther away  from your buttocks. 4. Hold this position for 3-5 seconds. 5. Slowly lower your hips to the starting position, and allow your buttocks muscles to relax completely.  If this exercise is too easy, try doing it with your arms crossed over your chest. Abdominal Crunches  Repeat these steps 5-10 times: 1. Lie on your back on a firm bed or the floor with your legs extended. 2. Bend your knees so they are pointing toward the ceiling and your feet are flat on the floor. 3. Cross your arms over your chest. 4. Tip your chin slightly toward your chest without bending your neck. 5. Tighten your abdominal muscles and slowly raise your trunk (torso) high enough to lift your shoulder blades a tiny bit off of the floor. Avoid raising your torso higher than that, because it can put too much stress on your low back and it does not help to strengthen your abdominal muscles. 6. Slowly return to your starting position.  Back Lifts Repeat these steps 5-10 times: 1. Lie on your abdomen (face-down) with your arms at your sides, and rest your forehead on the floor. 2. Tighten the muscles in your legs and your buttocks. 3. Slowly lift your chest off of the floor while you keep your hips pressed to the floor. Keep the back of your head in line with the curve in your back. Your eyes should be looking at the floor. 4. Hold this position for 3-5 seconds. 5. Slowly return to your starting position.  Contact a health care provider if:  Your back pain or discomfort gets much worse when you do an exercise.  Your back pain or discomfort does not lessen within 2 hours after you exercise. If you have any of these problems, stop doing these exercises right away. Do not do them again unless your health care provider says that you can. Get help right away if:  You develop sudden, severe back pain. If this happens, stop doing the exercises right away. Do not do them again unless your health care  provider says that you can. This information is not intended to replace advice given to you by your health care provider. Make sure you discuss any questions you have with your health care provider. Document Released: 10/12/2004 Document Revised: 01/12/2016 Document Reviewed: 10/29/2014 Elsevier Interactive Patient Education  2017 [employer].

## 2017-12-28 NOTE — Progress Notes (Signed)
Subjective:    Patient ID: [name], female    DOB: Jun 07, 1951, 67 y.o.   MRN: [id number]  The patient is a 67 year old female who presents with complaints of left hip pain left low back pain that radiates down to the left lower extremity.  Patient states pain started about 3-4 days ago.  Patient states she did do some lifting of boxes with shelving in that and noticed the pain at that time.  Patient denies any other possibility of injury.  Patient did complete an E visit today in which she was prescribed etodolac.  Patient denies any other urinary symptoms no frequency urgency hesitancy dysuria or abdominal pain.  Patient does have diabetes history.  Patient is currently noncompliant with medications and discussed at length the importance of restarting her metformin until she can see her primary care physician.  Patient does not recall what her last A1c was.  Patient states she will restart her metformin until she can see her PCP at the end of May.   Review of Systems  Constitutional: Negative.   Eyes: Negative.   Respiratory: Negative.   Cardiovascular: Negative.   Genitourinary: Positive for flank pain. Negative for difficulty urinating, dysuria, frequency and urgency.  Musculoskeletal:       Left-sided hip pain with radiation down lower extremity.       Objective:   Physical Exam  Constitutional: She is oriented to person, place, and time. She appears well-developed and well-nourished. No distress.  Due to left hip pain  Neck: Normal range of motion. Neck supple. No thyromegaly present.  Cardiovascular: Normal rate, regular rhythm and normal heart sounds.  Pulmonary/Chest: Effort normal and breath sounds normal.  Abdominal: Soft. Bowel sounds are normal. She exhibits no distension. There is no tenderness.  Genitourinary: No vaginal discharge found.  Musculoskeletal:  Tenderness to left lower back.  Limited range of motion due to patient condition.  Patient able to perform  flexion and extension while standing, patient able to perform leg lifts bilaterally while sitting on exam table.  Point tenderness to left lower back.  Neurological: She is alert and oriented to person, place, and time. No cranial nerve deficit.  Skin: Skin is warm and dry.  Psychiatric: She has a normal mood and affect.    Urinalysis: Glucose 2+ Bili: negative Ketones: Negative Specific gravity: 1.000 Blood: Negative PH: 6.0 Protein: Negative Uro: 0.2 Nitrite: Negative Leukocytes: Small      Assessment & Plan:  Acute Urinary Tract Infection 1.  Patient with several other comorbidities.  Patient states she is noncompliant with medications.  Discussed in length with patient the importance of restarting her metformin due to the long duration before her next scheduled appointment.  Patient instructed to attempt to take metformin as she can tolerate it if not to follow-up with primary care physician. 2. Patient instructed to increase fluids.  Patient instructed to avoid caffeine, sodas, or tea, while being treated for UTI.  Patient also instructed to consider a toileting schedule.  Sciatica 1. Patient instructed to pick up prescription given for a visit for back pain.  Patient will take etodolac as prescribed for sciatica.  Did discuss at length the importance of attempting to perform back exercises during symptom onset.  Patient also instructed to use heat.  Patient told she can also use a pillow to sleep or help her be comfortable while in bed. 2.  Patient may also consider physical therapy once she sees her PCP to help with sciatica treatment.  3.  She is instructed to go to the ER she develops loss of bowel or bladder function weakness in lower extremities or difficulty ambulating. Meds ordered this encounter  Medications  . sulfamethoxazole-trimethoprim (BACTRIM DS) 800-160 MG tablet    Sig: Take 1 tablet by mouth 2 (two) times daily for 3 days.    Dispense:  6 tablet    Refill:  0     Order Specific Question:   Supervising Provider    Answer:   Ricard Dillon [id number]

## 2018-01-09 ENCOUNTER — Telehealth: Payer: Self-pay

## 2018-02-13 DIAGNOSIS — E559 Vitamin D deficiency, unspecified: Secondary | ICD-10-CM | POA: Diagnosis not present

## 2018-02-13 DIAGNOSIS — E785 Hyperlipidemia, unspecified: Secondary | ICD-10-CM | POA: Diagnosis not present

## 2018-02-13 DIAGNOSIS — E611 Iron deficiency: Secondary | ICD-10-CM | POA: Diagnosis not present

## 2018-02-13 DIAGNOSIS — I10 Essential (primary) hypertension: Secondary | ICD-10-CM | POA: Diagnosis not present

## 2018-02-13 DIAGNOSIS — E1142 Type 2 diabetes mellitus with diabetic polyneuropathy: Secondary | ICD-10-CM | POA: Diagnosis not present

## 2018-02-13 DIAGNOSIS — E039 Hypothyroidism, unspecified: Secondary | ICD-10-CM | POA: Diagnosis not present

## 2018-02-13 DIAGNOSIS — E119 Type 2 diabetes mellitus without complications: Secondary | ICD-10-CM | POA: Diagnosis not present

## 2018-02-13 MED FILL — SYNTHROID 125 MCG TABLET: 125 | 90 days supply | Qty: 90 | Fill #0

## 2018-02-13 MED FILL — TELMISARTAN-HCTZ 80-25 MG T: 80-25 | 90 days supply | Qty: 90 | Fill #0

## 2018-02-13 MED FILL — GABAPENTIN 300 MG CAPSULE: 300 | 90 days supply | Qty: 180 | Fill #0

## 2018-02-13 MED FILL — ATORVASTATIN 20 MG TABLET: 20 | 90 days supply | Qty: 90 | Fill #0

## 2018-02-15 MED FILL — METFORMIN HCL ER 500 MG TAB: 500 | 90 days supply | Qty: 180 | Fill #0

## 2018-03-13 ENCOUNTER — Encounter: Payer: Self-pay | Admitting: Family Medicine

## 2018-03-13 ENCOUNTER — Ambulatory Visit (INDEPENDENT_AMBULATORY_CARE_PROVIDER_SITE_OTHER): Payer: Self-pay | Admitting: Family Medicine

## 2018-03-13 VITALS — BP 132/72 | HR 70 | Temp 97.8°F | Ht 65.0 in | Wt 228.0 lb

## 2018-03-13 DIAGNOSIS — Z0000 Encounter for general adult medical examination without abnormal findings: Secondary | ICD-10-CM

## 2018-03-13 NOTE — Patient Instructions (Signed)

## 2018-03-13 NOTE — Progress Notes (Signed)
[name] is a 67 y.o. female who presents today with concerns of a need for a annual physical she has chronic health conditions of hypertension, DM II, and hypothyroidism and is under routine care of her PCP and that they conditions are for the most part controlled.  Review of Systems  Constitutional: Negative for chills, fever and malaise/fatigue.  HENT: Negative for congestion, ear discharge, ear pain, sinus pain and sore throat.   Eyes: Negative.   Respiratory: Negative for cough, sputum production and shortness of breath.   Cardiovascular: Negative.  Negative for chest pain.  Gastrointestinal: Negative for abdominal pain, diarrhea, nausea and vomiting.  Genitourinary: Negative for dysuria, frequency, hematuria and urgency.  Musculoskeletal: Negative for myalgias.  Skin: Negative.   Neurological: Negative for headaches.  Endo/Heme/Allergies: Negative.   Psychiatric/Behavioral: Negative.     O: Vitals:   03/13/18 1742  BP: 132/72  Pulse: 70  Temp: 97.8 F (36.6 C)  SpO2: 96%     Physical Exam  Constitutional: She is oriented to person, place, and time. Vital signs are normal. She appears well-developed and well-nourished. She is active.  Non-toxic appearance. She does not have a sickly appearance.  HENT:  Head: Normocephalic.  Right Ear: Hearing, tympanic membrane, external ear and ear canal normal.  Left Ear: Hearing, tympanic membrane, external ear and ear canal normal.  Nose: Nose normal.  Mouth/Throat: Uvula is midline and oropharynx is clear and moist.  Neck: Normal range of motion. Neck supple.  Cardiovascular: Normal rate, regular rhythm and normal pulses.  Murmur heard. Pulmonary/Chest: Effort normal and breath sounds normal.  Abdominal: Soft. Bowel sounds are normal.  Musculoskeletal: Normal range of motion.  Lymphadenopathy:       Head (right side): No submental and no submandibular adenopathy present.       Head (left side): No submental and no  submandibular adenopathy present.    She has no cervical adenopathy.  Neurological: She is alert and oriented to person, place, and time.  Psychiatric: She has a normal mood and affect. Her speech is normal and behavior is normal. Cognition and memory are normal.  PHQ-9 negative  Vitals reviewed.  A: 1. Physical exam    P: Exam findings, diagnosis etiology and medication use and indications reviewed with patient. Follow- Up and discharge instructions provided. No emergent/urgent issues found on exam.  Patient verbalized understanding of information provided and agrees with plan of care (POC), all questions answered.  1. Physical exam WNL- completed

## 2018-04-24 ENCOUNTER — Ambulatory Visit (INDEPENDENT_AMBULATORY_CARE_PROVIDER_SITE_OTHER): Payer: Self-pay | Admitting: Family Medicine

## 2018-04-24 DIAGNOSIS — R350 Frequency of micturition: Secondary | ICD-10-CM

## 2018-04-24 DIAGNOSIS — R81 Glycosuria: Secondary | ICD-10-CM

## 2018-04-24 DIAGNOSIS — M545 Low back pain: Secondary | ICD-10-CM

## 2018-04-24 LAB — POCT URINALYSIS DIPSTICK
Bilirubin, UA: NEGATIVE
Glucose, UA: POSITIVE — AB
Leukocytes, UA: NEGATIVE
NITRITE UA: NEGATIVE
PH UA: 5 (ref 5.0–8.0)
PROTEIN UA: POSITIVE — AB
RBC UA: NEGATIVE
UROBILINOGEN UA: 1 U/dL

## 2018-04-24 NOTE — Progress Notes (Signed)
[name] is a 67 y.o. female who presents today with concerns of urinary symptoms. Patient who is present with dauchter provided a urine sample and during insurance intake and chief complaint review reports multiple symptoms that required more complex resources than provided at this location. Complaints of upper abdominal pain, intermittent chest pain, bilateral leg pain. Upon provider interview she reports multiple chronic health conditions and hints of potential non-compliance. Patient urged to seek care at [facility] tonight and f/u with PCP in the AM. Patient has used WebMD to gain information about symptoms and is concerned.    Review of Systems  Cardiovascular: Positive for chest pain.  Gastrointestinal: Positive for abdominal pain.  Genitourinary: Positive for frequency.  Skin: Positive for itching.       Patient reports she believes itching is related to Metformin so she reports that she has discontinued the use of this medication in the last 24 hours.    O: There were no vitals filed for this visit.    A: 1. Acute left-sided low back pain, with sciatica presence unspecified    P: Patient not examined after review of chief complaint and results for urine POCT- patient urged to seek care at [facility].  1. Acute left-sided low back pain, with sciatica presence unspecified - POCT urinalysis dipstick  Results for orders placed or performed in visit on 04/24/18 (from the past 24 hour(s))  POCT urinalysis dipstick     Status: Abnormal   Collection Time: 04/24/18  6:20 PM  Result Value Ref Range   Color, UA     Clarity, UA     Glucose, UA Positive (A) Negative   Bilirubin, UA neg    Ketones, UA trace    Spec Grav, UA >=1.030 (A) 1.010 - 1.025   Blood, UA neg    pH, UA 5.0 5.0 - 8.0   Protein, UA Positive (A) Negative   Urobilinogen, UA 1.0 0.2 or 1.0 E.U./dL   Nitrite, UA neg    Leukocytes, UA Negative Negative   Appearance     Odor

## 2018-04-24 NOTE — Patient Instructions (Signed)
Seek care at higher level of care and then follow up with PCP for evaluation of chronic conditions.

## 2018-04-25 DIAGNOSIS — M79671 Pain in right foot: Secondary | ICD-10-CM | POA: Diagnosis not present

## 2018-04-25 DIAGNOSIS — I10 Essential (primary) hypertension: Secondary | ICD-10-CM | POA: Diagnosis not present

## 2018-04-25 DIAGNOSIS — L299 Pruritus, unspecified: Secondary | ICD-10-CM | POA: Diagnosis not present

## 2018-04-25 DIAGNOSIS — M546 Pain in thoracic spine: Secondary | ICD-10-CM | POA: Diagnosis not present

## 2018-04-25 DIAGNOSIS — K210 Gastro-esophageal reflux disease with esophagitis: Secondary | ICD-10-CM | POA: Diagnosis not present

## 2018-04-26 ENCOUNTER — Telehealth: Payer: Self-pay

## 2018-04-26 MED FILL — CYCLOBENZAPRINE 5 MG TABLET: 5 | 10 days supply | Qty: 30 | Fill #0

## 2018-04-26 NOTE — Telephone Encounter (Signed)
Patient states she feels good after the visit with her PCP, because we were not able to help her with the chief complaint and she has a follow up visit with her PCP on the 28.

## 2018-05-15 DIAGNOSIS — I10 Essential (primary) hypertension: Secondary | ICD-10-CM | POA: Diagnosis not present

## 2018-05-15 DIAGNOSIS — M546 Pain in thoracic spine: Secondary | ICD-10-CM | POA: Diagnosis not present

## 2018-05-15 DIAGNOSIS — E119 Type 2 diabetes mellitus without complications: Secondary | ICD-10-CM | POA: Diagnosis not present

## 2018-05-15 DIAGNOSIS — L299 Pruritus, unspecified: Secondary | ICD-10-CM | POA: Diagnosis not present

## 2018-05-15 DIAGNOSIS — E039 Hypothyroidism, unspecified: Secondary | ICD-10-CM | POA: Diagnosis not present

## 2018-05-15 MED FILL — TELMISARTAN-HCTZ 80-25 MG T: 80-25 | 90 days supply | Qty: 90 | Fill #0

## 2018-05-15 MED FILL — valACYclovir HCL 1 GM TABS: 1 | 7 days supply | Qty: 21 | Fill #0

## 2018-05-15 MED FILL — SYNTHROID 125 MCG TABLET: 125 | 90 days supply | Qty: 90 | Fill #1

## 2018-05-15 MED FILL — ATORVASTATIN CALCIUM 20 MG: 20 | 90 days supply | Qty: 90 | Fill #0

## 2018-06-10 ENCOUNTER — Other Ambulatory Visit: Payer: Self-pay | Admitting: Obstetrics and Gynecology

## 2018-06-10 ENCOUNTER — Other Ambulatory Visit: Payer: Self-pay | Admitting: Gynecology

## 2018-06-10 DIAGNOSIS — Z1231 Encounter for screening mammogram for malignant neoplasm of breast: Secondary | ICD-10-CM

## 2018-06-13 DIAGNOSIS — M546 Pain in thoracic spine: Secondary | ICD-10-CM | POA: Diagnosis not present

## 2018-06-13 DIAGNOSIS — E119 Type 2 diabetes mellitus without complications: Secondary | ICD-10-CM | POA: Diagnosis not present

## 2018-06-13 DIAGNOSIS — E785 Hyperlipidemia, unspecified: Secondary | ICD-10-CM | POA: Diagnosis not present

## 2018-06-13 DIAGNOSIS — M25511 Pain in right shoulder: Secondary | ICD-10-CM | POA: Diagnosis not present

## 2018-06-13 DIAGNOSIS — Z7984 Long term (current) use of oral hypoglycemic drugs: Secondary | ICD-10-CM | POA: Diagnosis not present

## 2018-06-18 DIAGNOSIS — N644 Mastodynia: Secondary | ICD-10-CM | POA: Diagnosis not present

## 2018-06-18 DIAGNOSIS — B0229 Other postherpetic nervous system involvement: Secondary | ICD-10-CM | POA: Diagnosis not present

## 2018-06-18 DIAGNOSIS — Z01419 Encounter for gynecological examination (general) (routine) without abnormal findings: Secondary | ICD-10-CM | POA: Diagnosis not present

## 2018-06-18 DIAGNOSIS — Z6838 Body mass index (BMI) 38.0-38.9, adult: Secondary | ICD-10-CM | POA: Diagnosis not present

## 2018-06-18 DIAGNOSIS — N898 Other specified noninflammatory disorders of vagina: Secondary | ICD-10-CM | POA: Diagnosis not present

## 2018-06-18 DIAGNOSIS — M79601 Pain in right arm: Secondary | ICD-10-CM | POA: Diagnosis not present

## 2018-06-18 DIAGNOSIS — N952 Postmenopausal atrophic vaginitis: Secondary | ICD-10-CM | POA: Diagnosis not present

## 2018-06-18 MED FILL — TERCONAZOLE 0.4% VAG CREAM: 0.4 | 7 days supply | Qty: 45 | Fill #0

## 2018-06-18 MED FILL — NYSTATIN 100,000 UNIT/GM CR: 100000 | 14 days supply | Qty: 30 | Fill #0

## 2018-06-19 ENCOUNTER — Other Ambulatory Visit: Payer: Self-pay | Admitting: Obstetrics and Gynecology

## 2018-06-19 DIAGNOSIS — N644 Mastodynia: Secondary | ICD-10-CM

## 2018-06-25 ENCOUNTER — Ambulatory Visit
Admission: RE | Admit: 2018-06-25 | Discharge: 2018-06-25 | Disposition: A | Discharge status: Home / Self Care | Payer: 59 | Source: Ambulatory Visit | Attending: Obstetrics and Gynecology | Admitting: Obstetrics and Gynecology

## 2018-06-25 ENCOUNTER — Other Ambulatory Visit: Payer: Self-pay | Admitting: Obstetrics and Gynecology

## 2018-06-25 DIAGNOSIS — N6312 Unspecified lump in the right breast, upper inner quadrant: Secondary | ICD-10-CM | POA: Diagnosis not present

## 2018-06-25 DIAGNOSIS — N6310 Unspecified lump in the right breast, unspecified quadrant: Secondary | ICD-10-CM

## 2018-06-25 DIAGNOSIS — N6489 Other specified disorders of breast: Secondary | ICD-10-CM | POA: Diagnosis not present

## 2018-06-25 DIAGNOSIS — N644 Mastodynia: Secondary | ICD-10-CM

## 2018-06-25 DIAGNOSIS — N6320 Unspecified lump in the left breast, unspecified quadrant: Secondary | ICD-10-CM

## 2018-06-25 DIAGNOSIS — N6311 Unspecified lump in the right breast, upper outer quadrant: Secondary | ICD-10-CM | POA: Diagnosis not present

## 2018-06-25 DIAGNOSIS — R922 Inconclusive mammogram: Secondary | ICD-10-CM | POA: Diagnosis not present

## 2018-07-11 MED FILL — TERCONAZOLE 0.4% VAG CREAM: 0.4 | 7 days supply | Qty: 45 | Fill #1

## 2018-07-17 MED FILL — TRIAMCINOLONE 0.1% CREAM: 0.1 | 15 days supply | Qty: 30 | Fill #0

## 2018-07-17 MED FILL — FLUCONAZOLE 150 MG TABS: 150 | 3 days supply | Qty: 2 | Fill #0

## 2018-08-28 DIAGNOSIS — M5416 Radiculopathy, lumbar region: Secondary | ICD-10-CM | POA: Diagnosis not present

## 2018-08-28 MED FILL — predniSONE 10 MG TABS: 10 | 12 days supply | Qty: 48 | Fill #0

## 2018-09-16 DIAGNOSIS — Z7984 Long term (current) use of oral hypoglycemic drugs: Secondary | ICD-10-CM | POA: Diagnosis not present

## 2018-09-16 DIAGNOSIS — E785 Hyperlipidemia, unspecified: Secondary | ICD-10-CM | POA: Diagnosis not present

## 2018-09-16 DIAGNOSIS — I10 Essential (primary) hypertension: Secondary | ICD-10-CM | POA: Diagnosis not present

## 2018-09-16 DIAGNOSIS — R202 Paresthesia of skin: Secondary | ICD-10-CM | POA: Diagnosis not present

## 2018-09-16 DIAGNOSIS — E039 Hypothyroidism, unspecified: Secondary | ICD-10-CM | POA: Diagnosis not present

## 2018-09-16 DIAGNOSIS — F331 Major depressive disorder, recurrent, moderate: Secondary | ICD-10-CM | POA: Diagnosis not present

## 2018-09-16 DIAGNOSIS — R200 Anesthesia of skin: Secondary | ICD-10-CM | POA: Diagnosis not present

## 2018-09-16 DIAGNOSIS — E119 Type 2 diabetes mellitus without complications: Secondary | ICD-10-CM | POA: Diagnosis not present

## 2018-09-16 DIAGNOSIS — Z6836 Body mass index (BMI) 36.0-36.9, adult: Secondary | ICD-10-CM | POA: Diagnosis not present

## 2018-09-16 MED FILL — metFORMIN HCL ER 500 MG TB2: 500 | 90 days supply | Qty: 360 | Fill #0

## 2018-09-16 MED FILL — GABAPENTIN 300 MG CAPSULE: 300 | 90 days supply | Qty: 180 | Fill #0

## 2018-09-16 MED FILL — ATORVASTATIN CALCIUM 20 MG: 20 | 90 days supply | Qty: 90 | Fill #0

## 2018-09-16 MED FILL — SERTRALINE HCL 50 MG TABLET: 50 | 30 days supply | Qty: 30 | Fill #0

## 2018-09-24 DIAGNOSIS — D229 Melanocytic nevi, unspecified: Secondary | ICD-10-CM | POA: Diagnosis not present

## 2018-09-24 DIAGNOSIS — L309 Dermatitis, unspecified: Secondary | ICD-10-CM | POA: Diagnosis not present

## 2018-09-24 DIAGNOSIS — Z85820 Personal history of malignant melanoma of skin: Secondary | ICD-10-CM | POA: Diagnosis not present

## 2018-10-08 ENCOUNTER — Encounter (HOSPITAL_COMMUNITY): Payer: Self-pay | Admitting: *Deleted

## 2018-10-08 ENCOUNTER — Emergency Department (HOSPITAL_COMMUNITY)
Admission: EM | Admit: 2018-10-08 | Discharge: 2018-10-09 | Disposition: A | Discharge status: Home / Self Care | Payer: Medicare Other | Attending: Emergency Medicine | Admitting: Emergency Medicine

## 2018-10-08 ENCOUNTER — Ambulatory Visit
Admission: RE | Admit: 2018-10-08 | Discharge: 2018-10-08 | Disposition: A | Discharge status: Home / Self Care | Payer: Medicare Other | Source: Ambulatory Visit | Attending: Family Medicine | Admitting: Family Medicine

## 2018-10-08 ENCOUNTER — Other Ambulatory Visit: Payer: Self-pay | Admitting: Family Medicine

## 2018-10-08 ENCOUNTER — Other Ambulatory Visit: Payer: Self-pay

## 2018-10-08 DIAGNOSIS — I10 Essential (primary) hypertension: Secondary | ICD-10-CM | POA: Insufficient documentation

## 2018-10-08 DIAGNOSIS — M545 Low back pain: Secondary | ICD-10-CM | POA: Diagnosis not present

## 2018-10-08 DIAGNOSIS — E039 Hypothyroidism, unspecified: Secondary | ICD-10-CM | POA: Diagnosis not present

## 2018-10-08 DIAGNOSIS — Z8673 Personal history of transient ischemic attack (TIA), and cerebral infarction without residual deficits: Secondary | ICD-10-CM | POA: Insufficient documentation

## 2018-10-08 DIAGNOSIS — Z7982 Long term (current) use of aspirin: Secondary | ICD-10-CM | POA: Diagnosis not present

## 2018-10-08 DIAGNOSIS — Z87891 Personal history of nicotine dependence: Secondary | ICD-10-CM | POA: Diagnosis not present

## 2018-10-08 DIAGNOSIS — Z7984 Long term (current) use of oral hypoglycemic drugs: Secondary | ICD-10-CM | POA: Diagnosis not present

## 2018-10-08 DIAGNOSIS — N3000 Acute cystitis without hematuria: Secondary | ICD-10-CM | POA: Diagnosis not present

## 2018-10-08 DIAGNOSIS — R109 Unspecified abdominal pain: Secondary | ICD-10-CM

## 2018-10-08 DIAGNOSIS — R8290 Unspecified abnormal findings in urine: Secondary | ICD-10-CM | POA: Diagnosis not present

## 2018-10-08 DIAGNOSIS — E119 Type 2 diabetes mellitus without complications: Secondary | ICD-10-CM | POA: Insufficient documentation

## 2018-10-08 DIAGNOSIS — Z79899 Other long term (current) drug therapy: Secondary | ICD-10-CM | POA: Insufficient documentation

## 2018-10-08 DIAGNOSIS — N200 Calculus of kidney: Secondary | ICD-10-CM

## 2018-10-08 DIAGNOSIS — R1031 Right lower quadrant pain: Secondary | ICD-10-CM | POA: Diagnosis present

## 2018-10-08 LAB — COMPREHENSIVE METABOLIC PANEL
ALT: 25 U/L (ref 0–44)
ANION GAP: 9 (ref 5–15)
AST: 26 U/L (ref 15–41)
Albumin: 3.8 g/dL (ref 3.5–5.0)
Alkaline Phosphatase: 55 U/L (ref 38–126)
BUN: 21 mg/dL (ref 8–23)
CALCIUM: 9.1 mg/dL (ref 8.9–10.3)
CHLORIDE: 105 mmol/L (ref 98–111)
CO2: 25 mmol/L (ref 22–32)
Creatinine, Ser: 0.71 mg/dL (ref 0.44–1.00)
GFR calc Af Amer: >60 mL/min (ref >60)
Glucose, Bld: 177 mg/dL — ABNORMAL HIGH (ref 70–99)
Potassium: 4 mmol/L (ref 3.5–5.1)
SODIUM: 139 mmol/L (ref 135–145)
Total Bilirubin: 0.3 mg/dL (ref 0.3–1.2)
Total Protein: 7 g/dL (ref 6.5–8.1)

## 2018-10-08 LAB — CBC
HCT: 39.2 % (ref 36.0–46.0)
HEMOGLOBIN: 12.4 g/dL (ref 12.0–15.0)
MCH: 27.8 pg (ref 26.0–34.0)
MCHC: 31.6 g/dL (ref 30.0–36.0)
MCV: 87.9 fL (ref 80.0–100.0)
NRBC: 0 % (ref 0.0–0.2)
PLATELETS: 282 10*3/uL (ref 150–400)
RBC: 4.46 MIL/uL (ref 3.87–5.11)
RDW: 14.7 % (ref 11.5–15.5)
WBC: 11.6 10*3/uL — ABNORMAL HIGH (ref 4.0–10.5)

## 2018-10-08 LAB — URINALYSIS, ROUTINE W REFLEX MICROSCOPIC
Bilirubin Urine: NEGATIVE
Glucose, UA: 150 mg/dL — AB
Hgb urine dipstick: NEGATIVE
Ketones, ur: 5 mg/dL — AB
Nitrite: NEGATIVE
PROTEIN: 30 mg/dL — AB
Specific Gravity, Urine: 1.036 — ABNORMAL HIGH (ref 1.005–1.030)
WBC, UA: >50 WBC/hpf — ABNORMAL HIGH (ref 0–5)
pH: 5 (ref 5.0–8.0)

## 2018-10-08 LAB — LIPASE, BLOOD: LIPASE: 40 U/L (ref 11–51)

## 2018-10-08 MED ORDER — SODIUM CHLORIDE 0.9 % IV SOLN
1.0000 g | Freq: Once | INTRAVENOUS | Status: AC
  Administered 2018-10-08: 1 g via INTRAVENOUS
  Filled 2018-10-08: qty 10

## 2018-10-08 MED ORDER — MORPHINE SULFATE (PF) 4 MG/ML IV SOLN
4.0000 mg | Freq: Once | INTRAVENOUS | Status: AC
  Administered 2018-10-08: 4 mg via INTRAVENOUS
  Filled 2018-10-08: qty 1

## 2018-10-08 MED ORDER — ONDANSETRON HCL 4 MG/2ML IJ SOLN
4.0000 mg | Freq: Once | INTRAMUSCULAR | Status: AC
  Administered 2018-10-08: 4 mg via INTRAVENOUS
  Filled 2018-10-08: qty 2

## 2018-10-08 MED FILL — traMADol HCL 50 MG TABS: 50 | 10 days supply | Qty: 10 | Fill #0

## 2018-10-08 NOTE — ED Provider Notes (Signed)
[facility] DEPT Provider Note   CSN: [id number] Arrival date & time: 10/08/18  2143     History   Chief Complaint Chief Complaint  Patient presents with  . Abdominal Pain    RLQ    HPI [name] is a 68 y.o. female.  The history is provided by the patient and medical records.    68 y.o. F with hx of anemia, elevated LFT's, GERD, HLP, HTN, hypothyroidism, presenting to the ED for right-sided flank pain since Thursday.  Pain localized to right back with some radiation down to right side of her abdomen.  Pain seems to come in waves.  States she tried medicating herself at home without much relief.  She did see her primary care doctor at [facility] earlier today and had urinalysis with noncontrast CT performed but has not yet been provided the results.  Reports she was given a shot of Toradol in the office and sent home with tramadol but has still not had any significant relief.  Reports just prior to arrival she urinated and had excruciating pain in her groin that felt like a "severe UTI" along with foul smelling urine.  Reports she has had similar in the past.  States she has had some nausea but denies vomiting.  No fevers.  No prior abdominal surgeries.  Past Medical History:  Diagnosis Date  . Colon polyps   . Deficiency anemia 07/06/2014  . Diabetes mellitus   . Elevated LFTs [facility]   lobular hepatitis and fibrosis on biopsy  . GERD (gastroesophageal reflux disease)   . Heart murmur   . Hypercholesteremia   . Hypertension   . Hypothyroidism   . Inflammation of labium 11/01/15   with benign biopsy of right labium  . Leukocytosis 07/06/2014  . Melanoma ([place]) 8/06 L cheek(clarks level 2) DrTafeen  . Sleep apnea, obstructive severe,02/2010   not using CPAP  . TIA (transient ischemic attack) 05/2007  . TIA (transient ischemic attack) 2009  . Vitamin D deficiency     Patient Active Problem List   Diagnosis Date Noted  . Leukocytosis 07/06/2014    . Deficiency anemia 07/06/2014  . History of melanoma 07/06/2014  . Preventive measure 07/06/2014  . Type II or unspecified type diabetes mellitus without mention of complication, uncontrolled 05/12/2013  . Noncompliance with medication regimen 08/26/2012  . Tobacco use disorder 11/29/2011  . Type 2 diabetes mellitus without complication ([place]) 27/74/1287  . Depressive disorder, not elsewhere classified 05/01/2011  . Essential hypertension, benign 05/01/2011  . Pure hypercholesterolemia 05/01/2011  . Unspecified hypothyroidism 05/01/2011    Past Surgical History:  Procedure Laterality Date  . BREAST SURGERY  benign breast tumor removed 1974  . cardiolite stress  normal  11/04 (Dr. Jaci Standard)  . CATARACT EXTRACTION W/PHACO  11/28/2011   Procedure: CATARACT EXTRACTION PHACO AND INTRAOCULAR LENS PLACEMENT (IOC);  Surgeon: Elta Guadeloupe T. Gershon Crane, MD;  Location: [facility];  Service: Ophthalmology;  Laterality: Left;  CDE 6.72  . CESAREAN SECTION  x 1; 1987  . COLONOSCOPY  DrHayes  . DILATION AND CURETTAGE OF UTERUS  11/2007  . HEMORRHOID SURGERY  external  . MELANOMA EXCISION  L cheek 8/06  . OTHER SURGICAL HISTORY Left 11/01/15   left labium     OB History   No obstetric history on file.      Home Medications    Prior to Admission medications   Medication Sig Start Date End Date Taking? Authorizing Provider  aspirin 325 MG EC  tablet Take 325 mg by mouth daily.    [provider]  atorvastatin (LIPITOR) 20 MG tablet Take 20 mg by mouth daily.    [provider]  baclofen (LIORESAL) 10 MG tablet Take 1 tablet (10 mg total) by mouth every 8 (eight) hours as needed for muscle spasms. 12/28/17   Withrow, Elyse Jarvis, FNP  cholecalciferol (VITAMIN D) 1000 units tablet Take 2,000 Units by mouth 2 (two) times daily.    [provider]  dicyclomine (BENTYL) 20 MG tablet Take 1 tablet (20 mg total) by mouth 4 (four) times daily -  before meals and at bedtime. 06/03/17 06/08/17   Duffy Bruce, MD  ergocalciferol (VITAMIN D2) 50000 units capsule Take 50,000 Units by mouth once a week.    [provider]  etodolac (LODINE) 300 MG capsule Take 1 capsule (300 mg total) by mouth 2 (two) times daily. 12/28/17   Withrow, Elyse Jarvis, FNP  Ferrous Sulfate (IRON) 325 (65 Fe) MG TABS Take 1 tablet by mouth.    [provider]  ibuprofen (ADVIL,MOTRIN) 200 MG tablet Take 200 mg by mouth every 8 (eight) hours as needed for headache. Reported on 02/22/2016    [provider]  metFORMIN (GLUCOPHAGE) 500 MG tablet Take 500 mg by mouth 2 (two) times daily. 12/29/15   [provider]  omeprazole (PRILOSEC) 20 MG capsule Take 1 capsule (20 mg total) by mouth daily. Take 30 minutes before eating, first thing in the morning 06/03/17 06/17/17  Duffy Bruce, MD  ondansetron (ZOFRAN) 8 MG tablet Take 1 tablet (8 mg total) by mouth every 8 (eight) hours as needed for nausea or vomiting. Patient not taking: Reported on 09/06/2016 02/23/16   Molpus, Jenny Reichmann, MD  SYNTHROID 125 MCG tablet Take 1 tablet (125 mcg total) by mouth daily. Patient not taking: Reported on 09/06/2016 03/31/13   Rita Ohara, MD  telmisartan-hydrochlorothiazide (MICARDIS HCT) 80-25 MG tablet Take 1 tablet by mouth daily.    [provider]  sertraline (ZOLOFT) 100 MG tablet Take 1 tablet (100 mg total) by mouth daily. At bedtime 05/31/11 11/23/11  Rita Ohara, MD    Family History Family History  Problem Relation Age of Onset  . Stroke Mother   . Arthritis Mother        rheumatoid arthritis  . Hyperlipidemia Brother   . Hypertension Brother   . Diabetes Brother     Social History Social History   Tobacco Use  . Smoking status: Former Smoker    Packs/day: 0.10    Types: Cigarettes    Last attempt to quit: 11/18/2012    Years since quitting: 5.8  . Smokeless tobacco: Never Used  . Tobacco comment: quit smoking 2006; recently restarted due to stressors; quit again 11/2012  Substance Use  Topics  . Alcohol use: Yes    Comment: 1 drink once a year  . Drug use: No     Allergies   Ace inhibitors; Acetaminophen; and Metformin and related   Review of Systems Review of Systems  Gastrointestinal: Positive for abdominal pain.  Genitourinary: Positive for flank pain.  All other systems reviewed and are negative.    Physical Exam Updated Vital Signs BP (!) 168/74 (BP Location: Left Arm)   Pulse 70   Temp 97.9 F (36.6 C) (Oral)   Resp 16   Ht 5\' 5"  (1.651 m)   Wt 99.8 kg   SpO2 97%   BMI 36.61 kg/m   Physical Exam Vitals signs and nursing  note reviewed.  Constitutional:      Appearance: She is well-developed.  HENT:     Head: Normocephalic and atraumatic.  Eyes:     Conjunctiva/sclera: Conjunctivae normal.     Pupils: Pupils are equal, round, and reactive to light.  Neck:     Musculoskeletal: Normal range of motion.  Cardiovascular:     Rate and Rhythm: Normal rate and regular rhythm.     Heart sounds: Normal heart sounds.  Pulmonary:     Effort: Pulmonary effort is normal.     Breath sounds: Normal breath sounds.  Abdominal:     General: Bowel sounds are normal.     Palpations: Abdomen is soft.     Tenderness: There is right CVA tenderness.     Comments: Right CVA tenderness to lateral abdomen, no true RUQ tenderness, negative Murphy's sign  Musculoskeletal: Normal range of motion.  Skin:    General: Skin is warm and dry.  Neurological:     Mental Status: She is alert and oriented to person, place, and time.      ED Treatments / Results  Labs (all labs ordered are listed, but only abnormal results are displayed) Labs Reviewed  COMPREHENSIVE METABOLIC PANEL - Abnormal; Notable for the following components:      Result Value   Glucose, Bld 177 (*)    All other components within normal limits  CBC - Abnormal; Notable for the following components:   WBC 11.6 (*)    All other components within normal limits  URINALYSIS, ROUTINE W REFLEX  MICROSCOPIC - Abnormal; Notable for the following components:   APPearance TURBID (*)    Specific Gravity, Urine 1.036 (*)    Glucose, UA 150 (*)    Ketones, ur 5 (*)    Protein, ur 30 (*)    Leukocytes, UA MODERATE (*)    WBC, UA >50 (*)    Bacteria, UA RARE (*)    All other components within normal limits  LIPASE, BLOOD    EKG None  Radiology Ct Abdomen Pelvis Wo Contrast  Result Date: 10/08/2018 CLINICAL DATA:  68 year old female with right back/flank pain for 5 days. Initial encounter. EXAM: CT ABDOMEN AND PELVIS WITHOUT CONTRAST TECHNIQUE: Multidetector CT imaging of the abdomen and pelvis was performed following the standard protocol without IV contrast. COMPARISON:  03/14/2005 CT. 05/31/2011 right upper quadrant ultrasound. FINDINGS: Lower chest: No worrisome lung base abnormality. Heart size within normal limits. Coronary artery calcifications. Aortic valve calcifications. Dense breast parenchyma with coarse calcification on left. Hepatobiliary: Slightly enlarged liver spanning over 18.5 cm. Taking into account limitation by non contrast imaging, no worrisome hepatic lesion. No calcified gallstone or common bile duct stone. No CT evidence of gallbladder inflammation. Pancreas: Taking into account limitation by non contrast imaging, no worrisome pancreatic mass or inflammation. Spleen: Taking into account limitation by non contrast imaging, no splenic mass or enlargement. Adrenals/Urinary Tract: No obstructing stone or hydronephrosis. Taking into account limitation by non contrast imaging, no worrisome renal adrenal or urinary bladder lesion. Tiny amount of gas within the urinary bladder may be related to recent manipulation. Other causes for gas within the urinary bladder such as fistula not identified. Stomach/Bowel: Diverticulosis most notable at the junction of the descending colon and sigmoid colon where there is associated significant muscular hypertrophy but without extraluminal  inflammation. No inflammation of the appendix. Portions of the stomach, small bowel and colon under distended slightly limiting evaluation for detection of a mass. Vascular/Lymphatic: Atherosclerotic changes aorta and  aortic branch vessels. No abdominal aortic aneurysm. Scattered normal size lymph nodes. Reproductive: Uterine fibroids. Some of these are calcified largest calcified fibroid measuring 3.8 cm. Left ovary difficult to separate from possible uterine fibroids. Pelvic sonogram can be obtained for further delineation if clinically desired. No right adnexal lesion noted. Other: No free air or bowel containing hernia. Musculoskeletal: Mild degenerative changes lumbar spine. No worrisome osseous abnormality. IMPRESSION: 1. No obstructing stone or hydronephrosis. 2. Tiny amount of gas within the urinary bladder may be related to recent manipulation. Other causes for gas within the urinary bladder such as fistula not identified. 3. Slightly enlarged liver spanning over 18.5 cm 4. Diverticulosis most notable at the junction of the descending colon and sigmoid colon where there is associated significant muscular hypertrophy but without extraluminal inflammation. 5.  Aortic Atherosclerosis (ICD10-I70.0). 6. Uterine fibroids, some are calcified (largest calcified fibroid measuring 3.8 cm). Left ovary difficult to separate from possible uterine fibroids. Pelvic sonogram can be obtained for further delineation if clinically desired. Electronically Signed   By: Genia Del M.D.   On: 10/08/2018 16:56    Procedures Procedures (including critical care time)  Medications Ordered in ED Medications - No data to display   Initial Impression / Assessment and Plan / ED Course  I have reviewed the triage vital signs and the nursing notes.  Pertinent labs & imaging results that were available during my care of the patient were reviewed by me and considered in my medical decision making (see chart for  details).  68 year old female here with right flank and abdominal pain for about 4 days now.  Saw her PCP earlier today and had CT scan but has not yet been informed of result.  She is afebrile and nontoxic in appearance.  During exam she had bout of pain in the right flank, writhing around and difficult to get comfortable.  Abdomen is overall soft and benign.  CT reviewed from earlier today, does have small amount of gas noted within the bladder but no reported fistula.  She is not had any recent instrumentation such as cystoscopy, catheter placement, etc.  No other acute findings in the abdomen/pelvis. She does report some UTI symptoms today and foul smelling urine.  UA here reveals 21-50 WBCs, large leuks.  She does have quite a few squamous cells, however in light of gas noted within the bladder will opt for treatment.  She was given 1g V Rocephin, morphine, and Zofran.  Screening labs pending.  Will reassess.  12:07 AM Patient feeling much better after medications here.  Vitals stable.  Labs are overall reassuring.  Feel patient is stable for discharge home with continued outpatient treatment.  Plan to d/c home with course of keflex and analgesia.  Given diflucan as she does tend to get yeast infections.  Plan to follow-up with primary care doctor.  She will return here for any new or worsening symptoms.  Final Clinical Impressions(s) / ED Diagnoses   Final diagnoses:  Acute cystitis without hematuria  Flank pain    ED Discharge Orders         Ordered    cephALEXin (KEFLEX) 500 MG capsule  2 times daily     10/09/18 0011    fluconazole (DIFLUCAN) 150 MG tablet     10/09/18 0011    HYDROcodone-acetaminophen (NORCO/VICODIN) 5-325 MG tablet  Every 4 hours PRN     10/09/18 0011           Larene Pickett, PA-C 10/09/18  [id number]    Veryl Speak, MD 10/09/18 [phone]

## 2018-10-08 NOTE — ED Triage Notes (Signed)
Pt seen @ [facility] today for back pain radiating to RLQ, had CT but results have not been provided to pt.  U/A was done but culture results pending.  Was given pain meds,   Pt denies vomiting/nausea.

## 2018-10-09 MED ORDER — FLUCONAZOLE 150 MG PO TABS: ORAL_TABLET | ORAL | 0 refills | Status: DC

## 2018-10-09 MED ORDER — HYDROCODONE-ACETAMINOPHEN 5-325 MG PO TABS: 1.0000 | ORAL_TABLET | ORAL | 0 refills | Status: DC | PRN

## 2018-10-09 MED ORDER — CEPHALEXIN 500 MG PO CAPS: 500.0000 mg | ORAL_CAPSULE | Freq: Two times a day (BID) | ORAL | 0 refills | Status: DC

## 2018-10-09 NOTE — ED Notes (Signed)
Pt and family verbalized discharge instructions and follow up care. Alert and ambulatory. Daughter driving pt home.

## 2018-10-09 NOTE — Discharge Instructions (Signed)
Take the prescribed medication as directed. °Follow-up with your primary care doctor. °Return to the ED for new or worsening symptoms. °

## 2018-10-11 ENCOUNTER — Encounter (HOSPITAL_COMMUNITY): Payer: Self-pay | Admitting: Emergency Medicine

## 2018-10-11 ENCOUNTER — Emergency Department (HOSPITAL_COMMUNITY): Payer: Medicare Other

## 2018-10-11 ENCOUNTER — Emergency Department (HOSPITAL_COMMUNITY)
Admission: EM | Admit: 2018-10-11 | Discharge: 2018-10-11 | Disposition: A | Discharge status: Home / Self Care | Payer: Medicare Other | Attending: Emergency Medicine | Admitting: Emergency Medicine

## 2018-10-11 DIAGNOSIS — Z7984 Long term (current) use of oral hypoglycemic drugs: Secondary | ICD-10-CM | POA: Insufficient documentation

## 2018-10-11 DIAGNOSIS — Z7982 Long term (current) use of aspirin: Secondary | ICD-10-CM | POA: Diagnosis not present

## 2018-10-11 DIAGNOSIS — K5900 Constipation, unspecified: Secondary | ICD-10-CM | POA: Diagnosis not present

## 2018-10-11 DIAGNOSIS — Z8673 Personal history of transient ischemic attack (TIA), and cerebral infarction without residual deficits: Secondary | ICD-10-CM | POA: Diagnosis not present

## 2018-10-11 DIAGNOSIS — R109 Unspecified abdominal pain: Secondary | ICD-10-CM

## 2018-10-11 DIAGNOSIS — Z85828 Personal history of other malignant neoplasm of skin: Secondary | ICD-10-CM | POA: Insufficient documentation

## 2018-10-11 DIAGNOSIS — Z87891 Personal history of nicotine dependence: Secondary | ICD-10-CM | POA: Diagnosis not present

## 2018-10-11 DIAGNOSIS — E119 Type 2 diabetes mellitus without complications: Secondary | ICD-10-CM | POA: Insufficient documentation

## 2018-10-11 DIAGNOSIS — I10 Essential (primary) hypertension: Secondary | ICD-10-CM | POA: Insufficient documentation

## 2018-10-11 DIAGNOSIS — E039 Hypothyroidism, unspecified: Secondary | ICD-10-CM | POA: Diagnosis not present

## 2018-10-11 DIAGNOSIS — Z79899 Other long term (current) drug therapy: Secondary | ICD-10-CM | POA: Diagnosis not present

## 2018-10-11 LAB — CBC
HCT: 40.3 % (ref 36.0–46.0)
Hemoglobin: 12.6 g/dL (ref 12.0–15.0)
MCH: 26.9 pg (ref 26.0–34.0)
MCHC: 31.3 g/dL (ref 30.0–36.0)
MCV: 86.1 fL (ref 80.0–100.0)
NRBC: 0 % (ref 0.0–0.2)
Platelets: 277 10*3/uL (ref 150–400)
RBC: 4.68 MIL/uL (ref 3.87–5.11)
RDW: 14.7 % (ref 11.5–15.5)
WBC: 10.5 10*3/uL (ref 4.0–10.5)

## 2018-10-11 LAB — URINALYSIS, ROUTINE W REFLEX MICROSCOPIC
Bilirubin Urine: NEGATIVE
Glucose, UA: NEGATIVE mg/dL
Hgb urine dipstick: NEGATIVE
Ketones, ur: NEGATIVE mg/dL
Leukocytes, UA: NEGATIVE
Nitrite: NEGATIVE
Protein, ur: NEGATIVE mg/dL
Specific Gravity, Urine: 1.012 (ref 1.005–1.030)
pH: 5 (ref 5.0–8.0)

## 2018-10-11 LAB — COMPREHENSIVE METABOLIC PANEL
ALT: 22 U/L (ref 0–44)
AST: 25 U/L (ref 15–41)
Albumin: 4.3 g/dL (ref 3.5–5.0)
Alkaline Phosphatase: 48 U/L (ref 38–126)
Anion gap: 10 (ref 5–15)
BUN: 15 mg/dL (ref 8–23)
CO2: 29 mmol/L (ref 22–32)
Calcium: 9.5 mg/dL (ref 8.9–10.3)
Chloride: 99 mmol/L (ref 98–111)
Creatinine, Ser: 0.48 mg/dL (ref 0.44–1.00)
GFR calc Af Amer: >60 mL/min (ref >60)
GFR calc non Af Amer: >60 mL/min (ref >60)
Glucose, Bld: 146 mg/dL — ABNORMAL HIGH (ref 70–99)
Potassium: 3.8 mmol/L (ref 3.5–5.1)
Sodium: 138 mmol/L (ref 135–145)
Total Bilirubin: 0.9 mg/dL (ref 0.3–1.2)
Total Protein: 7.7 g/dL (ref 6.5–8.1)

## 2018-10-11 LAB — LIPASE, BLOOD: Lipase: 36 U/L (ref 11–51)

## 2018-10-11 MED ORDER — IOPAMIDOL (ISOVUE-300) INJECTION 61%
100.0000 mL | Freq: Once | INTRAVENOUS | Status: AC | PRN
  Administered 2018-10-11: 100 mL via INTRAVENOUS

## 2018-10-11 MED ORDER — POLYETHYLENE GLYCOL 3350 17 G PO PACK: 17.0000 g | PACK | Freq: Two times a day (BID) | ORAL | 0 refills | Status: DC

## 2018-10-11 MED ORDER — MORPHINE SULFATE (PF) 4 MG/ML IV SOLN
4.0000 mg | INTRAVENOUS | Status: DC | PRN
  Administered 2018-10-11: 4 mg via INTRAVENOUS
  Filled 2018-10-11: qty 1

## 2018-10-11 MED ORDER — SODIUM CHLORIDE (PF) 0.9 % IJ SOLN
INTRAMUSCULAR | Status: AC
  Filled 2018-10-11: qty 50

## 2018-10-11 MED ORDER — IOPAMIDOL (ISOVUE-300) INJECTION 61%
INTRAVENOUS | Status: AC
  Filled 2018-10-11: qty 100

## 2018-10-11 MED ORDER — MORPHINE SULFATE (PF) 4 MG/ML IV SOLN
4.0000 mg | Freq: Once | INTRAVENOUS | Status: AC
  Administered 2018-10-11: 4 mg via INTRAVENOUS
  Filled 2018-10-11: qty 1

## 2018-10-11 NOTE — Discharge Instructions (Addendum)
Eat a high fiber diet  Take the Miralax twice a day  Walk

## 2018-10-11 NOTE — ED Triage Notes (Signed)
Pt still having right flank pain that was seen here for Tuesday night. Reports been constipated since Tuesday and having no relief with Miralax or another laxative.  Denies dysuria, urinary urgency had stopped. Is taking antibiotics that was prescribed when seen here.

## 2018-10-11 NOTE — ED Provider Notes (Signed)
[facility] DEPT Provider Note   CSN: [id number] Arrival date & time: 10/11/18  0919     History   Chief Complaint Chief Complaint  Patient presents with  . Flank Pain  . Constipation    HPI [name] is a 68 y.o. female.  HPI Patient is a 68 year old female with ongoing intermittent right-sided abdominal pain over the past 4 to 5 days.  She was seen in the emergency department several days ago and underwent CT imaging at that time that demonstrated no acute pathology.  She was put on antibiotics for possible urinary tract infection based on her urinalysis.  She reports bilateral abdominal pain right greater than left.  No dysuria or urinary frequency.  No fevers or chills.  Denies nausea and vomiting at this time.  Pain is moderate to severe in severity when it occurs.   Past Medical History:  Diagnosis Date  . Colon polyps   . Deficiency anemia 07/06/2014  . Diabetes mellitus   . Elevated LFTs Hayes   lobular hepatitis and fibrosis on biopsy  . GERD (gastroesophageal reflux disease)   . Heart murmur   . Hypercholesteremia   . Hypertension   . Hypothyroidism   . Inflammation of labium 11/01/15   with benign biopsy of right labium  . Leukocytosis 07/06/2014  . Melanoma ([place]) 8/06 L cheek(clarks level 2) DrTafeen  . Sleep apnea, obstructive severe,02/2010   not using CPAP  . TIA (transient ischemic attack) 05/2007  . TIA (transient ischemic attack) 2009  . Vitamin D deficiency     Patient Active Problem List   Diagnosis Date Noted  . Leukocytosis 07/06/2014  . Deficiency anemia 07/06/2014  . History of melanoma 07/06/2014  . Preventive measure 07/06/2014  . Type II or unspecified type diabetes mellitus without mention of complication, uncontrolled 05/12/2013  . Noncompliance with medication regimen 08/26/2012  . Tobacco use disorder 11/29/2011  . Type 2 diabetes mellitus without complication ([place]) 27/74/1287  . Depressive  disorder, not elsewhere classified 05/01/2011  . Essential hypertension, benign 05/01/2011  . Pure hypercholesterolemia 05/01/2011  . Unspecified hypothyroidism 05/01/2011    Past Surgical History:  Procedure Laterality Date  . BREAST SURGERY  benign breast tumor removed 1974  . cardiolite stress  normal  11/04 (Dr. Jaci Standard)  . CATARACT EXTRACTION W/PHACO  11/28/2011   Procedure: CATARACT EXTRACTION PHACO AND INTRAOCULAR LENS PLACEMENT (IOC);  Surgeon: Elta Guadeloupe T. Gershon Crane, MD;  Location: [facility];  Service: Ophthalmology;  Laterality: Left;  CDE 6.72  . CESAREAN SECTION  x 1; 1987  . COLONOSCOPY  DrHayes  . DILATION AND CURETTAGE OF UTERUS  11/2007  . HEMORRHOID SURGERY  external  . MELANOMA EXCISION  L cheek 8/06  . OTHER SURGICAL HISTORY Left 11/01/15   left labium     OB History   No obstetric history on file.      Home Medications    Prior to Admission medications   Medication Sig Start Date End Date Taking? Authorizing Provider  aspirin EC 81 MG tablet Take 81 mg by mouth daily.   Yes [provider]  atorvastatin (LIPITOR) 20 MG tablet Take 40 mg by mouth daily.    Yes [provider]  cephALEXin (KEFLEX) 500 MG capsule Take 1 capsule (500 mg total) by mouth 2 (two) times daily. 10/09/18  Yes Larene Pickett, PA-C  fluconazole (DIFLUCAN) 150 MG tablet Take 1 tablet by mouth now, Repeat in 72 hours if still having symptoms 10/09/18  Yes Larene Pickett, PA-C  gabapentin (NEURONTIN) 300 MG capsule Take 300 mg by mouth daily as needed (neuropathic pain).   Yes [provider]  glipiZIDE (GLUCOTROL XL) 10 MG 24 hr tablet Take 10 mg by mouth daily with breakfast.   Yes [provider]  ibuprofen (ADVIL,MOTRIN) 200 MG tablet Take 800 mg by mouth every 8 (eight) hours as needed for headache or mild pain. Reported on 02/22/2016   Yes [provider]  metFORMIN (GLUCOPHAGE-XR) 500 MG 24 hr tablet Take 1,000 mg by mouth 2 (two) times daily.   Yes  [provider]  Multiple Vitamins-Minerals (EMERGEN-C IMMUNE PLUS PO) Take 1 Package by mouth daily as needed (immune support).   Yes [provider]  naproxen sodium (ALEVE) 220 MG tablet Take 660 mg by mouth daily as needed (pain).   Yes [provider]  sertraline (ZOLOFT) 50 MG tablet Take 50 mg by mouth daily.   Yes [provider]  SYNTHROID 125 MCG tablet Take 1 tablet (125 mcg total) by mouth daily. 03/31/13  Yes Rita Ohara, MD  telmisartan-hydrochlorothiazide (MICARDIS HCT) 80-25 MG tablet Take 1 tablet by mouth at bedtime.    Yes [provider]  baclofen (LIORESAL) 10 MG tablet Take 1 tablet (10 mg total) by mouth every 8 (eight) hours as needed for muscle spasms. Patient not taking: Reported on 10/08/2018 12/28/17   Withrow, Elyse Jarvis, FNP  dicyclomine (BENTYL) 20 MG tablet Take 1 tablet (20 mg total) by mouth 4 (four) times daily -  before meals and at bedtime. Patient not taking: Reported on 10/08/2018 06/03/17 06/08/17  Duffy Bruce, MD  etodolac (LODINE) 300 MG capsule Take 1 capsule (300 mg total) by mouth 2 (two) times daily. Patient not taking: Reported on 10/08/2018 12/28/17   Benjamine Mola, FNP  omeprazole (PRILOSEC) 20 MG capsule Take 1 capsule (20 mg total) by mouth daily. Take 30 minutes before eating, first thing in the morning Patient not taking: Reported on 10/08/2018 06/03/17 06/17/17  Duffy Bruce, MD  ondansetron (ZOFRAN) 8 MG tablet Take 1 tablet (8 mg total) by mouth every 8 (eight) hours as needed for nausea or vomiting. Patient not taking: Reported on 09/06/2016 02/23/16   Molpus, John, MD  polyethylene glycol (MIRALAX / GLYCOLAX) packet Take 17 g by mouth 2 (two) times daily. 10/11/18   Jola Schmidt, MD    Family History Family History  Problem Relation Age of Onset  . Stroke Mother   . Arthritis Mother        rheumatoid arthritis  . Hyperlipidemia Brother   . Hypertension Brother   . Diabetes Brother     Social  History Social History   Tobacco Use  . Smoking status: Former Smoker    Packs/day: 0.10    Types: Cigarettes    Last attempt to quit: 11/18/2012    Years since quitting: 5.8  . Smokeless tobacco: Never Used  . Tobacco comment: quit smoking 2006; recently restarted due to stressors; quit again 11/2012  Substance Use Topics  . Alcohol use: Yes    Comment: 1 drink once a year  . Drug use: No     Allergies   Ace inhibitors; Acetaminophen; and Metformin and related   Review of Systems Review of Systems  All other systems reviewed and are negative.    Physical Exam Updated Vital Signs BP (!) 170/64 (BP Location: Right Arm)   Pulse 66   Temp 97.7 F (36.5 C) (Oral)  Resp 19   SpO2 96%   Physical Exam Vitals signs and nursing note reviewed.  Constitutional:      General: She is not in acute distress.    Appearance: She is well-developed.  HENT:     Head: Normocephalic and atraumatic.  Neck:     Musculoskeletal: Normal range of motion.  Cardiovascular:     Rate and Rhythm: Normal rate and regular rhythm.     Heart sounds: Normal heart sounds.  Pulmonary:     Effort: Pulmonary effort is normal.     Breath sounds: Normal breath sounds.  Abdominal:     General: There is no distension.     Palpations: Abdomen is soft.     Tenderness: There is no abdominal tenderness.  Musculoskeletal: Normal range of motion.  Skin:    General: Skin is warm and dry.  Neurological:     Mental Status: She is alert and oriented to person, place, and time.  Psychiatric:        Judgment: Judgment normal.      ED Treatments / Results  Labs (all labs ordered are listed, but only abnormal results are displayed) Labs Reviewed  COMPREHENSIVE METABOLIC PANEL - Abnormal; Notable for the following components:      Result Value   Glucose, Bld 146 (*)    All other components within normal limits  URINALYSIS, ROUTINE W REFLEX MICROSCOPIC  CBC  LIPASE, BLOOD     EKG None  Radiology Ct Abdomen Pelvis W Contrast  Result Date: 10/11/2018 CLINICAL DATA:  RIGHT flank pain EXAM: CT ABDOMEN AND PELVIS WITH CONTRAST TECHNIQUE: Multidetector CT imaging of the abdomen and pelvis was performed using the standard protocol following bolus administration of intravenous contrast. CONTRAST:  13mL ISOVUE-300 IOPAMIDOL (ISOVUE-300) INJECTION 61% COMPARISON:  10/08/2018 FINDINGS: Lower chest: Lung bases are clear. Hepatobiliary: No focal hepatic lesion. No biliary duct dilatation. Gallbladder is normal. Common bile duct is normal. Pancreas: Pancreas is normal. No ductal dilatation. No pancreatic inflammation. Spleen: Normal spleen Adrenals/urinary tract: Adrenal glands and kidneys are normal. The ureters and bladder normal. Stomach/Bowel: Stomach, small bowel, appendix, and cecum are normal. Multiple diverticula of the descending colon and sigmoid colon without acute inflammation. Vascular/Lymphatic: Abdominal aorta is normal caliber with atherosclerotic calcification. There is no retroperitoneal or periportal lymphadenopathy. No pelvic lymphadenopathy. Reproductive: Calcified leiomyoma in the posterior aspect of the uterus. No adnexal abnormality Other: No free fluid. Musculoskeletal: No aggressive osseous lesion. IMPRESSION: 1. No acute findings in the abdomen pelvis. 2. Normal appendix. 3. NO obstructive uropathy. 4. LEFT colon diverticulosis without evidence diverticulitis. Electronically Signed   By: Suzy Bouchard M.D.   On: 10/11/2018 13:01    Procedures Procedures (including critical care time)  Medications Ordered in ED Medications  morphine 4 MG/ML injection 4 mg (has no administration in time range)  iopamidol (ISOVUE-300) 61 % injection (has no administration in time range)  sodium chloride (PF) 0.9 % injection (has no administration in time range)  morphine 4 MG/ML injection 4 mg (4 mg Intravenous Given 10/11/18 1123)  iopamidol (ISOVUE-300) 61 %  injection 100 mL (100 mLs Intravenous Contrast Given 10/11/18 1229)     Initial Impression / Assessment and Plan / ED Course  I have reviewed the triage vital signs and the nursing notes.  Pertinent labs & imaging results that were available during my care of the patient were reviewed by me and considered in my medical decision making (see chart for details).     Repeat CT imaging  obtained today which demonstrates large volume stool.  Patient has been encouraged to drink more fluids, exercise more.  Take a high-fiber diet.  Will increase MiraLAX to twice a day.  No other acute pathology found on CT imaging today.   Final Clinical Impressions(s) / ED Diagnoses   Final diagnoses:  Abdominal pain, unspecified abdominal location  Constipation, unspecified constipation type    ED Discharge Orders         Ordered    polyethylene glycol (MIRALAX / GLYCOLAX) packet  2 times daily     10/11/18 1353           Jola Schmidt, MD 10/11/18 1401

## 2018-10-11 NOTE — ED Notes (Signed)
Patient assisted to bathroom to attempt to get a urine sample. Pt reports severe pain during ambulation and is clutching her side. Pt assisted to toilet without incident. Pt reports she is having pain on both sides. Pt's daughter states she is "backed up" and has been on hydrocodone at home for pain.

## 2018-10-16 DIAGNOSIS — R1011 Right upper quadrant pain: Secondary | ICD-10-CM | POA: Diagnosis not present

## 2018-10-16 DIAGNOSIS — F331 Major depressive disorder, recurrent, moderate: Secondary | ICD-10-CM | POA: Diagnosis not present

## 2018-10-16 DIAGNOSIS — E039 Hypothyroidism, unspecified: Secondary | ICD-10-CM | POA: Diagnosis not present

## 2018-10-16 DIAGNOSIS — E119 Type 2 diabetes mellitus without complications: Secondary | ICD-10-CM | POA: Diagnosis not present

## 2018-10-16 MED FILL — HYDROCODON-APAP 5-325: 5-325 | 15 days supply | Qty: 15 | Fill #0

## 2018-10-16 MED FILL — TELMISARTAN-HCTZ 80-25 MG T: 80-25 | 90 days supply | Qty: 90 | Fill #0

## 2018-11-26 ENCOUNTER — Other Ambulatory Visit: Payer: Self-pay | Admitting: Internal Medicine

## 2018-11-26 ENCOUNTER — Ambulatory Visit
Admission: RE | Admit: 2018-11-26 | Discharge: 2018-11-26 | Disposition: A | Discharge status: Home / Self Care | Payer: Medicare Other | Source: Ambulatory Visit | Attending: Internal Medicine | Admitting: Internal Medicine

## 2018-11-26 DIAGNOSIS — M25551 Pain in right hip: Secondary | ICD-10-CM

## 2018-11-26 DIAGNOSIS — M79601 Pain in right arm: Secondary | ICD-10-CM

## 2018-11-27 MED FILL — oxyCODONE HCL 5 MG TABS: 5 | 7 days supply | Qty: 20 | Fill #0

## 2018-11-27 MED FILL — METHOCARBAMOL 500 MG TABS: 500 | 10 days supply | Qty: 30 | Fill #0

## 2018-12-04 ENCOUNTER — Other Ambulatory Visit: Payer: Self-pay | Admitting: Family Medicine

## 2018-12-04 DIAGNOSIS — R1011 Right upper quadrant pain: Secondary | ICD-10-CM

## 2018-12-10 ENCOUNTER — Telehealth (INDEPENDENT_AMBULATORY_CARE_PROVIDER_SITE_OTHER): Payer: Self-pay | Admitting: Radiology

## 2018-12-10 ENCOUNTER — Telehealth (INDEPENDENT_AMBULATORY_CARE_PROVIDER_SITE_OTHER): Payer: Self-pay

## 2018-12-10 NOTE — Telephone Encounter (Signed)
I have called patient and left message to return call about their upcoming appointment. Please ask these questions in pre-screening patient. Thank you!  Do you have now or have you had in the past 7 days a fever and/or chills? Do you have now or have you had in the past 7 days a cough? Do you have now or have you had in the last 7 days nausea, vomiting or abdominal pain? Have you been exposed to anyone who has tested positive for COVID-19? Have you or anyone who lives with you traveled within the last month? 

## 2018-12-10 NOTE — Telephone Encounter (Signed)
Patient returned call to confirm appointment for Wednesday, 12/11/2018 and patient answered NO to all COVID-19 screening questions.

## 2018-12-11 ENCOUNTER — Encounter (INDEPENDENT_AMBULATORY_CARE_PROVIDER_SITE_OTHER): Payer: Self-pay | Admitting: Orthopaedic Surgery

## 2018-12-11 ENCOUNTER — Ambulatory Visit (INDEPENDENT_AMBULATORY_CARE_PROVIDER_SITE_OTHER): Payer: Medicare Other | Admitting: Orthopaedic Surgery

## 2018-12-11 ENCOUNTER — Ambulatory Visit (INDEPENDENT_AMBULATORY_CARE_PROVIDER_SITE_OTHER): Payer: Medicare Other

## 2018-12-11 VITALS — Ht 65.0 in | Wt 228.0 lb

## 2018-12-11 DIAGNOSIS — M25551 Pain in right hip: Secondary | ICD-10-CM | POA: Diagnosis not present

## 2018-12-11 DIAGNOSIS — I10 Essential (primary) hypertension: Secondary | ICD-10-CM

## 2018-12-11 DIAGNOSIS — S7001XA Contusion of right hip, initial encounter: Secondary | ICD-10-CM | POA: Diagnosis not present

## 2018-12-11 DIAGNOSIS — E119 Type 2 diabetes mellitus without complications: Secondary | ICD-10-CM

## 2018-12-11 DIAGNOSIS — M1611 Unilateral primary osteoarthritis, right hip: Secondary | ICD-10-CM

## 2018-12-11 DIAGNOSIS — W010XXA Fall on same level from slipping, tripping and stumbling without subsequent striking against object, initial encounter: Secondary | ICD-10-CM

## 2018-12-11 DIAGNOSIS — M25521 Pain in right elbow: Secondary | ICD-10-CM

## 2018-12-11 DIAGNOSIS — S52134A Nondisplaced fracture of neck of right radius, initial encounter for closed fracture: Secondary | ICD-10-CM | POA: Diagnosis not present

## 2018-12-11 DIAGNOSIS — F17200 Nicotine dependence, unspecified, uncomplicated: Secondary | ICD-10-CM

## 2018-12-11 NOTE — Progress Notes (Signed)
Office Visit Note   Patient: [name]           Date of Birth: 1951/01/19           MRN: [id number] Visit Date: 12/11/2018              Requested by: Marda Stalker, PA-C [place], [place] [address] PCP: Marda Stalker, PA-C   Assessment & Plan: Visit Diagnoses:  1. Pain in right elbow   2. Closed nondisplaced fracture of neck of right radius, initial encounter   3. Contusion of right hip, initial encounter     Plan: Today patient was put in a right arm sling.  She can do some gentle elbow range of motion exercises but definitely nothing too aggressive.  Exercises were demonstrated today.  No lifting, pushing, pulling with right arm.  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  Must wear sling.  Follow-up in 2 weeks for recheck and will reassess work status at that time.  Stressed to patient the importance of being compliant with the use of her sling until she is cleared to come out of it altogether.  We discussed risk of nonunion, radial head displacement, traumatic arthritis, and potential need for surgical intervention with radial head replacement.  She voices understanding.  I think hip will continue to gradually get better.  Follow-Up Instructions: Return in about 2 weeks (around 12/25/2018).   Orders:  Orders Placed This Encounter  Procedures  . XR Elbow Complete Right (3+View)   No orders of the defined types were placed in this encounter.     Procedures: No procedures performed   Clinical Data: No additional findings.   Subjective: Chief Complaint  Patient presents with  . Right Arm - Pain    DOI 11/25/2018  . Right Hip - Pain    DOI 11/25/2018    HPI 68 year old white female comes in today with complaints of right elbow pain and right lateral hip pain.  States that March 05/2019 she was walking outside when she tripped over an object and had a hard fall landing directly onto her right side.  Feels like she may  have blacked out briefly after the fall.  She was able to get up and make it back into the building.  She was seen by her PCP Dr. Deforest Hoyles the next day who ordered x-rays.  Right elbow showed transverse minimally impacted fracture at the radial neck.  Right hip negative for fracture.  Mild osteoarthritis of the hip joint.  Patient was referred to another orthopedic group here in town and placed in a right arm posterior splint.  Patient states that she removed the splint because it was causing increased pressure underneath her arm.  She elected not to return back to that clinic due to feeling like she had a bad experience.  She states that right lateral hip is better.  No complaints of groin pain.  Some soreness over the greater trochanter bursa.  Right elbow continues to be painful.  Increased pain with attempted range of motion.  Intermittent numbness and tingling down her forearm into her hand.  No neck injury.  No complaints of shoulder pain. Review of Systems  Constitutional: Negative for activity change.  Respiratory: Positive for cough. Negative for shortness of breath.   Neurological: Positive for numbness (Intermittent right forearm and hand).     Objective: Vital Signs: Ht 5\' 5"  (1.651 m)   Wt 228  lb (103.4 kg)   BMI 37.94 kg/m   Physical Exam HENT:     Head: Normocephalic.  Eyes:     Extraocular Movements: Extraocular movements intact.     Pupils: Pupils are equal, round, and reactive to light.  Pulmonary:     Effort: No respiratory distress.  Musculoskeletal:     Comments: Gait is normal.  Sore spine good range of motion.  Right shoulder unremarkable.  Right elbow range of motion about 20-130 degrees with pain.  Some limitation as expected with forearm supination and pronation.  Exquisitely tender over the radial head.  Neurovascular intact.  Neurological:     General: No focal deficit present.     Mental Status: She is alert and oriented to person, place, and time.  Psychiatric:         Mood and Affect: Mood normal.     Ortho Exam  Specialty Comments:  No specialty comments available.  Imaging: No results found.   PMFS History: Patient Active Problem List   Diagnosis Date Noted  . Leukocytosis 07/06/2014  . Deficiency anemia 07/06/2014  . History of melanoma 07/06/2014  . Preventive measure 07/06/2014  . Type II or unspecified type diabetes mellitus without mention of complication, uncontrolled 05/12/2013  . Noncompliance with medication regimen 08/26/2012  . Tobacco use disorder 11/29/2011  . Type 2 diabetes mellitus without complication ([place]) 00/93/8182  . Depressive disorder, not elsewhere classified 05/01/2011  . Essential hypertension, benign 05/01/2011  . Pure hypercholesterolemia 05/01/2011  . Unspecified hypothyroidism 05/01/2011   Past Medical History:  Diagnosis Date  . Colon polyps   . Deficiency anemia 07/06/2014  . Diabetes mellitus   . Elevated LFTs Hayes   lobular hepatitis and fibrosis on biopsy  . GERD (gastroesophageal reflux disease)   . Heart murmur   . Hypercholesteremia   . Hypertension   . Hypothyroidism   . Inflammation of labium 11/01/15   with benign biopsy of right labium  . Leukocytosis 07/06/2014  . Melanoma ([place]) 8/06 L cheek(clarks level 2) DrTafeen  . Sleep apnea, obstructive severe,02/2010   not using CPAP  . TIA (transient ischemic attack) 05/2007  . TIA (transient ischemic attack) 2009  . Vitamin D deficiency     Family History  Problem Relation Age of Onset  . Stroke Mother   . Arthritis Mother        rheumatoid arthritis  . Hyperlipidemia Brother   . Hypertension Brother   . Diabetes Brother     Past Surgical History:  Procedure Laterality Date  . BREAST SURGERY  benign breast tumor removed 1974  . cardiolite stress  normal  11/04 (Dr. Jaci Standard)  . CATARACT EXTRACTION W/PHACO  11/28/2011   Procedure: CATARACT EXTRACTION PHACO AND INTRAOCULAR LENS PLACEMENT (IOC);  Surgeon: Elta Guadeloupe T. Gershon Crane, MD;   Location: [facility];  Service: Ophthalmology;  Laterality: Left;  CDE 6.72  . CESAREAN SECTION  x 1; 1987  . COLONOSCOPY  DrHayes  . DILATION AND CURETTAGE OF UTERUS  11/2007  . HEMORRHOID SURGERY  external  . MELANOMA EXCISION  L cheek 8/06  . OTHER SURGICAL HISTORY Left 11/01/15   left labium   Social History   Occupational History  . 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 Smoking status: Former Smoker    Packs/day: 0.10    Types: Cigarettes    Last attempt to quit: 11/18/2012    Years since quitting: 6.0  . Smokeless tobacco: Never Used  .  Tobacco comment: quit smoking 2006; recently restarted due to stressors; quit again 11/2012  Substance and Sexual Activity  . Alcohol use: Yes    Comment: 1 drink once a year  . Drug use: No  . Sexual activity: Not on file

## 2018-12-24 ENCOUNTER — Telehealth (INDEPENDENT_AMBULATORY_CARE_PROVIDER_SITE_OTHER): Payer: Self-pay | Admitting: Radiology

## 2018-12-24 NOTE — Telephone Encounter (Signed)
Patient confirmed appointment for 12/25/2018. She answered "No" to all COVID-19 screening questions.

## 2018-12-25 ENCOUNTER — Telehealth (INDEPENDENT_AMBULATORY_CARE_PROVIDER_SITE_OTHER): Payer: Self-pay | Admitting: Radiology

## 2018-12-25 ENCOUNTER — Ambulatory Visit (INDEPENDENT_AMBULATORY_CARE_PROVIDER_SITE_OTHER): Payer: Medicare Other | Admitting: Orthopaedic Surgery

## 2018-12-25 ENCOUNTER — Ambulatory Visit (INDEPENDENT_AMBULATORY_CARE_PROVIDER_SITE_OTHER): Payer: Self-pay

## 2018-12-25 ENCOUNTER — Encounter (INDEPENDENT_AMBULATORY_CARE_PROVIDER_SITE_OTHER): Payer: Self-pay | Admitting: Orthopaedic Surgery

## 2018-12-25 ENCOUNTER — Other Ambulatory Visit: Payer: Self-pay

## 2018-12-25 VITALS — Ht 65.0 in | Wt 228.0 lb

## 2018-12-25 DIAGNOSIS — M25561 Pain in right knee: Secondary | ICD-10-CM

## 2018-12-25 DIAGNOSIS — R1084 Generalized abdominal pain: Secondary | ICD-10-CM | POA: Diagnosis not present

## 2018-12-25 DIAGNOSIS — M25521 Pain in right elbow: Secondary | ICD-10-CM | POA: Diagnosis not present

## 2018-12-25 NOTE — Progress Notes (Signed)
Office Visit Note   Patient: [name]           Date of Birth: [birth year]           MRN: [id number] Visit Date: 12/25/2018              Requested by: Marda Stalker, PA-C [place], [place] [address] PCP: Marda Stalker, PA-C   Assessment & Plan: Visit Diagnoses:  1. Pain in right elbow   2. Acute pain of right knee   3. Generalized abdominal pain     Plan: For patient's worsening abdominal pain I recommend that she contact her primary care physician's office immediately to see if she needs to be evaluated today.  I do not want to send patient to the emergency room due to current problems with COVID.  Patient advised me that Marda Stalker PA-C had seen her for abdominal pain and study had been ordered last month but not yet completed.  Our system had contacted PA's office and we are advised that care has been transferred to Dr. Deforest Hoyles with [facility].  For right elbow she will continue working gentle range of motion.  Still avoid any pushing pulling lifting or throwing.  Use sling.  Follow-up with Dr. Lorin Mercy in 3 weeks for recheck.  Will work on better control of her blood sugars.  May consider injecting the right knee when she returns.  Follow-Up Instructions: Return in about 3 weeks (around 01/15/2019) for recheck elbow and knee.   Orders:  Orders Placed This Encounter  Procedures   XR Elbow 2 Views Right   XR KNEE 3 VIEW RIGHT   No orders of the defined types were placed in this encounter.     Procedures: No procedures performed   Clinical Data: No additional findings.   Subjective: Chief Complaint  Patient presents with   Right Elbow - Follow-up, Fracture    DOI 11/25/2018    HPI 68 year old white female returns for recheck of her right nondisplaced radial neck fracture.  She is working on range of motion.  States that she did aggravate the elbow recently when she picked up an object and threw it with her right hand.  She also  complaining of knee pain over the last week.  No mechanical symptoms or feeling of instability.  No injury.  Patient does have history of diabetes and states that her blood sugars are not controlled.  Patient is also complaining of worsening abdominal pain.  She did have 2 ER visits for this.  2 CT scans were performed.  She has discussed this problem with her PCP. Review of Systems Patient complains of worsening diffuse abdominal pain but more so in the right upper and lower quadrant area.  Denies fever.  Objective: Vital Signs: Ht 5\' 5"  (1.651 m)    Wt 228 lb (103.4 kg)    BMI 37.94 kg/m   Physical Exam HENT:     Head: Normocephalic and atraumatic.  Eyes:     Pupils: Pupils are equal, round, and reactive to light.  Musculoskeletal:        General: Tenderness (right knee med/lat joint line.  ) present.     Comments: Elbow range of motion lacks 15-20 full extension.  Good flexion.   Right knee no effusion.  Good ROM.  Ligaments stable. Not much by the way of PF crepitus.   Neurological:     Mental Status: She is alert.  Psychiatric:  Mood and Affect: Mood normal.     Ortho Exam  Specialty Comments:  No specialty comments available.  Imaging: No results found.   PMFS History: Patient Active Problem List   Diagnosis Date Noted   Leukocytosis 07/06/2014   Deficiency anemia 07/06/2014   History of melanoma 07/06/2014   Preventive measure 07/06/2014   Type II or unspecified type diabetes mellitus without mention of complication, uncontrolled 05/12/2013   Noncompliance with medication regimen 08/26/2012   Tobacco use disorder 11/29/2011   Type 2 diabetes mellitus without complication ([place]) 00/92/3300   Depressive disorder, not elsewhere classified 05/01/2011   Essential hypertension, benign 05/01/2011   Pure hypercholesterolemia 05/01/2011   Unspecified hypothyroidism 05/01/2011   Past Medical History:  Diagnosis Date   Colon polyps    Deficiency  anemia 07/06/2014   Diabetes mellitus    Elevated LFTs [facility]   lobular hepatitis and fibrosis on biopsy   GERD (gastroesophageal reflux disease)    Heart murmur    Hypercholesteremia    Hypertension    Hypothyroidism    Inflammation of labium 11/01/15   with benign biopsy of right labium   Leukocytosis 07/06/2014   Melanoma ([place]) 8/06 L cheek(clarks level 2) DrTafeen   Sleep apnea, obstructive severe,02/2010   not using CPAP   TIA (transient ischemic attack) 05/2007   TIA (transient ischemic attack) 2009   Vitamin D deficiency     Family History  Problem Relation Age of Onset   Stroke Mother    Arthritis Mother        rheumatoid arthritis   Hyperlipidemia Brother    Hypertension Brother    Diabetes Brother     Past Surgical History:  Procedure Laterality Date   BREAST SURGERY  benign breast tumor removed 1974   cardiolite stress  normal  11/04 (Dr. Jaci Standard)   CATARACT EXTRACTION W/PHACO  11/28/2011   Procedure: CATARACT EXTRACTION PHACO AND INTRAOCULAR LENS PLACEMENT ([facility]);  Surgeon: Elta Guadeloupe T. Gershon Crane, MD;  Location: [facility];  Service: Ophthalmology;  Laterality: Left;  CDE 6.72   CESAREAN SECTION  x 1; 1987   COLONOSCOPY  DrHayes   DILATION AND CURETTAGE OF UTERUS  11/2007   HEMORRHOID SURGERY  external   MELANOMA EXCISION  L cheek 8/06   OTHER SURGICAL HISTORY Left 11/01/15   left labium   Social History   Occupational History   Occupation: Admissions at [employer]  Tobacco Use   Smoking status: Former Smoker    Packs/day: 0.10    Types: Cigarettes    Last attempt to quit: 11/18/2012    Years since quitting: 6.1   Smokeless tobacco: Never Used   Tobacco comment: quit smoking 2006; recently restarted due to stressors; quit again 11/2012  Substance and Sexual Activity   Alcohol use: Yes    Comment: 1 drink once a year   Drug use: No   Sexual activity: Not on file

## 2018-12-25 NOTE — Patient Instructions (Signed)
I recommend that you contact your primary care physician's office today and schedule an appointment to be seen immediately for your worsening abdominal pain.  We did contact Marda Stalker, PA-C office with [facility] at [employer] [employer] and my assistant was advised that you have transferred care to Dr. Deforest Hoyles.  Contact him immediately and advise him of your current symptoms.  For your right knee pain recommend using ice off and on as needed.  Unfortunately with your current abdominal symptoms I cannot recommend you taking any Tylenol or oral NSAIDs.   For right elbow continue working your gentle range of motion.  Avoid any pushing pulling lifting throwing.  Continue sling.

## 2018-12-25 NOTE — Telephone Encounter (Signed)
Patient was in our office today for an appointment and saw Benjiman Core, PA-C.  During the appointment, patient expressed continued abdomen pain that is getting increasingly worse. Jeneen Rinks had me call Kimberlee Nearing, PA-C office to try and make patient a follow up appointment today, or have them expedite the status on the Nuc Med test they have ordered for patient. Once speaking to that office, they state patient has changed her provider to Dr. Deforest Hoyles at [place].    I spoke with patient and advised her that Jeneen Rinks wanted her to go to whichever office is currently treating her for the abdominal pain. Patient explained that she went to Dr. Deforest Hoyles only after she did not get the test ordered from [facility] and she wishes to keep her care with Marda Stalker. I told the patient that she needed to call whichever office is going to take care of her, explain that Jeneen Rinks would like for this abdominal pain to be addressed, and to see what they tell her about an appointment. Patient expressed understanding.

## 2018-12-27 ENCOUNTER — Ambulatory Visit
Admission: RE | Admit: 2018-12-27 | Discharge: 2018-12-27 | Disposition: A | Discharge status: Home / Self Care | Payer: Medicare Other | Source: Ambulatory Visit | Attending: Obstetrics and Gynecology | Admitting: Obstetrics and Gynecology

## 2018-12-27 ENCOUNTER — Other Ambulatory Visit: Payer: Self-pay

## 2018-12-27 ENCOUNTER — Other Ambulatory Visit: Payer: Self-pay | Admitting: Obstetrics and Gynecology

## 2018-12-27 DIAGNOSIS — N63 Unspecified lump in unspecified breast: Secondary | ICD-10-CM

## 2018-12-27 DIAGNOSIS — N6310 Unspecified lump in the right breast, unspecified quadrant: Secondary | ICD-10-CM

## 2019-01-15 ENCOUNTER — Ambulatory Visit (INDEPENDENT_AMBULATORY_CARE_PROVIDER_SITE_OTHER): Payer: Self-pay

## 2019-01-15 ENCOUNTER — Other Ambulatory Visit: Payer: Self-pay

## 2019-01-15 ENCOUNTER — Ambulatory Visit (INDEPENDENT_AMBULATORY_CARE_PROVIDER_SITE_OTHER): Payer: Medicare Other | Admitting: Orthopaedic Surgery

## 2019-01-15 ENCOUNTER — Encounter (INDEPENDENT_AMBULATORY_CARE_PROVIDER_SITE_OTHER): Payer: Self-pay | Admitting: Orthopaedic Surgery

## 2019-01-15 VITALS — Ht 65.0 in | Wt 228.0 lb

## 2019-01-15 DIAGNOSIS — M5441 Lumbago with sciatica, right side: Secondary | ICD-10-CM

## 2019-01-15 DIAGNOSIS — G8929 Other chronic pain: Secondary | ICD-10-CM | POA: Diagnosis not present

## 2019-01-15 DIAGNOSIS — M25521 Pain in right elbow: Secondary | ICD-10-CM | POA: Diagnosis not present

## 2019-01-15 NOTE — Progress Notes (Signed)
Office Visit Note   Patient: [name]           Date of Birth: 08-Jan-1951           MRN: [id number] Visit Date: 01/15/2019              Requested by: Robin Stalker, PA-C [place], [place] [address] PCP: Robin Stalker, PA-C   Assessment & Plan: Visit Diagnoses:  1. Pain in right elbow   2. Chronic right-sided low back pain with right-sided sciatica   3.      Radial neck fracture healing.  Plan: Patient has been having ongoing pain since January she had emergency room visits been on anti-inflammatories.  Pain wakes her up at night and at times is severe.  She does have history of melanoma.  I recommend an MRI scan to rule out pathologic problem with nerve compression.  She does have right hip flexion weakness on exam.  ER exam notes were reviewed that she had in January as well as previous CT abdomen and pelvis done 10/11/2018 and discussed with patient.  Follow-up after MRI scan.  Follow-Up Instructions: No follow-ups on file.   Orders:  Orders Placed This Encounter  Procedures  . XR Elbow Complete Right (3+View)   No orders of the defined types were placed in this encounter.     Procedures: No procedures performed   Clinical Data: No additional findings.   Subjective: Chief Complaint  Patient presents with  . 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  Patient seen for follow-up of right radial neck fracture after a fall when she was throwing some trash out of an office complex.  Patient has been having problems with right lower abdominal pain radiates around into her right groin.  She is used heating pad the point where she has a rash she has had visits to the ER in September 18 2118 also 10/11/2018.  She has had CT scans of the abdomen and pelvis which is positive for diverticulosis but not  diverticulitis.  X-rays of her hip which showed no evidence of avascular necrosis.  She has had increased pain that bothers her at night.  Pain wakes her up at night and she has had a history of melanoma in 2006.  Night pain wakes her up she has to get up and move around.  She notes she has increased pain with forward flexion with extension pain radiates into the right groin.  She notes sometimes she has weakness in her right thigh and has to help lift her right leg when she gets in and out of a car.  She denies chills or fever no associated bowel or bladder symptoms.  Review of Systems Positive for diabetes she is on pills and her A1c has been in the 8-10 range for the last few years.  There is discussion about starting her on insulin.  She is had problems with diabetes history of melanoma 2006 high cholesterol tobacco use disorder some history of noncompliance with her medications, depression, hypertension otherwise negative is obtains HPI.  Objective: Vital Signs: Ht 5\' 5"  (1.651 m)   Wt 228 lb (103.4 kg)   BMI 37.94 kg/m   Physical Exam Constitutional:      Appearance: She is well-developed.  HENT:     Head: Normocephalic.     Right  Ear: External ear normal.     Left Ear: External ear normal.  Eyes:     Pupils: Pupils are equal, round, and reactive to light.  Neck:     Thyroid: No thyromegaly.     Trachea: No tracheal deviation.  Cardiovascular:     Rate and Rhythm: Normal rate.  Pulmonary:     Effort: Pulmonary effort is normal.  Abdominal:     Palpations: Abdomen is soft.  Skin:    General: Skin is warm and dry.  Neurological:     Mental Status: She is alert and oriented to person, place, and time.  Psychiatric:        Behavior: Behavior normal.     Ortho Exam patient has rash over the anterior aspect of the abdomen where she has been applying heating pad and also ice.  She has increased pain forward flexion fingertips to mid tibial level has difficulty getting back up to  upright position.  Weakness with right hip flexion.  No quad atrophy anterior tib gastrocsoleus is strong.  No right plantar foot lesions.  She does have some bilateral decreased sensation stocking distribution consistent with her neuropathy.  Right elbow has good flexion she normal such shoulder with the thumb she only lacks about 2 to 3 degrees reaching full extension on the right elbow there is no effusion no ecchymosis.  Specialty Comments:  No specialty comments available.  Imaging: No results found.   PMFS History: Patient Active Problem List   Diagnosis Date Noted  . Leukocytosis 07/06/2014  . Deficiency anemia 07/06/2014  . History of melanoma 07/06/2014  . Preventive measure 07/06/2014  . Type II or unspecified type diabetes mellitus without mention of complication, uncontrolled 05/12/2013  . Noncompliance with medication regimen 08/26/2012  . Tobacco use disorder 11/29/2011  . Type 2 diabetes mellitus without complication ([place]) 76/28/3151  . Depressive disorder, not elsewhere classified 05/01/2011  . Essential hypertension, benign 05/01/2011  . Pure hypercholesterolemia 05/01/2011  . Unspecified hypothyroidism 05/01/2011   Past Medical History:  Diagnosis Date  . Colon polyps   . Deficiency anemia 07/06/2014  . Diabetes mellitus   . Elevated LFTs Hayes   lobular hepatitis and fibrosis on biopsy  . GERD (gastroesophageal reflux disease)   . Heart murmur   . Hypercholesteremia   . Hypertension   . Hypothyroidism   . Inflammation of labium 11/01/15   with benign biopsy of right labium  . Leukocytosis 07/06/2014  . Melanoma ([place]) 8/06 L cheek(clarks level 2) DrTafeen  . Sleep apnea, obstructive severe,02/2010   not using CPAP  . TIA (transient ischemic attack) 05/2007  . TIA (transient ischemic attack) 2009  . Vitamin D deficiency     Family History  Problem Relation Age of Onset  . Stroke Mother   . Arthritis Mother        rheumatoid arthritis  . Hyperlipidemia  Brother   . Hypertension Brother   . Diabetes Brother     Past Surgical History:  Procedure Laterality Date  . BREAST SURGERY  benign breast tumor removed 1974  . cardiolite stress  normal  11/04 (Dr. Jaci Standard)  . CATARACT EXTRACTION W/PHACO  11/28/2011   Procedure: CATARACT EXTRACTION PHACO AND INTRAOCULAR LENS PLACEMENT (IOC);  Surgeon: Elta Guadeloupe T. Gershon Crane, MD;  Location: [facility];  Service: Ophthalmology;  Laterality: Left;  CDE 6.72  . CESAREAN SECTION  x 1; 1987  . COLONOSCOPY  DrHayes  . DILATION AND CURETTAGE OF UTERUS  11/2007  . HEMORRHOID SURGERY  external  . MELANOMA EXCISION  L cheek 8/06  . OTHER SURGICAL HISTORY Left 11/01/15   left labium   Social History   Occupational History  . 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 Smoking status: Former Smoker    Packs/day: 0.10    Types: Cigarettes    Last attempt to quit: 11/18/2012    Years since quitting: 6.1  . Smokeless tobacco: Never Used  . Tobacco comment: quit smoking 2006; recently restarted due to stressors; quit again 11/2012  Substance and Sexual Activity  . Alcohol use: Yes    Comment: 1 drink once a year  . Drug use: No  . Sexual activity: Not on file

## 2019-01-24 ENCOUNTER — Ambulatory Visit
Admission: RE | Admit: 2019-01-24 | Discharge: 2019-01-24 | Disposition: A | Discharge status: Home / Self Care | Payer: Medicare Other | Source: Ambulatory Visit | Attending: Orthopaedic Surgery | Admitting: Orthopaedic Surgery

## 2019-01-24 ENCOUNTER — Other Ambulatory Visit: Payer: Self-pay

## 2019-01-24 DIAGNOSIS — G8929 Other chronic pain: Secondary | ICD-10-CM

## 2019-01-28 ENCOUNTER — Ambulatory Visit: Payer: Medicare Other | Admitting: Orthopaedic Surgery

## 2019-02-04 ENCOUNTER — Other Ambulatory Visit: Payer: Self-pay

## 2019-02-04 ENCOUNTER — Ambulatory Visit (INDEPENDENT_AMBULATORY_CARE_PROVIDER_SITE_OTHER): Payer: Medicare Other | Admitting: Orthopaedic Surgery

## 2019-02-04 ENCOUNTER — Encounter: Payer: Self-pay | Admitting: Orthopaedic Surgery

## 2019-02-04 VITALS — Ht 65.0 in | Wt 228.0 lb

## 2019-02-04 DIAGNOSIS — M545 Low back pain: Secondary | ICD-10-CM

## 2019-02-04 DIAGNOSIS — G8929 Other chronic pain: Secondary | ICD-10-CM | POA: Diagnosis not present

## 2019-02-04 NOTE — Progress Notes (Signed)
Office Visit Note   Patient: [name]           Date of Birth: Jan 30, 1951           MRN: [id number] Visit Date: 02/04/2019              Requested by: Marda Stalker, PA-C [place], [place] [address] PCP: Marda Stalker, PA-C   Assessment & Plan: Visit Diagnoses:  1. Chronic right-sided low back pain, unspecified whether sciatica present     Plan: We reviewed the MRI scan and gave her a copy of the report and review the images.  She has 3 mm anterolisthesis at L4-5 with an annular tear and some lateral recess stenosis worse on the right than the left at L4-5.  Flexion-extension x-rays were not obtained today.  We will see her back in 2 months when she starts on insulin we could consider an epidural steroid injection however with her elevated A1c and hyperglycemia I would not like to scheduled this at this time.  Recheck 2 months.  On return visit we will obtain lateral lumbar flexion-extension x-rays.  Follow-Up Instructions: Return in about 3 months (around 05/07/2019).   Orders:  No orders of the defined types were placed in this encounter.  No orders of the defined types were placed in this encounter.     Procedures: No procedures performed   Clinical Data: No additional findings.   Subjective: Chief Complaint  Patient presents with   Lower Back - Follow-up    MRI Lumbar Review    HPI 68 year old female returns with ongoing problems with back pain that radiates down to her right lower quadrant into her right leg and down to her right foot.  She has been emergency room and treated with anti-inflammatories which gives her some relief.  She has some discomfort present most of the time but it increases with turning twisting bending.  If she does not take ibuprofen at night sometimes keeps her awake.  She denies fever or chills.  No bowel bladder symptoms.  She did have severe diarrhea when she first started metformin.  She is now on extended  release and states on her follow-up in June she likely will be starting insulin due to continued elevation in her A1c.  Patient's had previous CT scan of the abdomen and pelvis in January.  Review of Systems 14 point system update unchanged from 01/15/2019 office visit.   Objective: Vital Signs: Ht 5\' 5"  (1.651 m)    Wt 228 lb (103.4 kg)    BMI 37.94 kg/m   Physical Exam Constitutional:      Appearance: She is well-developed.  HENT:     Head: Normocephalic.     Right Ear: External ear normal.     Left Ear: External ear normal.  Eyes:     Pupils: Pupils are equal, round, and reactive to light.  Neck:     Thyroid: No thyromegaly.     Trachea: No tracheal deviation.  Cardiovascular:     Rate and Rhythm: Normal rate.  Pulmonary:     Effort: Pulmonary effort is normal.  Abdominal:     Palpations: Abdomen is soft.  Skin:    General: Skin is warm and dry.  Neurological:     Mental Status: She is alert and oriented to person, place, and time.  Psychiatric:        Behavior: Behavior normal.     Ortho Exam patient has back and leg pain  with flexion and resuming erect position.  Fingertips to mid tibial level.  No quad atrophy.  Right elbow shows good flexion and extension normal supination pronation.  Specialty Comments:  No specialty comments available.  Imaging: CLINICAL DATA:  Right-sided low back pain and right leg pain and numbness in both feet. Patient fell on 11/25/2018  EXAM: MRI LUMBAR SPINE WITHOUT CONTRAST  TECHNIQUE: Multiplanar, multisequence MR imaging of the lumbar spine was performed. No intravenous contrast was administered.  COMPARISON:  CT scan of the abdomen and pelvis dated 10/11/2018  FINDINGS: Segmentation:  Standard.  Alignment:  3 mm anterolisthesis of L4 on L5.  Vertebrae: No fracture, evidence of discitis, or significant bone lesion.  Conus medullaris and cauda equina: Conus extends to the L1-2 level. Conus and cauda equina appear  normal.  Paraspinal and other soft tissues: Negative.  Disc levels:  T12-L1: Normal.  L1-2: Normal.  L2-3: Normal.  L3-4: Small central disc bulge with no neural impingement. Otherwise normal.  L4-5: Moderately severe bilateral facet arthritis with 3 mm spondylolisthesis. Prominent disc bulge central and to the right with a small annular tear in the disc at the level of the neural foramen. Slight compression of the right side of the thecal sac without focal neural impingement. The right L3 nerve appears to exit without impingement. There is only minimal narrowing of the spinal canal.  L5-S1: Normal.  IMPRESSION: 1. Moderately severe facet arthritis at L4-5 bilaterally with grade 1 spondylolisthesis. 2. Small asymmetric disc bulge central and to the right at L4-5 with an annular tear in the right neural foramen. However, the right L3 nerve appears to exit without impingement. 3. Otherwise, no significant abnormalities.   Electronically Signed   By: Lorriane Shire M.D.   On: 01/24/2019 17:13   PMFS History: Patient Active Problem List   Diagnosis Date Noted   Leukocytosis 07/06/2014   Deficiency anemia 07/06/2014   History of melanoma 07/06/2014   Preventive measure 07/06/2014   Type II or unspecified type diabetes mellitus without mention of complication, uncontrolled 05/12/2013   Noncompliance with medication regimen 08/26/2012   Tobacco use disorder 11/29/2011   Type 2 diabetes mellitus without complication ([place]) 46/50/3546   Depressive disorder, not elsewhere classified 05/01/2011   Essential hypertension, benign 05/01/2011   Pure hypercholesterolemia 05/01/2011   Unspecified hypothyroidism 05/01/2011   Past Medical History:  Diagnosis Date   Colon polyps    Deficiency anemia 07/06/2014   Diabetes mellitus    Elevated LFTs Hayes   lobular hepatitis and fibrosis on biopsy   GERD (gastroesophageal reflux disease)    Heart  murmur    Hypercholesteremia    Hypertension    Hypothyroidism    Inflammation of labium 11/01/15   with benign biopsy of right labium   Leukocytosis 07/06/2014   Melanoma ([place]) 8/06 L cheek(clarks level 2) DrTafeen   Sleep apnea, obstructive severe,02/2010   not using CPAP   TIA (transient ischemic attack) 05/2007   TIA (transient ischemic attack) 2009   Vitamin D deficiency     Family History  Problem Relation Age of Onset   Stroke Mother    Arthritis Mother        rheumatoid arthritis   Hyperlipidemia Brother    Hypertension Brother    Diabetes Brother     Past Surgical History:  Procedure Laterality Date   BREAST SURGERY  benign breast tumor removed 1974   cardiolite stress  normal  11/04 (Dr. Jaci Standard)   CATARACT EXTRACTION [facility]  11/28/2011  Procedure: CATARACT EXTRACTION PHACO AND INTRAOCULAR LENS PLACEMENT (IOC);  Surgeon: Elta Guadeloupe T. Gershon Crane, MD;  Location: [facility];  Service: Ophthalmology;  Laterality: Left;  CDE 6.72   CESAREAN SECTION  x 1; 1987   COLONOSCOPY  DrHayes   DILATION AND CURETTAGE OF UTERUS  11/2007   HEMORRHOID SURGERY  external   MELANOMA EXCISION  L cheek 8/06   OTHER SURGICAL HISTORY Left 11/01/15   left labium   Social History   Occupational History   Occupation: Admissions at [employer]  Tobacco Use   Smoking status: Former Smoker    Packs/day: 0.10    Types: Cigarettes    Last attempt to quit: 11/18/2012    Years since quitting: 6.2   Smokeless tobacco: Never Used   Tobacco comment: quit smoking 2006; recently restarted due to stressors; quit again 11/2012  Substance and Sexual Activity   Alcohol use: Yes    Comment: 1 drink once a year   Drug use: No   Sexual activity: Not on file

## 2019-02-21 MED FILL — GABAPENTIN 300 MG CAPSULE: 300 | 3 days supply | Qty: 9 | Fill #0

## 2019-02-21 MED FILL — MELOXICAM 15 MG TABLET: 15 | 30 days supply | Qty: 30 | Fill #0

## 2019-02-21 MED FILL — HYDROCODON-APAP 5-325: 5-325 | 5 days supply | Qty: 10 | Fill #0

## 2019-02-24 MED FILL — GABAPENTIN 300 MG CAPSULE: 300 | 30 days supply | Qty: 120 | Fill #0

## 2019-03-04 MED FILL — MELOXICAM 15 MG TABLET: 15 | 90 days supply | Qty: 90 | Fill #0

## 2019-03-04 MED FILL — HYDROCODON-APAP 5-325: 5-325 | 5 days supply | Qty: 10 | Fill #0

## 2019-03-10 ENCOUNTER — Telehealth: Payer: Self-pay | Admitting: Orthopaedic Surgery

## 2019-03-10 DIAGNOSIS — G8929 Other chronic pain: Secondary | ICD-10-CM

## 2019-03-10 NOTE — Telephone Encounter (Signed)
Patient called advised she had her lab work done at [facility] and her A1C is 7.7. 03/04/2019 Patient asked if she can schedule to get an injection in her lower back? The number to contact patient is [phone]

## 2019-03-10 NOTE — Telephone Encounter (Signed)
Please advise 

## 2019-03-11 NOTE — Telephone Encounter (Signed)
Order entered into system.  I left voicemail for patient advising.

## 2019-03-11 NOTE — Telephone Encounter (Signed)
OK - thanks

## 2019-03-13 ENCOUNTER — Telehealth: Payer: Self-pay | Admitting: Orthopaedic Surgery

## 2019-03-13 NOTE — Telephone Encounter (Signed)
Please advise. Referral entered for [facility] yesterday.

## 2019-03-13 NOTE — Telephone Encounter (Signed)
Patient states she is getting treated for diabetes A1c now less than 8 was greater than 10 previously.  She has appointment to come back to see me in August and if her A1c is less than 7.5 on return should get flexion-extension lateral lumbar spine x-rays and then we can discuss surgical intervention.  She previously had Percocet I discussed with her she is best not to take any narcotic pain medication before surgery so that is effective for controlling her pain after the surgery since her spine fusion is painful.

## 2019-03-13 NOTE — Telephone Encounter (Signed)
Patient called asked if she can get something prescribed for her to help with the pain. Patient asked what can she take orally to control the pain. Patient said she is in so much pain. The number to contact patient is [phone]

## 2019-03-14 ENCOUNTER — Telehealth: Payer: Self-pay | Admitting: Orthopaedic Surgery

## 2019-03-14 NOTE — Telephone Encounter (Signed)
Please advise. Do you not want patient to have ESI?

## 2019-03-14 NOTE — Telephone Encounter (Signed)
Cancel ESI. thanks

## 2019-03-14 NOTE — Telephone Encounter (Signed)
Please see below.

## 2019-03-31 MED FILL — glipiZIDE ER 10 MG TB24: 10 | 90 days supply | Qty: 90 | Fill #0

## 2019-04-01 MED FILL — SYNTHROID 125 MCG TABLET: 125 | 90 days supply | Qty: 90 | Fill #0

## 2019-04-03 ENCOUNTER — Telehealth: Payer: Self-pay | Admitting: Orthopaedic Surgery

## 2019-04-03 DIAGNOSIS — G8929 Other chronic pain: Secondary | ICD-10-CM

## 2019-04-03 NOTE — Telephone Encounter (Signed)
Ok for ESI thanks 

## 2019-04-03 NOTE — Telephone Encounter (Signed)
pls advise. Ok to refer her for [facility] as previously stated in last OV note or do you want to see her back first?

## 2019-04-03 NOTE — Telephone Encounter (Signed)
Patient went to see her pcp [name]) with LBP radiating to right leg. Pt's pcp wants her to see if Dr Lorin Mercy will order the epidural steroid injection, since her A1C has came down. Pt's call back # [phone]

## 2019-04-04 NOTE — Telephone Encounter (Signed)
Order submitted for [facility] Patient advised of this.

## 2019-04-10 ENCOUNTER — Telehealth: Payer: Self-pay | Admitting: Orthopaedic Surgery

## 2019-04-10 NOTE — Telephone Encounter (Signed)
noted 

## 2019-04-10 NOTE — Telephone Encounter (Signed)
Patient called asked if she can get something called into her pharmacy for pain so that she can sleep. Patient said she is hurting in both of her legs. Patient said she is also experiencing numbness in both legs. Patient said she also need a muscle relaxer. Patient uses the [facility] outpatient pharmacy. The number to contact patient is [phone]

## 2019-04-10 NOTE — Telephone Encounter (Signed)
Please advise 

## 2019-04-10 NOTE — Telephone Encounter (Signed)
I talked with pt and her daughter ,  ESI scheduled.   Discussed. FYI.   Keeps her daughter up at night and daughter not happy with that. Discussed.

## 2019-04-22 ENCOUNTER — Encounter: Payer: Self-pay | Admitting: Physical Medicine and Rehabilitation

## 2019-04-22 ENCOUNTER — Ambulatory Visit: Payer: Self-pay

## 2019-04-22 ENCOUNTER — Ambulatory Visit (INDEPENDENT_AMBULATORY_CARE_PROVIDER_SITE_OTHER): Payer: Medicare Other | Admitting: Physical Medicine and Rehabilitation

## 2019-04-22 VITALS — BP 130/79 | HR 77

## 2019-04-22 DIAGNOSIS — M5416 Radiculopathy, lumbar region: Secondary | ICD-10-CM

## 2019-04-22 DIAGNOSIS — M4316 Spondylolisthesis, lumbar region: Secondary | ICD-10-CM | POA: Diagnosis not present

## 2019-04-22 MED ORDER — METHYLPREDNISOLONE ACETATE 80 MG/ML IJ SUSP
80.0000 mg | Freq: Once | INTRAMUSCULAR | Status: AC
  Administered 2019-04-22: 80 mg

## 2019-04-22 NOTE — Progress Notes (Signed)
Numeric Pain Rating Scale and Functional Assessment Average Pain 10   In the last MONTH (on 0-10 scale) has pain interfered with the following?  1. General activity like being  able to carry out your everyday physical activities such as walking, climbing stairs, carrying groceries, or moving a chair?  Rating(10)   +Driver, -BT, -Dye Allergies. 

## 2019-04-28 ENCOUNTER — Encounter: Payer: Medicare Other | Admitting: Physical Medicine and Rehabilitation

## 2019-04-30 ENCOUNTER — Telehealth: Payer: Self-pay | Admitting: Orthopaedic Surgery

## 2019-04-30 NOTE — Telephone Encounter (Signed)
Please advise. Patient had lumbar ESI on 04/22/2019.

## 2019-04-30 NOTE — Telephone Encounter (Signed)
Fallen 3 times , daughter to call to see if we can check her Friday afternoon in office.

## 2019-04-30 NOTE — Telephone Encounter (Signed)
Patient's daughter Ralph Leyden) called called advised patient has fallen 3 times since she had the injection. [name] said the pain is much worse and she don't know what to do. [name] said her left leg is numb and very painful. The number to contact [name] is [phone]

## 2019-05-01 ENCOUNTER — Encounter: Payer: Self-pay | Admitting: Cardiovascular Disease

## 2019-05-01 MED FILL — predniSONE 10 MG TABS: 10 | 6 days supply | Qty: 21 | Fill #0

## 2019-05-01 MED FILL — CEPHALEXIN 500 MG CAPSULE: 500 | 10 days supply | Qty: 20 | Fill #0

## 2019-05-01 NOTE — Telephone Encounter (Signed)
Please make appt for Friday afternoon if patient calls in.

## 2019-05-02 ENCOUNTER — Encounter: Payer: Self-pay | Admitting: Orthopaedic Surgery

## 2019-05-02 ENCOUNTER — Ambulatory Visit (INDEPENDENT_AMBULATORY_CARE_PROVIDER_SITE_OTHER): Payer: Medicare Other | Admitting: Orthopaedic Surgery

## 2019-05-02 VITALS — BP 133/82 | HR 75 | Ht 65.0 in | Wt 204.0 lb

## 2019-05-02 DIAGNOSIS — M545 Low back pain: Secondary | ICD-10-CM | POA: Diagnosis not present

## 2019-05-02 DIAGNOSIS — R29898 Other symptoms and signs involving the musculoskeletal system: Secondary | ICD-10-CM | POA: Diagnosis not present

## 2019-05-02 DIAGNOSIS — G8929 Other chronic pain: Secondary | ICD-10-CM | POA: Diagnosis not present

## 2019-05-02 MED ORDER — SERTRALINE HCL 50 MG PO TABS: 50.0000 mg | ORAL_TABLET | Freq: Every day | ORAL | 0 refills | Status: DC

## 2019-05-02 MED FILL — SERTRALINE HCL 50 MG TABLET: 50 | 30 days supply | Qty: 30 | Fill #0

## 2019-05-02 NOTE — Progress Notes (Addendum)
Office Visit Note   Patient: [name]           Date of Birth: Jan 30, 1951           MRN: [id number] Visit Date: 05/02/2019              Requested by: Marda Stalker, PA-C [place],  [place] [address] PCP: Marda Stalker, PA-C   Assessment & Plan: Visit Diagnoses: low back pain with legs giving way and hx of falling.   Plan: Lower extremity EMGs nerve conduction velocities.  Patient's had falling episodes even using a 4 prong cane.  Prescription sent in for Zoloft which she used to be on but it stopped.  This normally prescribed by Dr. Zadie Rhine in his office is closed so we sent in some to get her started.  Her A1c is improved now down to 7.7 and was greater than 10.  She can see Dr. Ernestina Patches with electrical test and see if he thinks she might be a candidate for possible injection.  I discussed with her that depression and anxiety are aggravating her symptoms.  She has 3 mm anterolisthesis we reviewed the MRI scan with her and went over it in detail.  Has positive Waddell signs on exam.  I sent a prescription back and so she can restart her Zoloft and gave her a month supply and she can continue this with her PCP.  We will make sure she does not have any evidence of neurologic condition with electrical tests.  Follow-Up Instructions: return after EMG, NCV  Orders:  No orders of the defined types were placed in this encounter.  Meds ordered this encounter  Medications  . sertraline (ZOLOFT) 50 MG tablet    Sig: Take 1 tablet (50 mg total) by mouth daily.    Dispense:  30 tablet    Refill:  0      Procedures: No procedures performed   Clinical Data: No additional findings.   Subjective: Chief Complaint  Patient presents with  . Lower Back - Pain    HPI 68 year old female returns and states since she has had her epidural injection she has had 3 episodes of falling.  She is called the office more than 10 times with ongoing problems with pain  requesting pain medication.  She had problems with diabetes that was poorly controlled with A1c greater than 10 and epidural was delayed until finally her A1c was improved now down to 7.7 and continuing to improve.  She states her left leg goes numb at times which is causing her to fall.  States it feels like she is walking on hot coals.  She has been crying has problems sleeping wakes up for alarm goes off.  Past history of depression but she is not been on her medications.  Previously on Zoloft.  Review of Systems possible history of noncompliance with medication regimens.  Positive hypercholesterolemia depression disorder, type 2 diabetes now improved A1c.  Hypothyroidism.  Lumbar MRI May 2020 showed moderately severe facet arthritis at L4-5 with grade 1 spondylolisthesis without compression.   Objective: Vital Signs: BP 133/82   Pulse 75   Ht 5\' 5"  (1.651 m)   Wt 204 lb (92.5 kg)   BMI 33.95 kg/m   Physical Exam Constitutional:      Appearance: She is well-developed.  HENT:     Head: Normocephalic.     Right Ear: External ear normal.     Left Ear: External ear normal.  Eyes:     Pupils: Pupils are equal, round, and reactive to light.  Neck:     Thyroid: No thyromegaly.     Trachea: No tracheal deviation.  Cardiovascular:     Rate and Rhythm: Normal rate.  Pulmonary:     Effort: Pulmonary effort is normal.  Abdominal:     Palpations: Abdomen is soft.  Skin:    General: Skin is warm and dry.  Neurological:     Mental Status: She is alert and oriented to person, place, and time.  Psychiatric:        Behavior: Behavior normal.     Ortho Exam anterior tib gastrocsoleus is strong with flat encouragement.  Initially multiple attempts at anterior tib testing had been performed to the counter pulling and inconsistent exam with positive Waddell sign.  Negative logroll to the hips negative popliteal compression test.  No atrophy lower extremities distal pulses are palpable.  Some  sciatic notch tenderness both right and left.  Specialty Comments:  No specialty comments available.  Imaging: No results found.   PMFS History: Patient Active Problem List   Diagnosis Date Noted  . Leukocytosis 07/06/2014  . Deficiency anemia 07/06/2014  . History of melanoma 07/06/2014  . Preventive measure 07/06/2014  . Type II or unspecified type diabetes mellitus without mention of complication, uncontrolled 05/12/2013  . Noncompliance with medication regimen 08/26/2012  . Tobacco use disorder 11/29/2011  . Type 2 diabetes mellitus without complication ([facility]) 44/62/8638  . Depressive disorder, not elsewhere classified 05/01/2011  . Essential hypertension, benign 05/01/2011  . Pure hypercholesterolemia 05/01/2011  . Unspecified hypothyroidism 05/01/2011   Past Medical History:  Diagnosis Date  . Colon polyps   . Deficiency anemia 07/06/2014  . Diabetes mellitus   . Elevated LFTs Hayes   lobular hepatitis and fibrosis on biopsy  . GERD (gastroesophageal reflux disease)   . Heart murmur   . Hypercholesteremia   . Hypertension   . Hypothyroidism   . Inflammation of labium 11/01/15   with benign biopsy of right labium  . Leukocytosis 07/06/2014  . Melanoma ([facility]) 8/06 L cheek(clarks level 2) DrTafeen  . Sleep apnea, obstructive severe,02/2010   not using CPAP  . TIA (transient ischemic attack) 05/2007  . TIA (transient ischemic attack) 2009  . Vitamin D deficiency     Family History  Problem Relation Age of Onset  . Stroke Mother   . Arthritis Mother        rheumatoid arthritis  . Hyperlipidemia Brother   . Hypertension Brother   . Diabetes Brother     Past Surgical History:  Procedure Laterality Date  . BREAST SURGERY  benign breast tumor removed 1974  . cardiolite stress  normal  11/04 (Dr. Jaci Standard)  . CATARACT EXTRACTION W/PHACO  11/28/2011   Procedure: CATARACT EXTRACTION PHACO AND INTRAOCULAR LENS PLACEMENT (IOC);  Surgeon: Elta Guadeloupe T. Gershon Crane, MD;  Location:  [facility];  Service: Ophthalmology;  Laterality: Left;  CDE 6.72  . CESAREAN SECTION  x 1; 1987  . COLONOSCOPY  DrHayes  . DILATION AND CURETTAGE OF UTERUS  11/2007  . HEMORRHOID SURGERY  external  . MELANOMA EXCISION  L cheek 8/06  . OTHER SURGICAL HISTORY Left 11/01/15   left labium   Social History   Occupational History  . 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 Smoking status: Former Smoker    Packs/day: 0.10    Types: Cigarettes    Quit date:  11/18/2012    Years since quitting: 6.4  . Smokeless tobacco: Never Used  . Tobacco comment: quit smoking 2006; recently restarted due to stressors; quit again 11/2012  Substance and Sexual Activity  . Alcohol use: Yes    Comment: 1 drink once a year  . Drug use: No  . Sexual activity: Not on file

## 2019-05-04 ENCOUNTER — Other Ambulatory Visit: Payer: Self-pay

## 2019-05-04 ENCOUNTER — Encounter (HOSPITAL_COMMUNITY): Payer: Self-pay

## 2019-05-04 ENCOUNTER — Emergency Department (HOSPITAL_COMMUNITY)
Admission: EM | Admit: 2019-05-04 | Discharge: 2019-05-04 | Disposition: A | Discharge status: Home / Self Care | Payer: Medicare Other | Attending: Emergency Medicine | Admitting: Emergency Medicine

## 2019-05-04 DIAGNOSIS — I10 Essential (primary) hypertension: Secondary | ICD-10-CM | POA: Diagnosis not present

## 2019-05-04 DIAGNOSIS — Z7984 Long term (current) use of oral hypoglycemic drugs: Secondary | ICD-10-CM | POA: Diagnosis not present

## 2019-05-04 DIAGNOSIS — E039 Hypothyroidism, unspecified: Secondary | ICD-10-CM | POA: Insufficient documentation

## 2019-05-04 DIAGNOSIS — Z8673 Personal history of transient ischemic attack (TIA), and cerebral infarction without residual deficits: Secondary | ICD-10-CM | POA: Diagnosis not present

## 2019-05-04 DIAGNOSIS — E119 Type 2 diabetes mellitus without complications: Secondary | ICD-10-CM | POA: Diagnosis not present

## 2019-05-04 DIAGNOSIS — M545 Low back pain: Secondary | ICD-10-CM | POA: Diagnosis present

## 2019-05-04 DIAGNOSIS — Z79899 Other long term (current) drug therapy: Secondary | ICD-10-CM | POA: Diagnosis not present

## 2019-05-04 DIAGNOSIS — M5416 Radiculopathy, lumbar region: Secondary | ICD-10-CM

## 2019-05-04 DIAGNOSIS — Z7982 Long term (current) use of aspirin: Secondary | ICD-10-CM | POA: Insufficient documentation

## 2019-05-04 DIAGNOSIS — G8929 Other chronic pain: Secondary | ICD-10-CM | POA: Diagnosis not present

## 2019-05-04 DIAGNOSIS — Z87891 Personal history of nicotine dependence: Secondary | ICD-10-CM | POA: Diagnosis not present

## 2019-05-04 MED ORDER — METHOCARBAMOL 1000 MG/10ML IJ SOLN
500.0000 mg | Freq: Once | INTRAMUSCULAR | Status: AC
  Administered 2019-05-04: 500 mg via INTRAMUSCULAR
  Filled 2019-05-04: qty 5

## 2019-05-04 MED ORDER — OXYCODONE-ACETAMINOPHEN 5-325 MG PO TABS: 1.0000 | ORAL_TABLET | Freq: Four times a day (QID) | ORAL | 0 refills | Status: DC | PRN

## 2019-05-04 MED ORDER — OXYCODONE-ACETAMINOPHEN 5-325 MG PO TABS
1.0000 | ORAL_TABLET | Freq: Once | ORAL | Status: AC
  Administered 2019-05-04: 1 via ORAL
  Filled 2019-05-04: qty 1

## 2019-05-04 MED ORDER — CYCLOBENZAPRINE HCL 10 MG PO TABS: 10.0000 mg | ORAL_TABLET | Freq: Two times a day (BID) | ORAL | 0 refills | Status: DC | PRN

## 2019-05-04 NOTE — Discharge Instructions (Addendum)
Please read instructions below.  You can take 600 mg of ibuprofen every 6 hours as needed for pain.   You can take oxycodone every 6 hours as needed for severe pain.  Be aware this medication can make you drowsy.  There is Tylenol in this medication. Apply ice to your back for 20 minutes at a time.  You can also apply heat if this provides more relief.   You can take Flexeril/cyclobenzaprine every 12 hours as needed for muscle spasm.  Be aware this medication can make you drowsy; do not take while driving or drinking alcohol.   Schedule an appointment with the neurosurgeon for further management of your back pain. Return to ER if new numbness under your bottom inability to urinate, inability to hold your bowels, or new weakness in your extremities.

## 2019-05-04 NOTE — ED Notes (Signed)
Pt added a c/o of chest pain while going over D/C paperwork.  Provider informed.

## 2019-05-04 NOTE — ED Provider Notes (Signed)
[facility] DEPT Provider Note   CSN: [id number] Arrival date & time: 05/04/19  1139    History   Chief Complaint Chief Complaint  Patient presents with  . Back Pain  . Leg Pain    HPI [name] is a 68 y.o. female with past medical history of type 2 diabetes, TIA, chronic low back pain with radiculopathy, presenting to the emergency department with gradually worsening low back pain with radiating pain down the left leg.  She had a mechanical fall in March 2020 and has had pain in her low back since that time.  Pain is worse with movement and walking.  She is followed by Dr. Lorin Mercy with orthopedics, and recently had an ESI injection 2 weeks ago.  She states her symptoms have not gotten any better after the injection, and gradually worsening.  Her pain is worse radiating down her left leg and generalized.  She also has some generalized decreased sensation to her left leg.  She had an MRI of her L-spine done in May of this year which revealed L4-L5 disc bulge as well as moderately severe facet arthritis at L4-L5 bilaterally.  She states initially she had radicular pain down the right leg, however that resolved and is now down the left leg.  She has had no reinjury or new falls.  She was prescribed Percocet and hydrocodone over the last few months for her symptoms, however she only been taking it intermittently and has a few tabs left.  The oxycodone provides significant relief of her symptoms.  She states she has been waiting for a neurosurgery referral by her orthopedic specialist, however they have not called in the referral yet.  She denies associated fever, history of cancer, history of IV drug use, saddle paresthesia, urinary or bowel incontinence, weakness in her extremities.     The history is provided by the patient and medical records.    Past Medical History:  Diagnosis Date  . Colon polyps   . Deficiency anemia 07/06/2014  . Diabetes mellitus    . Elevated LFTs [place]   lobular hepatitis and fibrosis on biopsy  . GERD (gastroesophageal reflux disease)   . Heart murmur   . Hypercholesteremia   . Hypertension   . Hypothyroidism   . Inflammation of labium 11/01/15   with benign biopsy of right labium  . Leukocytosis 07/06/2014  . Melanoma ([facility]) 8/06 L cheek(clarks level 2) DrTafeen  . Sleep apnea, obstructive severe,02/2010   not using CPAP  . TIA (transient ischemic attack) 05/2007  . TIA (transient ischemic attack) 2009  . Vitamin D deficiency     Patient Active Problem List   Diagnosis Date Noted  . Leukocytosis 07/06/2014  . Deficiency anemia 07/06/2014  . History of melanoma 07/06/2014  . Preventive measure 07/06/2014  . Type II or unspecified type diabetes mellitus without mention of complication, uncontrolled 05/12/2013  . Noncompliance with medication regimen 08/26/2012  . Tobacco use disorder 11/29/2011  . Type 2 diabetes mellitus without complication ([facility]) 09/32/3557  . Depressive disorder, not elsewhere classified 05/01/2011  . Essential hypertension, benign 05/01/2011  . Pure hypercholesterolemia 05/01/2011  . Unspecified hypothyroidism 05/01/2011    Past Surgical History:  Procedure Laterality Date  . BREAST SURGERY  benign breast tumor removed 1974  . cardiolite stress  normal  11/04 (Dr. Jaci Standard)  . CATARACT EXTRACTION W/PHACO  11/28/2011   Procedure: CATARACT EXTRACTION PHACO AND INTRAOCULAR LENS PLACEMENT (IOC);  Surgeon: Elta Guadeloupe T. Gershon Crane, MD;  Location:  [facility];  Service: Ophthalmology;  Laterality: Left;  CDE 6.72  . CESAREAN SECTION  x 1; 1987  . COLONOSCOPY  DrHayes  . DILATION AND CURETTAGE OF UTERUS  11/2007  . HEMORRHOID SURGERY  external  . MELANOMA EXCISION  L cheek 8/06  . OTHER SURGICAL HISTORY Left 11/01/15   left labium     OB History   No obstetric history on file.      Home Medications    Prior to Admission medications   Medication Sig Start Date End Date Taking? Authorizing  Provider  aspirin EC 81 MG tablet Take 81 mg by mouth daily.    [provider]  atorvastatin (LIPITOR) 20 MG tablet Take 40 mg by mouth daily.     [provider]  baclofen (LIORESAL) 10 MG tablet Take 1 tablet (10 mg total) by mouth every 8 (eight) hours as needed for muscle spasms. 12/28/17   Withrow, Elyse Jarvis, FNP  cephALEXin (KEFLEX) 500 MG capsule Take 1 capsule (500 mg total) by mouth 2 (two) times daily. 10/09/18   Larene Pickett, PA-C  cyclobenzaprine (FLEXERIL) 10 MG tablet Take 1 tablet (10 mg total) by mouth 2 (two) times daily as needed for muscle spasms. 05/04/19   Cloee Dunwoody, [place] N, PA-C  dicyclomine (BENTYL) 20 MG tablet Take 1 tablet (20 mg total) by mouth 4 (four) times daily -  before meals and at bedtime. Patient not taking: Reported on 10/08/2018 06/03/17 06/08/17  Duffy Bruce, MD  etodolac (LODINE) 300 MG capsule Take 1 capsule (300 mg total) by mouth 2 (two) times daily. 12/28/17   Withrow, Elyse Jarvis, FNP  fluconazole (DIFLUCAN) 150 MG tablet Take 1 tablet by mouth now, Repeat in 72 hours if still having symptoms 10/09/18   Larene Pickett, PA-C  gabapentin (NEURONTIN) 300 MG capsule Take 300 mg by mouth daily as needed (neuropathic pain).    [provider]  glipiZIDE (GLUCOTROL XL) 10 MG 24 hr tablet Take 10 mg by mouth daily with breakfast.    [provider]  ibuprofen (ADVIL,MOTRIN) 200 MG tablet Take 800 mg by mouth every 8 (eight) hours as needed for headache or mild pain. Reported on 02/22/2016    [provider]  metFORMIN (GLUCOPHAGE-XR) 500 MG 24 hr tablet Take 1,000 mg by mouth 2 (two) times daily.    [provider]  Multiple Vitamins-Minerals (EMERGEN-C IMMUNE PLUS PO) Take 1 Package by mouth daily as needed (immune support).    [provider]  naproxen sodium (ALEVE) 220 MG tablet Take 660 mg by mouth daily as needed (pain).    [provider]  omeprazole (PRILOSEC) 20 MG capsule Take 1 capsule (20  mg total) by mouth daily. Take 30 minutes before eating, first thing in the morning Patient not taking: Reported on 10/08/2018 06/03/17 06/17/17  Duffy Bruce, MD  ondansetron (ZOFRAN) 8 MG tablet Take 1 tablet (8 mg total) by mouth every 8 (eight) hours as needed for nausea or vomiting. 02/23/16   Molpus, John, MD  oxyCODONE-acetaminophen (PERCOCET/ROXICET) 5-325 MG tablet Take 1 tablet by mouth every 6 (six) hours as needed for severe pain. 05/04/19   Kechia Yahnke, [place] N, PA-C  polyethylene glycol (MIRALAX / GLYCOLAX) packet Take 17 g by mouth 2 (two) times daily. 10/11/18   Jola Schmidt, MD  sertraline (ZOLOFT) 50 MG tablet Take 50 mg by mouth daily.    [provider]  sertraline (ZOLOFT) 50 MG tablet Take 1 tablet (50 mg total) by  mouth daily. 05/02/19   Marybelle Killings, MD  SYNTHROID 125 MCG tablet Take 1 tablet (125 mcg total) by mouth daily. 03/31/13   Rita Ohara, MD  telmisartan-hydrochlorothiazide (MICARDIS HCT) 80-25 MG tablet Take 1 tablet by mouth at bedtime.     [provider]    Family History Family History  Problem Relation Age of Onset  . Stroke Mother   . Arthritis Mother        rheumatoid arthritis  . Hyperlipidemia Brother   . Hypertension Brother   . Diabetes Brother     Social History Social History   Tobacco Use  . Smoking status: Former Smoker    Packs/day: 0.10    Types: Cigarettes    Quit date: 11/18/2012    Years since quitting: 6.4  . Smokeless tobacco: Never Used  . Tobacco comment: quit smoking 2006; recently restarted due to stressors; quit again 11/2012  Substance Use Topics  . Alcohol use: Yes    Comment: 1 drink once a year  . Drug use: No     Allergies   Ace inhibitors, Acetaminophen, and Metformin and related   Review of Systems Review of Systems  Constitutional: Negative for fever.  Gastrointestinal: Negative for abdominal pain.       No bowel incontinence  Genitourinary: Negative for difficulty urinating.   Musculoskeletal: Positive for back pain and myalgias.  Neurological: Positive for numbness. Negative for weakness.  All other systems reviewed and are negative.    Physical Exam Updated Vital Signs BP (!) 164/85   Pulse 77   Temp 97.6 F (36.4 C) (Oral)   Resp 16   Ht 5\' 5"  (1.651 m)   Wt 92.5 kg   SpO2 95%   BMI 33.95 kg/m   Physical Exam Vitals signs and nursing note reviewed.  Constitutional:      Appearance: She is well-developed.     Comments: Pt appears uncomfortable.  HENT:     Head: Normocephalic and atraumatic.  Eyes:     Conjunctiva/sclera: Conjunctivae normal.  Cardiovascular:     Rate and Rhythm: Normal rate and regular rhythm.  Pulmonary:     Effort: Pulmonary effort is normal. No respiratory distress.     Breath sounds: Normal breath sounds.  Abdominal:     General: Bowel sounds are normal.     Palpations: Abdomen is soft.     Tenderness: There is no abdominal tenderness. There is no guarding or rebound.  Musculoskeletal:     Comments: Generalized TTP to lower back and left lower extremity. No obvious deformity is present. Positive straight leg raise b/l.  Skin:    General: Skin is warm.  Neurological:     Mental Status: She is alert.     Comments: Normal tone.  5/5 strength in BUE and BLE including strong and equal grip strength and dorsiflexion/plantar flexion Sensory: pt reports slightly dec sensation to LLE not following any particular dermatome.  CV: distal pulses palpable throughout    Psychiatric:        Behavior: Behavior normal.      ED Treatments / Results  Labs (all labs ordered are listed, but only abnormal results are displayed) Labs Reviewed - No data to display  EKG None  Radiology No results found.  Procedures Procedures (including critical care time)  Medications Ordered in ED Medications  oxyCODONE-acetaminophen (PERCOCET/ROXICET) 5-325 MG per tablet 1 tablet (1 tablet Oral Given 05/04/19 1254)  methocarbamol  (ROBAXIN) injection 500 mg (500 mg Intramuscular Given 05/04/19  1338)     Initial Impression / Assessment and Plan / ED Course  I have reviewed the triage vital signs and the nursing notes.  Pertinent labs & imaging results that were available during my care of the patient were reviewed by me and considered in my medical decision making (see chart for details).        Pt presenting with chronic low back pain and left sided radiculopathy.  Outpatient MRI showing bulging disc at L4-L5 and severe facet arthritis. She is followed by Dr. Lorin Mercy with ortho, planning for outpt neurosurgery referral.  Patient has no red flags to be concerning for cauda equina, no saddle paresthesia, weakness, bowel or bladder incontinence.  No fevers, history of cancer or IV drug use.  Reports her symptoms are significantly improved at home with Percocet, however she has been taking a prescription from March intermittently for her symptoms.  She is having trouble managing her symptoms at home with over-the-counter medications and topical creams and patches.  Exam today is reassuring, she has normal strength.  Reports slightly decreased sensation to left leg in a generalized fashion.  Patient discussed with Dr. Tomi Bamberger.  At this time do not believe repeat imaging is indicated.  Her symptoms were treated with p.o. Percocet and IM Robaxin and she reports significant relief in her symptoms.  Will prescribe a short course of Percocet and muscle relaxant for symptom management and neurosurgery referral for further management of her chronic back pain with radiculopathy.  She is instructed of strict return precautions, verbalized understanding agrees with care plan for discharge.  Discussed results, findings, treatment and follow up. Patient advised of return precautions. Patient verbalized understanding and agreed with plan.  [place] Controlled Substance reporting System queried   Final Clinical Impressions(s) / ED Diagnoses    Final diagnoses:  Chronic lumbar radiculopathy    ED Discharge Orders         Ordered    oxyCODONE-acetaminophen (PERCOCET/ROXICET) 5-325 MG tablet  Every 6 hours PRN     05/04/19 1430    cyclobenzaprine (FLEXERIL) 10 MG tablet  2 times daily PRN     05/04/19 1430           Argenis Kumari, [place] N, [place] 05/04/19 1458    Dorie Rank, MD 05/06/19 1204

## 2019-05-04 NOTE — ED Triage Notes (Signed)
Pt dx with sciatica 1.5 weeks ago.  Pt given steroid injection last week.  Pt c/o of shooting back and L leg pain.  Pt states her leg leg will feel hot/cold.  Pt's daughter states pt's primary doctor is concerned for "nerve problems."  Pt has had multiple falls this past week.

## 2019-05-05 ENCOUNTER — Other Ambulatory Visit: Payer: Self-pay

## 2019-05-05 DIAGNOSIS — R29898 Other symptoms and signs involving the musculoskeletal system: Secondary | ICD-10-CM

## 2019-05-05 MED FILL — CYCLOBENZAPRINE 10 MG TAB: 10 | 10 days supply | Qty: 20 | Fill #0

## 2019-05-05 MED FILL — OXYCODONE-ACETAMINOPHEN 5-3: 5-325 | 2 days supply | Qty: 10 | Fill #0

## 2019-05-06 ENCOUNTER — Other Ambulatory Visit: Payer: Self-pay

## 2019-05-06 ENCOUNTER — Ambulatory Visit (INDEPENDENT_AMBULATORY_CARE_PROVIDER_SITE_OTHER): Payer: Medicare Other | Admitting: Neurology

## 2019-05-06 DIAGNOSIS — M79604 Pain in right leg: Secondary | ICD-10-CM

## 2019-05-06 DIAGNOSIS — R29898 Other symptoms and signs involving the musculoskeletal system: Secondary | ICD-10-CM

## 2019-05-06 DIAGNOSIS — M79605 Pain in left leg: Secondary | ICD-10-CM

## 2019-05-06 NOTE — Procedures (Signed)
[facility]  [address], [place]  [place], [place] [address] Tel: [phone] Fax:  [phone] Test Date:  05/06/2019  Patient: [name] DOB: [birth year] Physician: Narda Amber, DO  Sex: Female Height: 5\' 5"  Ref Phys: Rodell Perna, MD  ID#: [id number] Temp: 32.0C Technician:    Patient Complaints: This is a 68 year old female referred for evaluation of bilateral leg pain and weakness  NCV & EMG Findings: Extensive electrodiagnostic testing of the right lower extremity and additional studies of the left shows:  1. Bilateral sural and superficial peroneal sensory responses showed borderline-normal amplitudes. 2. Left peroneal motor response shows reduced amplitude at the extensor digitorum brevis (2.0 mV), and is normal at the tibialis anterior.  Bilateral tibial and right peroneal motor responses are within normal limits. 3. Bilateral tibial H reflex studies are within normal limits. 4. There is a generalized pattern of incomplete motor unit activation as seen by a variable firing pattern most likely due to pain or poor effort.  There is no evidence of active or chronic motor axon loss changes affecting any of the tested muscles.   Impression: No evidence of a lumbosacral radiculopathy of peripheral neuropathy.    Of note, results are somewhat hampered by a generalized pattern of incomplete activation due to pain or poor effort.   ___________________________ Narda Amber, DO    Nerve Conduction Studies Anti Sensory Summary Table   Site NR Peak (ms) Norm Peak (ms) P-T Amp (V) Norm P-T Amp  Left Sup Peroneal Anti Sensory (Ant Lat Mall)  32C  12 cm    2.7 <4.6 4.8 >3  Right Sup Peroneal Anti Sensory (Ant Lat Mall)  32C  12 cm    2.3 <4.6 3.9 >3  Left Sural Anti Sensory (Lat Mall)  32C  Calf    2.9 <4.6 3.3 >3  Right Sural Anti Sensory (Lat Mall)  32C  Calf    2.3 <4.6 3.0 >3   Motor Summary Table   Site NR Onset (ms) Norm Onset (ms) O-P Amp (mV)  Norm O-P Amp Site1 Site2 Delta-0 (ms) Dist (cm) Vel (m/s) Norm Vel (m/s)  Left Peroneal Motor (Ext Dig Brev)  32C  Ankle    5.3 <6.0 2.0 >2.5 B Fib Ankle 7.2 36.0 50 >40  B Fib    12.5  1.7  Poplt B Fib 1.8 8.0 44 >40  Poplt    14.3  1.2         Right Peroneal Motor (Ext Dig Brev)  32C  Ankle    4.5 <6.0 2.5 >2.5 B Fib Ankle 7.9 38.0 48 >40  B Fib    12.4  2.0  Poplt B Fib 1.5 8.0 53 >40  Poplt    13.9  2.0         Left Peroneal TA Motor (Tib Ant)  32C  Fib Head    2.9 <4.5 4.2 >3 Poplit Fib Head 1.6 8.0 50 >40  Poplit    4.5  4.2         Left Tibial Motor (Abd Hall Brev)  32C  Ankle    6.0 <6.0 14.9 >4 Knee Ankle 8.8 41.0 47 >40  Knee    14.8  12.6         Right Tibial Motor (Abd Hall Brev)  32C  Ankle    3.1 <6.0 11.1 >4 Knee Ankle 9.2 41.0 45 >40  Knee    12.3  9.3  H Reflex Studies   NR H-Lat (ms) Lat Norm (ms) L-R H-Lat (ms)  Left Tibial (Gastroc)  32C     34.42 <35 0.00  Right Tibial (Gastroc)  32C     34.42 <35 0.00   EMG   Side Muscle Ins Act Fibs Psw Fasc Number Recrt Dur Dur. Amp Amp. Poly Poly. Comment  Right AntTibialis Nml Nml Nml Nml 1- Mod-V Nml Nml Nml Nml Nml Nml N/A  Right Gastroc Nml Nml Nml Nml 1- Mod-V Nml Nml Nml Nml Nml Nml N/A  Right RectFemoris Nml Nml Nml Nml 1- Mod-V Nml Nml Nml Nml Nml Nml N/A  Right GluteusMed Nml Nml Nml Nml 1- Mod-V Nml Nml Nml Nml Nml Nml N/A  Right Flex Dig Long Nml Nml Nml Nml 1- Mod-V Nml Nml Nml Nml Nml Nml N/A  Left AntTibialis Nml Nml Nml Nml 2- Mod-V Nml Nml Nml Nml Nml Nml N/A  Left Gastroc Nml Nml Nml Nml 2- Mod-V Nml Nml Nml Nml Nml Nml N/A  Left Flex Dig Long Nml Nml Nml Nml 2- Mod-V Nml Nml Nml Nml Nml Nml N/A  Left RectFemoris Nml Nml Nml Nml 2- Mod-V Nml Nml Nml Nml Nml Nml N/A  Left GluteusMed Nml Nml Nml Nml 2- Mod-V Nml Nml Nml Nml Nml Nml N/A      Waveforms:

## 2019-05-07 ENCOUNTER — Ambulatory Visit: Payer: Medicare Other | Admitting: Orthopaedic Surgery

## 2019-05-14 NOTE — Procedures (Signed)
Lumbar Epidural Steroid Injection - Interlaminar Approach with Fluoroscopic Guidance  Patient: [name]      Date of Birth: May 19, 1951 MRN: [id number] PCP: Aretta Nip, MD      Visit Date: 04/22/2019   Universal Protocol:     Consent Given By: the patient  Position: PRONE  Additional Comments: Vital signs were monitored before and after the procedure. Patient was prepped and draped in the usual sterile fashion. The correct patient, procedure, and site was verified.   Injection Procedure Details:  Procedure Site One Meds Administered:  Meds ordered this encounter  Medications  . methylPREDNISolone acetate (DEPO-MEDROL) injection 80 mg     Laterality: Left of midline  Location/Site:  L4-L5  Needle size: 20 G  Needle type: Tuohy  Needle Placement: Paramedian epidural  Findings:   -Comments: Excellent flow of contrast into the epidural space.  Procedure Details: Using a paramedian approach from the side mentioned above, the region overlying the inferior lamina was localized under fluoroscopic visualization and the soft tissues overlying this structure were infiltrated with 4 ml. of 1% Lidocaine without Epinephrine. The Tuohy needle was inserted into the epidural space using a paramedian approach.   The epidural space was localized using loss of resistance along with lateral and bi-planar fluoroscopic views.  After negative aspirate for air, blood, and CSF, a 2 ml. volume of Isovue-250 was injected into the epidural space and the flow of contrast was observed. Radiographs were obtained for documentation purposes.    The injectate was administered into the level noted above.   Additional Comments:  No complications occurred Dressing: 2 x 2 sterile gauze and Band-Aid    Post-procedure details: Patient was observed during the procedure. Post-procedure instructions were reviewed.  Patient left the clinic in stable condition.

## 2019-05-14 NOTE — Progress Notes (Signed)
[name] - 68 y.o. female MRN [id number]  Date of birth: 1950/10/11  Office Visit Note: Visit Date: 04/22/2019 PCP: Aretta Nip, MD Referred by: Marda Stalker, PA-C  Subjective: Chief Complaint  Patient presents with  . Lower Back - Pain  . Right Leg - Pain  . Left Leg - Pain   HPI:  [name] is a 68 y.o. female who comes in today At the request of Dr. Rodell Perna for lumbar interlaminar epidural steroid injection diagnostically and therapeutically particular at the L4-5 level.  Patient has history of a fall in March 2020.  She has been followed by Dr. Rodell Perna.  She was reporting more right-sided symptoms with him.  He wanted to perform epidural injection but wanted to wait for her blood sugar values to be more normalized.  Her course is complicated by diabetes.  MRI of the lumbar spine was performed and this is reviewed below.  She does have pretty significant facet arthritis with small listhesis at L4-5 with right-sided narrowing and protrusion.  MRI reports L3 nerve root being okay in the foramen but I think that is a mistake they should have said the L4 nerve root.  Her pain today is actually lower back and both legs.  We will complete general epidural injection at L4-5 midline.  Depending on relief would suggest facet joint blocks.  There can be referred pain from facet joints in a more radicular fashion such as a facet joint syndrome.  ROS Otherwise per HPI.  Assessment & Plan: Visit Diagnoses:  1. Lumbar radiculopathy   2. Spondylolisthesis of lumbar region     Plan: No additional findings.   Meds & Orders:  Meds ordered this encounter  Medications  . methylPREDNISolone acetate (DEPO-MEDROL) injection 80 mg    Orders Placed This Encounter  Procedures  . XR C-ARM NO REPORT  . Epidural Steroid injection    Follow-up: Return for Rodell Perna, M.D..   Procedures: No procedures performed  Lumbar Epidural Steroid Injection - Interlaminar Approach  with Fluoroscopic Guidance  Patient: [name]      Date of Birth: 1950/09/24 MRN: [id number] PCP: Aretta Nip, MD      Visit Date: 04/22/2019   Universal Protocol:     Consent Given By: the patient  Position: PRONE  Additional Comments: Vital signs were monitored before and after the procedure. Patient was prepped and draped in the usual sterile fashion. The correct patient, procedure, and site was verified.   Injection Procedure Details:  Procedure Site One Meds Administered:  Meds ordered this encounter  Medications  . methylPREDNISolone acetate (DEPO-MEDROL) injection 80 mg     Laterality: Left of midline  Location/Site:  L4-L5  Needle size: 20 G  Needle type: Tuohy  Needle Placement: Paramedian epidural  Findings:   -Comments: Excellent flow of contrast into the epidural space.  Procedure Details: Using a paramedian approach from the side mentioned above, the region overlying the inferior lamina was localized under fluoroscopic visualization and the soft tissues overlying this structure were infiltrated with 4 ml. of 1% Lidocaine without Epinephrine. The Tuohy needle was inserted into the epidural space using a paramedian approach.   The epidural space was localized using loss of resistance along with lateral and bi-planar fluoroscopic views.  After negative aspirate for air, blood, and CSF, a 2 ml. volume of Isovue-250 was injected into the epidural space and the flow of contrast was observed. Radiographs were obtained for documentation purposes.  The injectate was administered into the level noted above.   Additional Comments:  No complications occurred Dressing: 2 x 2 sterile gauze and Band-Aid    Post-procedure details: Patient was observed during the procedure. Post-procedure instructions were reviewed.  Patient left the clinic in stable condition.   Clinical History: MRI LUMBAR SPINE WITHOUT CONTRAST  TECHNIQUE: Multiplanar,  multisequence MR imaging of the lumbar spine was performed. No intravenous contrast was administered.  COMPARISON: CT scan of the abdomen and pelvis dated 10/11/2018  FINDINGS: Segmentation: Standard.  Alignment: 3 mm anterolisthesis of L4 on L5.  Vertebrae: No fracture, evidence of discitis, or significant bone lesion.  Conus medullaris and cauda equina: Conus extends to the L1-2 level. Conus and cauda equina appear normal.  Paraspinal and other soft tissues: Negative.  Disc levels:  T12-L1: Normal.  L1-2: Normal.  L2-3: Normal.  L3-4: Small central disc bulge with no neural impingement. Otherwise normal.  L4-5: Moderately severe bilateral facet arthritis with 3 mm spondylolisthesis. Prominent disc bulge central and to the right with a small annular tear in the disc at the level of the neural foramen. Slight compression of the right side of the thecal sac without focal neural impingement. The right L3 nerve appears to exit without impingement. There is only minimal narrowing of the spinal canal.  L5-S1: Normal.  IMPRESSION: 1. Moderately severe facet arthritis at L4-5 bilaterally with grade 1 spondylolisthesis. 2. Small asymmetric disc bulge central and to the right at L4-5 with an annular tear in the right neural foramen. However, the right L3 nerve appears to exit without impingement. 3. Otherwise, no significant abnormalities.   Electronically Signed By: Lorriane Shire M.D. On: 01/24/2019 17:13     Objective:  VS:  HT:    WT:   BMI:     BP:130/79  HR:77bpm  TEMP: ( )  RESP:  Physical Exam  Ortho Exam Imaging: No results found.

## 2019-05-19 MED FILL — CYCLOBENZAPRINE 10 MG TAB: 10 | 20 days supply | Qty: 60 | Fill #0

## 2019-05-22 MED FILL — metFORMIN HCL ER 500 MG TB2: 500 | 90 days supply | Qty: 360 | Fill #1

## 2019-05-27 ENCOUNTER — Ambulatory Visit: Payer: Medicare Other | Admitting: Orthopaedic Surgery

## 2019-05-28 ENCOUNTER — Encounter: Payer: Self-pay | Admitting: Neurology

## 2019-05-28 ENCOUNTER — Telehealth: Payer: Self-pay | Admitting: Orthopaedic Surgery

## 2019-05-28 DIAGNOSIS — R29898 Other symptoms and signs involving the musculoskeletal system: Secondary | ICD-10-CM

## 2019-05-28 NOTE — Telephone Encounter (Signed)
She missed appt yesterday , I had called her about EMG/NCV result. She states leg still in pain and she has continued to fall. Set up PT for LE strengthening and fall provention and referral to Neurology,   Leg weakness , falling  with essentially normal EMG/NCV done by Dr. Posey Pronto and lumbar MRI without compression.   thanks

## 2019-05-28 NOTE — Telephone Encounter (Signed)
Patient called. Would like to remind Dr.Yates to do a referral for PT and Neurology. Her call back number is [phone]

## 2019-05-28 NOTE — Telephone Encounter (Signed)
Referrals entered.

## 2019-05-28 NOTE — Telephone Encounter (Signed)
Please advise 

## 2019-05-28 NOTE — Addendum Note (Signed)
Addended by: Meyer Cory on: 05/28/2019 12:07 PM   Modules accepted: Orders

## 2019-06-03 ENCOUNTER — Encounter: Payer: Self-pay | Admitting: Physical Therapy

## 2019-06-03 ENCOUNTER — Ambulatory Visit: Payer: Medicare Other | Attending: Orthopaedic Surgery | Admitting: Physical Therapy

## 2019-06-03 ENCOUNTER — Other Ambulatory Visit: Payer: Self-pay

## 2019-06-03 DIAGNOSIS — M6281 Muscle weakness (generalized): Secondary | ICD-10-CM | POA: Diagnosis present

## 2019-06-03 DIAGNOSIS — R296 Repeated falls: Secondary | ICD-10-CM | POA: Insufficient documentation

## 2019-06-03 DIAGNOSIS — G8929 Other chronic pain: Secondary | ICD-10-CM | POA: Insufficient documentation

## 2019-06-03 DIAGNOSIS — M5442 Lumbago with sciatica, left side: Secondary | ICD-10-CM | POA: Insufficient documentation

## 2019-06-03 DIAGNOSIS — R2689 Other abnormalities of gait and mobility: Secondary | ICD-10-CM

## 2019-06-04 MED FILL — SERTRALINE HCL 100 MG TAB: 100 | 90 days supply | Qty: 90 | Fill #0

## 2019-06-04 NOTE — Therapy (Signed)
[place], [place], [address] Phone: [phone]   Fax:  [phone]  Physical Therapy Evaluation  Patient Details  Name: [name] MRN: [id number] Date of Birth: May 16, 1951 Referring Provider (PT): Dr Rodell Perna    Encounter Date: 06/03/2019  PT End of Session - 06/03/19 1211    Visit Number  1    Number of Visits  12    Date for PT Re-Evaluation  07/15/19    Authorization Type  Medicare progress note at 10 visits Kx at 58   PT Start Time  [id number]   Patient 9 minutes late   PT Stop Time  1235    PT Time Calculation (min)  41 min    Activity Tolerance  Patient tolerated treatment well    Behavior During Therapy  [facility] for tasks assessed/performed       Past Medical History:  Diagnosis Date  . Colon polyps   . Deficiency anemia 07/06/2014  . Diabetes mellitus   . Elevated LFTs [place]   lobular hepatitis and fibrosis on biopsy  . GERD (gastroesophageal reflux disease)   . Heart murmur   . Hypercholesteremia   . Hypertension   . Hypothyroidism   . Inflammation of labium 11/01/15   with benign biopsy of right labium  . Leukocytosis 07/06/2014  . Melanoma ([place]) 8/06 L cheek(clarks level 2) DrTafeen  . Sleep apnea, obstructive severe,02/2010   not using CPAP  . TIA (transient ischemic attack) 05/2007  . TIA (transient ischemic attack) 2009  . Vitamin D deficiency     Past Surgical History:  Procedure Laterality Date  . BREAST SURGERY  benign breast tumor removed 1974  . cardiolite stress  normal  11/04 (Dr. Jaci Standard)  . CATARACT EXTRACTION W/PHACO  11/28/2011   Procedure: CATARACT EXTRACTION PHACO AND INTRAOCULAR LENS PLACEMENT (IOC);  Surgeon: Elta Guadeloupe T. Gershon Crane, MD;  Location: [facility];  Service: Ophthalmology;  Laterality: Left;  CDE 6.72  . CESAREAN SECTION  x 1; 1987  . COLONOSCOPY  DrHayes  . DILATION AND CURETTAGE OF UTERUS  11/2007  . HEMORRHOID SURGERY  external  . MELANOMA EXCISION  L cheek  8/06  . OTHER SURGICAL HISTORY Left 11/01/15   left labium    There were no vitals filed for this visit.   Subjective Assessment - 06/03/19 1201    Subjective  Patient has a hisotry of lwer back pain. She fell in March which increased her lower back pain. She has had several falls in the last couple weeks. She reports round about 10 falls in less then a month. Her leg gives out on her.    How long can you sit comfortably?  Sits pretty much allday long    How long can you stand comfortably?  can not stand for more then a minute or2    How long can you walk comfortably?  limited distances without falling    Diagnostic tests  MRI Lumbar: Moderate to severe anterioleathesis    Currently in Pain?  Yes    Pain Score  5     Pain Location  Back    Pain Orientation  Left    Pain Descriptors / Indicators  Aching    Pain Radiating Towards  Pain radiates down to her knee. Pain can also go down to her toes    Pain Onset  More than a month ago    Pain Frequency  Constant    Aggravating Factors   standing,  walking,    Pain Relieving Factors  rest, ibuprofin    Effect of Pain on Daily Activities  frequent falls         [facility] PT Assessment - 06/04/19 0001      Assessment   Medical Diagnosis  Low Back Pain/ Left Leg Weakness/ Frequent falls     Referring Provider (PT)  Dr Rodell Perna     Onset Date/Surgical Date  --   increased symptoms since March 2020   Hand Dominance  Right    Next MD Visit  Nothing with Dr Lorin Mercy     Prior Therapy  None       Precautions   Precautions  Fall    Precaution Comments  frequent falls. Is using a wide based quad cane       Restrictions   Weight Bearing Restrictions  No      Balance Screen   Has the patient fallen in the past 6 months  Yes    How many times?  10   IN THE LAST MONTH    Has the patient had a decrease in activity level because of a fear of falling?   Yes    Is the patient reluctant to leave their home because of a fear of falling?   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 Numbness at times into her left LE.       Coordination   Gross Motor Movements are Fluid and Coordinated  Yes    Fine Motor Movements are Fluid and Coordinated  Yes      AROM   Overall AROM Comments  No active hip flexion; limited active DF       Strength   Right Hip Flexion  4+/5    Right Hip ABduction  4+/5    Right Hip ADduction  4+/5    Left Hip Flexion  2/5    Left Hip ABduction  2/5    Left Hip ADduction  2/5    Right Knee Flexion  4+/5    Right Knee Extension  4+/5    Left Knee Flexion  3/5    Left Knee Extension  1/5      Palpation   Palpation comment  tenderness to plapation in the left paraspinal and upper gluteal  Transfers   Comments  Min a for stability with sit to stand transfer.       Ambulation/Gait   Gait Comments  limited weight bearing on the left LE. Shifts weight over onto her cane; Left toe drags with gait. Circumdution noted on the left. Looked safer with walker but still has left limitations 2nd to wekness.       Balance   Balance Assessed   Yes      Standardized Balance Assessment   Standardized Balance Assessment  Berg Balance Test;Timed Up and Go Test      Berg Balance Test   Berg comment:  please perfrom next visit      Timed Up and Go Test   TUG  Normal TUG    Normal TUG (seconds)  68    TUG Comments  using quad cane nd min a for balance       High Level Balance   High Level Balance Comments  narrow base mod a; tnadem max a                 Objective measurements completed on examination: See above findings.      [place] Adult PT Treatment/Exercise - 06/04/19 0001      Knee/Hip Exercises: Seated   Long Arc Quad Limitations  unable to perfrom     Other Seated Knee/Hip Exercises  attmepted quad set in wheelchair. Patient had difficulty with concept but was able to get her quad to fire. She reported increased pain when her quad fired. She was advised not to put herself in a lot of pain. Ball aqueeze: 10x, hip abduction with and without yellow band 5x each             PT Education - 06/03/19 1350    Education Details  reviewed use of the walker, safety with transfers and gait, POC, symptom management with exercises.    Person(s) Educated  Patient    Methods  Explanation;Demonstration;Tactile cues;Verbal cues    Comprehension  Verbalized understanding;Returned demonstration;Verbal cues required;Tactile cues required       PT Short Term Goals - 06/04/19 1320      PT SHORT TERM GOAL #1   Title  Patient will be indepdnent with basic HEP    Time  3    Period  Weeks    Status  New    Target Date  06/25/19      PT SHORT TERM GOAL #2   Title  Patient will ambualte 200' safely with rolling walker    Time  3    Period  Weeks    Status  New    Target Date  06/25/19      PT SHORT TERM GOAL #3   Title  Patient will demonstrate 3/5 left knee extension and hip flexion    Time  3    Period  Weeks    Status  New    Target Date  06/25/19        PT Long Term Goals - 06/04/19 1322      PT LONG  TERM GOAL #1   Title  The patient will ambualte 1000' with LRAD without falling in order to perfrom ADL';s    Time  6    Period  Weeks    Status  New    Target Date  07/16/19      PT LONG TERM GOAL #2   Title  Patient will transfer sit to stand independently with LRAD without  increased pain    Time  6    Period  Weeks    Status  New    Target Date  07/16/19      PT LONG TERM GOAL #3   Title  Patient will stand for 10 minutes without increased lower back pain in order to perform IADL's    Time  6    Period  Weeks    Target Date  07/16/19             Plan - 06/04/19 1238    Clinical Impression Statement  Patient is a 68 year old female with significant left sided low back pain and left leg weakness. She has <3/5 strength in her left leg gross. She has difficulty standing and has fallen 10x in the past month. She has a script for a walker. She was strongly advised to fill the script. She has decreased balance in standing. She was given alight exercise program. She had difficulty with a waud set. She reported increased pain with a quad set. She would benefit from skilled therapy for a general strengthening exercise program, gait, and balance training. She is using a wheelchair for any longer distances.    Personal Factors and Comorbidities  Comorbidity 1;Comorbidity 2    Comorbidities  TIA, low bak pain;    Examination-Activity Limitations  Lift;Locomotion Level;Sit;Stand    Examination-Participation Restrictions  Shop;Meal Prep    Stability/Clinical Decision Making  Unstable/Unpredictable   increased neuro symptoms with quad set   Clinical Decision Making  Moderate    Rehab Potential  Fair   significant baseline weakness   PT Frequency  2x / week    PT Duration  6 weeks    PT Treatment/Interventions  ADLs/Self Care Home Management;Cryotherapy;Electrical Stimulation;Iontophoresis 4mg /ml Dexamethasone;Moist Heat;Ultrasound;DME Instruction;Stair training;Gait training;Functional  mobility training;Therapeutic activities;Therapeutic exercise;Neuromuscular re-education;Patient/family education;Manual techniques;Passive range of motion;Dry needling;Taping    PT Next Visit Plan  review light exercises. Consdier doing a BERG, consdier supine strengthening. May not be able to do much. Continue with hip strengthening, consider functional strengthening sit to stands; gait training with the walker; if symptoms remain undstable hold patient until she sees the neurologist    PT Home Exercise Plan  ball squeeze, hip abduction, quad set    Consulted and Agree with Plan of Care  Patient       Patient will benefit from skilled therapeutic intervention in order to improve the following deficits and impairments:  Abnormal gait, Difficulty walking, Decreased endurance, Decreased knowledge of use of DME, Decreased safety awareness, Impaired perceived functional ability, Improper body mechanics, Increased muscle spasms, Increased fascial restricitons, Postural dysfunction, Pain  Visit Diagnosis: Chronic left-sided low back pain with left-sided sciatica  Muscle weakness (generalized)  Other abnormalities of gait and mobility  Repeated falls     Problem List Patient Active Problem List   Diagnosis Date Noted  . Leukocytosis 07/06/2014  . Deficiency anemia 07/06/2014  . History of melanoma 07/06/2014  . Preventive measure 07/06/2014  . Type II or unspecified type diabetes mellitus without mention of complication, uncontrolled 05/12/2013  . Noncompliance with medication regimen 08/26/2012  . Tobacco use disorder 11/29/2011  . Type 2 diabetes mellitus without complication ([place]) 123456  . Depressive disorder, not elsewhere classified 05/01/2011  . Essential hypertension, benign 05/01/2011  . Pure hypercholesterolemia 05/01/2011  . Unspecified hypothyroidism 05/01/2011    Carney Living  PT DPT  06/04/2019, 1:47 PM  [facility]  [place],  [place], [address] Phone: [phone]   Fax:  [phone]  Name: AVALEIGH SHELLHAMMER MRN: [id number] Date of Birth: 02-Nov-1950

## 2019-06-05 ENCOUNTER — Encounter: Payer: Self-pay | Admitting: Physical Therapy

## 2019-06-05 ENCOUNTER — Ambulatory Visit: Payer: Medicare Other | Admitting: Physical Therapy

## 2019-06-05 ENCOUNTER — Other Ambulatory Visit: Payer: Self-pay

## 2019-06-05 DIAGNOSIS — M5442 Lumbago with sciatica, left side: Secondary | ICD-10-CM | POA: Diagnosis not present

## 2019-06-05 DIAGNOSIS — R296 Repeated falls: Secondary | ICD-10-CM

## 2019-06-05 DIAGNOSIS — M6281 Muscle weakness (generalized): Secondary | ICD-10-CM

## 2019-06-05 DIAGNOSIS — G8929 Other chronic pain: Secondary | ICD-10-CM

## 2019-06-05 DIAGNOSIS — R2689 Other abnormalities of gait and mobility: Secondary | ICD-10-CM

## 2019-06-05 NOTE — Therapy (Signed)
[place], [place], [address] Phone: [phone]   Fax:  [phone]  Physical Therapy Treatment  Patient Details  Name: [name] MRN: [id number] Date of Birth: 1950/10/02 Referring Provider (PT): Dr Rodell Perna    Encounter Date: 06/05/2019  PT End of Session - 06/05/19 1122    Visit Number  2    Number of Visits  12    Date for PT Re-Evaluation  07/15/19    Authorization Type  Medicare    PT Start Time  1105    PT Stop Time  1145    PT Time Calculation (min)  40 min       Past Medical History:  Diagnosis Date  . Colon polyps   . Deficiency anemia 07/06/2014  . Diabetes mellitus   . Elevated LFTs Hayes   lobular hepatitis and fibrosis on biopsy  . GERD (gastroesophageal reflux disease)   . Heart murmur   . Hypercholesteremia   . Hypertension   . Hypothyroidism   . Inflammation of labium 11/01/15   with benign biopsy of right labium  . Leukocytosis 07/06/2014  . Melanoma ([place]) 8/06 L cheek(clarks level 2) DrTafeen  . Sleep apnea, obstructive severe,02/2010   not using CPAP  . TIA (transient ischemic attack) 05/2007  . TIA (transient ischemic attack) 2009  . Vitamin D deficiency     Past Surgical History:  Procedure Laterality Date  . BREAST SURGERY  benign breast tumor removed 1974  . cardiolite stress  normal  11/04 (Dr. Jaci Standard)  . CATARACT EXTRACTION W/PHACO  11/28/2011   Procedure: CATARACT EXTRACTION PHACO AND INTRAOCULAR LENS PLACEMENT (IOC);  Surgeon: Elta Guadeloupe T. Gershon Crane, MD;  Location: [facility];  Service: Ophthalmology;  Laterality: Left;  CDE 6.72  . CESAREAN SECTION  x 1; 1987  . COLONOSCOPY  DrHayes  . DILATION AND CURETTAGE OF UTERUS  11/2007  . HEMORRHOID SURGERY  external  . MELANOMA EXCISION  L cheek 8/06  . OTHER SURGICAL HISTORY Left 11/01/15   left labium    There were no vitals filed for this visit.  Subjective Assessment - 06/05/19 1122    Subjective  Constant LLE  pain mostly around the knee. Sometimes the leg get ice cold or burning hot.    Currently in Pain?  Yes    Pain Score  7     Pain Location  Leg    Pain Orientation  Left    Pain Descriptors / Indicators  Aching;Burning;Throbbing                       [facility] Adult PT Treatment/Exercise - 06/05/19 0001      Knee/Hip Exercises: Standing   Heel Raises  10 reps    Hip Flexion  10 reps    Hip Flexion Limitations  right, left unable       Knee/Hip Exercises: Seated   Long Arc Quad Limitations  unable to perfrom     Heel Slides  20 reps    Heel Slides Limitations  using towel on floor , also using towel for hip IR, ER AROM (foot on towel on floor)    Other Seated Knee/Hip Exercises  quad set sitting edge of mat with heel on floor, needs assist to get into position       Knee/Hip Exercises: Supine   Quad Sets  20 reps      Manual Therapy   Manual therapy comments  soft tissue  to left quad, efflourage strokes               PT Short Term Goals - 06/04/19 1320      PT SHORT TERM GOAL #1   Title  Patient will be indepdnent with basic HEP    Time  3    Period  Weeks    Status  New    Target Date  06/25/19      PT SHORT TERM GOAL #2   Title  Patient will ambualte 200' safely with rolling walker    Time  3    Period  Weeks    Status  New    Target Date  06/25/19      PT SHORT TERM GOAL #3   Title  Patient will demonstrate 3/5 left knee extension and hip flexion    Time  3    Period  Weeks    Status  New    Target Date  06/25/19        PT Long Term Goals - 06/04/19 1322      PT LONG TERM GOAL #1   Title  The patient will ambualte 1000' with LRAD without falling in order to perfrom ADL';s    Time  6    Period  Weeks    Status  New    Target Date  07/16/19      PT LONG TERM GOAL #2   Title  Patient will transfer sit to stand independently with LRAD without increased pain    Time  6    Period  Weeks    Status  New    Target Date  07/16/19       PT LONG TERM GOAL #3   Title  Patient will stand for 10 minutes without increased lower back pain in order to perform IADL's    Time  6    Period  Weeks    Target Date  07/16/19            Plan - 06/05/19 1136    Clinical Impression Statement  Pt arrives with quad cane and slow step to pattern. She has a script for a RW and has not gotten it yet. Encouraged her to get it as soon as possible. Used clinic RW for gait training. Able to perform step to pattern however has increased pain after 75 ft. Able to activate quad today with tactile and verbal cues. Soft tissue performed to right quad and she reported decreased pain.    PT Next Visit Plan  review light exercises.Did she get RW? Consdier doing a BERG, consdier supine strengthening. May not be able to do much. Continue with hip strengthening, consider functional strengthening sit to stands; gait training with the walker; if symptoms remain undstable hold patient until she sees the neurologist    PT Home Exercise Plan  ball squeeze, hip abduction, quad set       Patient will benefit from skilled therapeutic intervention in order to improve the following deficits and impairments:  Abnormal gait, Difficulty walking, Decreased endurance, Decreased knowledge of use of DME, Decreased safety awareness, Impaired perceived functional ability, Improper body mechanics, Increased muscle spasms, Increased fascial restricitons, Postural dysfunction, Pain  Visit Diagnosis: Chronic left-sided low back pain with left-sided sciatica  Muscle weakness (generalized)  Other abnormalities of gait and mobility  Repeated falls     Problem List Patient Active Problem List   Diagnosis Date Noted  . Leukocytosis 07/06/2014  . Deficiency anemia  07/06/2014  . History of melanoma 07/06/2014  . Preventive measure 07/06/2014  . Type II or unspecified type diabetes mellitus without mention of complication, uncontrolled 05/12/2013  . Noncompliance with  medication regimen 08/26/2012  . Tobacco use disorder 11/29/2011  . Type 2 diabetes mellitus without complication ([place]) 123456  . Depressive disorder, not elsewhere classified 05/01/2011  . Essential hypertension, benign 05/01/2011  . Pure hypercholesterolemia 05/01/2011  . Unspecified hypothyroidism 05/01/2011    Dorene Ar, PTA 06/05/2019, 12:16 PM  [facility] [address] [place], [place], [address] Phone: [phone]   Fax:  [phone]  Name: [name] MRN: [id number] Date of Birth: 18-Jan-1951

## 2019-06-09 ENCOUNTER — Encounter (HOSPITAL_COMMUNITY): Payer: Self-pay

## 2019-06-09 ENCOUNTER — Other Ambulatory Visit: Payer: Self-pay

## 2019-06-09 ENCOUNTER — Ambulatory Visit: Payer: Medicare Other | Admitting: Physical Therapy

## 2019-06-09 ENCOUNTER — Emergency Department (HOSPITAL_COMMUNITY)
Admission: EM | Admit: 2019-06-09 | Discharge: 2019-06-09 | Discharge status: Against Medical Advice | Payer: Medicare Other | Attending: Emergency Medicine | Admitting: Emergency Medicine

## 2019-06-09 DIAGNOSIS — Z5321 Procedure and treatment not carried out due to patient leaving prior to being seen by health care provider: Secondary | ICD-10-CM | POA: Insufficient documentation

## 2019-06-09 DIAGNOSIS — R296 Repeated falls: Secondary | ICD-10-CM | POA: Diagnosis present

## 2019-06-09 LAB — CBG MONITORING, ED: Glucose-Capillary: 98 mg/dL (ref 70–99)

## 2019-06-09 NOTE — ED Notes (Signed)
PT ENDORSES SHE IS A DIABETIC AND HAS NOT EATEN ALL DAY. CONCERNED ABOUT HER "SUGAR" CBG PERFORMED.

## 2019-06-09 NOTE — ED Triage Notes (Signed)
Pt presents with c/o fall that occurred 30 minutes ago. Pt reports that she had a cortisone injection in her spine for some back issues back in March and since then she has had issues controlling her legs, reporting that her left leg will randomly give out on her. She reports that she has had multiple falls since March. Pt reports that when she fell today, she hit her head. Denies LOC.

## 2019-06-09 NOTE — ED Notes (Signed)
UNABLE TO LOCATE/ 2ND ATTEMPT

## 2019-06-09 NOTE — ED Notes (Addendum)
UNABLE TO LOCATE PATIENT IN LOBBY. ATTEMPT TO UPDATE AND OBTAIN VITALS

## 2019-06-09 NOTE — ED Notes (Signed)
UNABLE TO Gay Filler /3RD ATTEMPT

## 2019-06-11 ENCOUNTER — Other Ambulatory Visit: Payer: Self-pay

## 2019-06-11 ENCOUNTER — Encounter: Payer: Self-pay | Admitting: Physical Therapy

## 2019-06-11 ENCOUNTER — Ambulatory Visit: Payer: Medicare Other | Admitting: Physical Therapy

## 2019-06-11 DIAGNOSIS — R296 Repeated falls: Secondary | ICD-10-CM

## 2019-06-11 DIAGNOSIS — M5442 Lumbago with sciatica, left side: Secondary | ICD-10-CM

## 2019-06-11 DIAGNOSIS — R2689 Other abnormalities of gait and mobility: Secondary | ICD-10-CM

## 2019-06-11 DIAGNOSIS — M6281 Muscle weakness (generalized): Secondary | ICD-10-CM

## 2019-06-11 DIAGNOSIS — G8929 Other chronic pain: Secondary | ICD-10-CM

## 2019-06-12 NOTE — Therapy (Signed)
[place], [place], [address] Phone: [phone]   Fax:  [phone]  Physical Therapy Treatment  Patient Details  Name: [name] MRN: [id number] Date of Birth: Jul 11, 1951 Referring Provider (PT): Dr Rodell Perna    Encounter Date: 06/11/2019  PT End of Session - 06/11/19 1652    Visit Number  3    Number of Visits  12    Date for PT Re-Evaluation  07/15/19    Authorization Type  Medicare    PT Start Time  1616    PT Stop Time  1700    PT Time Calculation (min)  44 min    Activity Tolerance  Patient tolerated treatment well    Behavior During Therapy  [facility] for tasks assessed/performed       Past Medical History:  Diagnosis Date  . Colon polyps   . Deficiency anemia 07/06/2014  . Diabetes mellitus   . Elevated LFTs Hayes   lobular hepatitis and fibrosis on biopsy  . GERD (gastroesophageal reflux disease)   . Heart murmur   . Hypercholesteremia   . Hypertension   . Hypothyroidism   . Inflammation of labium 11/01/15   with benign biopsy of right labium  . Leukocytosis 07/06/2014  . Melanoma ([place]) 8/06 L cheek(clarks level 2) DrTafeen  . Sleep apnea, obstructive severe,02/2010   not using CPAP  . TIA (transient ischemic attack) 05/2007  . TIA (transient ischemic attack) 2009  . Vitamin D deficiency     Past Surgical History:  Procedure Laterality Date  . BREAST SURGERY  benign breast tumor removed 1974  . cardiolite stress  normal  11/04 (Dr. Jaci Standard)  . CATARACT EXTRACTION W/PHACO  11/28/2011   Procedure: CATARACT EXTRACTION PHACO AND INTRAOCULAR LENS PLACEMENT (IOC);  Surgeon: Elta Guadeloupe T. Gershon Crane, MD;  Location: [facility];  Service: Ophthalmology;  Laterality: Left;  CDE 6.72  . CESAREAN SECTION  x 1; 1987  . COLONOSCOPY  DrHayes  . DILATION AND CURETTAGE OF UTERUS  11/2007  . HEMORRHOID SURGERY  external  . MELANOMA EXCISION  L cheek 8/06  . OTHER SURGICAL HISTORY Left 11/01/15   left labium     There were no vitals filed for this visit.  Subjective Assessment - 06/11/19 1625    Subjective  Pain in LLE is 10/10.  Back is 7/10.  [address] and hit her head [place] but never was seen, no signs of concussion. Has a friend giving her a walker to use in the community it is at the house but needs it in her car.    Currently in Pain?  Yes    Pain Location  Generalized    Pain Orientation  Left    Pain Descriptors / Indicators  Sore;Aching    Pain Type  Acute pain    Pain Radiating Towards  knee, thigh    Pain Onset  More than a month ago    Pain Frequency  Constant         [facility] PT Assessment - 06/12/19 0001      Palpation   Palpation comment  pain to palpation to patella and distal thigh      Special Tests    Special Tests  Lumbar    Lumbar Tests  Slump Test;Straight Leg Raise      Slump test   Findings  Negative    Side  Left      Straight Leg Raise   Findings  Positive  Side   Left      Berg Balance Test   Berg comment:  did not perform due to pain         [facility] Adult PT Treatment/Exercise - 06/12/19 0001      Self-Care   Self-Care  Other Self-Care Comments    Heat/Ice Application  to relieve pain    Other Self-Care Comments   knee vs hip pain, referral of pain      Lumbar Exercises: Stretches   Active Hamstring Stretch  2 reps;30 seconds    Single Knee to Chest Stretch  2 reps;30 seconds    Figure 4 Stretch  2 reps;30 seconds    Figure 4 Stretch Limitations  seated      Knee/Hip Exercises: Supine   Quad Sets  Strengthening;Both;1 set;15 reps    Short Arc Quad Sets  Strengthening;Left;1 set;10 reps    Short Arc Quad Sets Limitations  needs PT assist to lift     Hip Adduction Isometric  Strengthening;Both;1 set;10 reps    Hip Adduction Isometric Limitations  then butterfly (ER/IR) x 10     Bridges with [employer]  Strengthening;Both;1 set;10 reps   partial ROM             PT Education - 06/11/19 1651    Education Details  knee vs back pain,  use walker, balance test next visit, HEP    Person(s) Educated  Patient    Methods  Explanation;Demonstration    Comprehension  Verbalized understanding;Returned demonstration       PT Short Term Goals - 06/11/19 1655      PT SHORT TERM GOAL #1   Title  Patient will be indepdnent with basic HEP    Status  On-going      PT SHORT TERM GOAL #2   Title  Patient will ambualte 200' safely with rolling walker    Baseline  asked to bring to next visit    Status  On-going      PT SHORT TERM GOAL #3   Title  Patient will demonstrate 3/5 left knee extension and hip flexion    Status  On-going        PT Long Term Goals - 06/04/19 1322      PT LONG TERM GOAL #1   Title  The patient will ambualte 1000' with LRAD without falling in order to perfrom ADL';s    Time  6    Period  Weeks    Status  New    Target Date  07/16/19      PT LONG TERM GOAL #2   Title  Patient will transfer sit to stand independently with LRAD without increased pain    Time  6    Period  Weeks    Status  New    Target Date  07/16/19      PT LONG TERM GOAL #3   Title  Patient will stand for 10 minutes without increased lower back pain in order to perform IADL's    Time  6    Period  Weeks    Target Date  07/16/19            Plan - 06/12/19 0716    Clinical Impression Statement  Patient has walker but needed help getting it in her car. Walks with cane with LLE locked due to weakness, close supervision by PT. No improvement of pain with LAD of LLE.  Pos SLR but neg Slump test. She has not  had back pain before her most recent fall, today pain due to fall on Monday, soreness.    PT Treatment/Interventions  ADLs/Self Care Home Management;Cryotherapy;Electrical Stimulation;Iontophoresis 4mg /ml Dexamethasone;Moist Heat;Ultrasound;DME Instruction;Stair training;Gait training;Functional mobility training;Therapeutic activities;Therapeutic exercise;Neuromuscular re-education;Patient/family education;Manual  techniques;Passive range of motion;Dry needling;Taping    PT Next Visit Plan  Berg, supine strengthening. Gait with walker    PT Home Exercise Plan  ball squeeze, hip abduction, quad set, hamstring, knee to chest    Consulted and Agree with Plan of Care  Patient       Patient will benefit from skilled therapeutic intervention in order to improve the following deficits and impairments:  Abnormal gait, Difficulty walking, Decreased endurance, Decreased knowledge of use of DME, Decreased safety awareness, Impaired perceived functional ability, Improper body mechanics, Increased muscle spasms, Increased fascial restricitons, Postural dysfunction, Pain  Visit Diagnosis: Chronic left-sided low back pain with left-sided sciatica  Muscle weakness (generalized)  Other abnormalities of gait and mobility  Repeated falls     Problem List Patient Active Problem List   Diagnosis Date Noted  . Leukocytosis 07/06/2014  . Deficiency anemia 07/06/2014  . History of melanoma 07/06/2014  . Preventive measure 07/06/2014  . Type II or unspecified type diabetes mellitus without mention of complication, uncontrolled 05/12/2013  . Noncompliance with medication regimen 08/26/2012  . Tobacco use disorder 11/29/2011  . Type 2 diabetes mellitus without complication ([place]) 123456  . Depressive disorder, not elsewhere classified 05/01/2011  . Essential hypertension, benign 05/01/2011  . Pure hypercholesterolemia 05/01/2011  . Unspecified hypothyroidism 05/01/2011    , 06/12/2019, 7:21 AM  [facility] [address] [place], [place], [address] Phone: [phone]   Fax:  [phone]  Name: [name] MRN: [id number] Date of Birth: [birth year]  Raeford Razor, PT 06/12/19 7:21 AM Phone: [phone] Fax: [phone]

## 2019-06-16 ENCOUNTER — Other Ambulatory Visit: Payer: Self-pay

## 2019-06-16 ENCOUNTER — Ambulatory Visit: Payer: Medicare Other | Admitting: Physical Therapy

## 2019-06-16 ENCOUNTER — Encounter: Payer: Self-pay | Admitting: Physical Therapy

## 2019-06-16 DIAGNOSIS — R2689 Other abnormalities of gait and mobility: Secondary | ICD-10-CM

## 2019-06-16 DIAGNOSIS — M6281 Muscle weakness (generalized): Secondary | ICD-10-CM

## 2019-06-16 DIAGNOSIS — G8929 Other chronic pain: Secondary | ICD-10-CM

## 2019-06-16 DIAGNOSIS — R296 Repeated falls: Secondary | ICD-10-CM

## 2019-06-16 DIAGNOSIS — M5442 Lumbago with sciatica, left side: Secondary | ICD-10-CM

## 2019-06-18 ENCOUNTER — Ambulatory Visit: Payer: Medicare Other | Admitting: Physical Therapy

## 2019-06-18 ENCOUNTER — Other Ambulatory Visit: Payer: Self-pay

## 2019-06-18 ENCOUNTER — Encounter: Payer: Self-pay | Admitting: Physical Therapy

## 2019-06-18 DIAGNOSIS — R296 Repeated falls: Secondary | ICD-10-CM

## 2019-06-18 DIAGNOSIS — M6281 Muscle weakness (generalized): Secondary | ICD-10-CM

## 2019-06-18 DIAGNOSIS — G8929 Other chronic pain: Secondary | ICD-10-CM

## 2019-06-18 DIAGNOSIS — R2689 Other abnormalities of gait and mobility: Secondary | ICD-10-CM

## 2019-06-18 DIAGNOSIS — M5442 Lumbago with sciatica, left side: Secondary | ICD-10-CM | POA: Diagnosis not present

## 2019-06-18 NOTE — Therapy (Signed)
[place], [place], [address] Phone: [phone]   Fax:  [phone]  Physical Therapy Treatment  Patient Details  Name: [name] MRN: [id number] Date of Birth: Aug 05, 1951 Referring Provider (PT): Dr Rodell Perna    Encounter Date: 06/18/2019  PT End of Session - 06/18/19 1819    Visit Number  5    Number of Visits  12    Date for PT Re-Evaluation  07/15/19    Authorization Type  Medicare    PT Start Time  1147    PT Stop Time  1228    PT Time Calculation (min)  41 min    Activity Tolerance  Patient tolerated treatment well    Behavior During Therapy  [facility] for tasks assessed/performed       Past Medical History:  Diagnosis Date  . Colon polyps   . Deficiency anemia 07/06/2014  . Diabetes mellitus   . Elevated LFTs Hayes   lobular hepatitis and fibrosis on biopsy  . GERD (gastroesophageal reflux disease)   . Heart murmur   . Hypercholesteremia   . Hypertension   . Hypothyroidism   . Inflammation of labium 11/01/15   with benign biopsy of right labium  . Leukocytosis 07/06/2014  . Melanoma ([place]) 8/06 L cheek(clarks level 2) DrTafeen  . Sleep apnea, obstructive severe,02/2010   not using CPAP  . TIA (transient ischemic attack) 05/2007  . TIA (transient ischemic attack) 2009  . Vitamin D deficiency     Past Surgical History:  Procedure Laterality Date  . BREAST SURGERY  benign breast tumor removed 1974  . cardiolite stress  normal  11/04 (Dr. Jaci Standard)  . CATARACT EXTRACTION W/PHACO  11/28/2011   Procedure: CATARACT EXTRACTION PHACO AND INTRAOCULAR LENS PLACEMENT (IOC);  Surgeon: Elta Guadeloupe T. Gershon Crane, MD;  Location: [facility];  Service: Ophthalmology;  Laterality: Left;  CDE 6.72  . CESAREAN SECTION  x 1; 1987  . COLONOSCOPY  DrHayes  . DILATION AND CURETTAGE OF UTERUS  11/2007  . HEMORRHOID SURGERY  external  . MELANOMA EXCISION  L cheek 8/06  . OTHER SURGICAL HISTORY Left 11/01/15   left labium     There were no vitals filed for this visit.  Subjective Assessment - 06/18/19 1156    Subjective  Patient reported that she was sore after the last visit. She had pain in her hip that radiated into her quad. The sorneess is much better today and she feels like she is walking better.    How long can you sit comfortably?  Sits pretty much allday long    How long can you stand comfortably?  can not stand for more then a minute or2    How long can you walk comfortably?  limited distances without falling    Diagnostic tests  MRI Lumbar: Moderate to severe anterioleathesis    Currently in Pain?  Yes    Pain Score  5     Pain Location  Back    Pain Orientation  Left    Pain Descriptors / Indicators  Aching    Pain Type  Chronic pain    Pain Onset  More than a month ago    Pain Frequency  Constant    Aggravating Factors   standing and walking    Pain Relieving Factors  rest, ibuprofin    Effect of Pain on Daily Activities  frequent falls  [facility] Adult PT Treatment/Exercise - 06/18/19 0001      High Level Balance   High Level Balance Comments  Narrow base of support eo 2x30 sec hold EC 2x30 sec hold. Improved balance with each trial. supervision with gait belt; tnadem stance eo and eyes closed 3x30 sec each leg      Lumbar Exercises: Stretches   Active Hamstring Stretch  2 reps;30 seconds      Knee/Hip Exercises: Seated   Long Arc Quad Limitations  with assist for mid to end range 2x10     Sit to [employer]  5 reps;2 sets   mod a -> min guard with cuing without hands      Knee/Hip Exercises: Supine   Quad Sets Limitations  2x10 with cuing     Short Arc [employer]  Strengthening;Left;1 set;10 reps    Short Arc Quad Sets Limitations  mod a     Hip Adduction Isometric  Strengthening;Both;1 set;10 reps    Bridges with [employer]  Strengthening;Both;1 set;10 reps   partial ROM             PT Education - 06/18/19 1211    Education Details   reviewed strengthening and symptom mangement.    Person(s) Educated  Patient    Methods  Explanation;Demonstration    Comprehension  Verbalized understanding;Returned demonstration;Verbal cues required;Tactile cues required;Need further instruction       PT Short Term Goals - 06/17/19 0807      PT SHORT TERM GOAL #1   Title  Patient will be indepdnent with basic HEP    Time  3    Period  Weeks    Status  On-going    Target Date  06/25/19      PT SHORT TERM GOAL #2   Title  Patient will ambualte 200' safely with rolling walker    Baseline  asked to bring to next visit    Time  3    Period  Weeks    Status  On-going      PT SHORT TERM GOAL #3   Title  Patient will demonstrate 3/5 left knee extension and hip flexion    Time  3    Period  Weeks    Status  On-going    Target Date  06/25/19        PT Long Term Goals - 06/04/19 1322      PT LONG TERM GOAL #1   Title  The patient will ambualte 1000' with LRAD without falling in order to perfrom ADL';s    Time  6    Period  Weeks    Status  New    Target Date  07/16/19      PT LONG TERM GOAL #2   Title  Patient will transfer sit to stand independently with LRAD without increased pain    Time  6    Period  Weeks    Status  New    Target Date  07/16/19      PT LONG TERM GOAL #3   Title  Patient will stand for 10 minutes without increased lower back pain in order to perform IADL's    Time  6    Period  Weeks    Target Date  07/16/19            Plan - 06/18/19 1819    Clinical Impression Statement  Patient did better with balance today. She was able to perfrom  narrow base with  eyes closed with just supervision. Therapy progresed her to tandem stance eyes open and clsed. As expected the patient required more gaurding with left leg back. Therapy also worked on sit to stand transfers. With encouragement and cuing for technique she was able to progress to mod a without pushing up to min guard. She was adivsed to be  patient with her strength. It is going to take time.    Personal Factors and Comorbidities  Comorbidity 1;Comorbidity 2    Examination-Activity Limitations  Lift;Locomotion Level;Sit;Stand    Stability/Clinical Decision Making  Unstable/Unpredictable    Clinical Decision Making  Moderate    Rehab Potential  Fair    PT Frequency  2x / week    PT Duration  6 weeks    PT Treatment/Interventions  ADLs/Self Care Home Management;Cryotherapy;Electrical Stimulation;Iontophoresis 4mg /ml Dexamethasone;Moist Heat;Ultrasound;DME Instruction;Stair training;Gait training;Functional mobility training;Therapeutic activities;Therapeutic exercise;Neuromuscular re-education;Patient/family education;Manual techniques;Passive range of motion;Dry needling;Taping    PT Next Visit Plan  Berg, supine strengthening. Gait with walker    PT Home Exercise Plan  ball squeeze, hip abduction, quad set, hamstring, knee to chest    Consulted and Agree with Plan of Care  Patient       Patient will benefit from skilled therapeutic intervention in order to improve the following deficits and impairments:  Abnormal gait, Difficulty walking, Decreased endurance, Decreased knowledge of use of DME, Decreased safety awareness, Impaired perceived functional ability, Improper body mechanics, Increased muscle spasms, Increased fascial restricitons, Postural dysfunction, Pain  Visit Diagnosis: Chronic left-sided low back pain with left-sided sciatica  Muscle weakness (generalized)  Other abnormalities of gait and mobility  Repeated falls     Problem List Patient Active Problem List   Diagnosis Date Noted  . Leukocytosis 07/06/2014  . Deficiency anemia 07/06/2014  . History of melanoma 07/06/2014  . Preventive measure 07/06/2014  . Type II or unspecified type diabetes mellitus without mention of complication, uncontrolled 05/12/2013  . Noncompliance with medication regimen 08/26/2012  . Tobacco use disorder 11/29/2011  .  Type 2 diabetes mellitus without complication ([place]) 123456  . Depressive disorder, not elsewhere classified 05/01/2011  . Essential hypertension, benign 05/01/2011  . Pure hypercholesterolemia 05/01/2011  . Unspecified hypothyroidism 05/01/2011    Carney Living PT DPT 06/18/2019, 6:24 PM  [facility] [address] [place], [place], [address] Phone: [phone]   Fax:  [phone]  Name: [name] MRN: [id number] Date of Birth: [birth year]

## 2019-06-18 NOTE — Therapy (Signed)
[place], [place], [address] Phone: [phone]   Fax:  [phone]  Physical Therapy Treatment  Patient Details  Name: [name] MRN: [id number] Date of Birth: 15-Jul-1951 Referring Provider (PT): Dr Rodell Perna    Encounter Date: 06/16/2019    Past Medical History:  Diagnosis Date  . Colon polyps   . Deficiency anemia 07/06/2014  . Diabetes mellitus   . Elevated LFTs Hayes   lobular hepatitis and fibrosis on biopsy  . GERD (gastroesophageal reflux disease)   . Heart murmur   . Hypercholesteremia   . Hypertension   . Hypothyroidism   . Inflammation of labium 11/01/15   with benign biopsy of right labium  . Leukocytosis 07/06/2014  . Melanoma ([place]) 8/06 L cheek(clarks level 2) DrTafeen  . Sleep apnea, obstructive severe,02/2010   not using CPAP  . TIA (transient ischemic attack) 05/2007  . TIA (transient ischemic attack) 2009  . Vitamin D deficiency     Past Surgical History:  Procedure Laterality Date  . BREAST SURGERY  benign breast tumor removed 1974  . cardiolite stress  normal  11/04 (Dr. Jaci Standard)  . CATARACT EXTRACTION W/PHACO  11/28/2011   Procedure: CATARACT EXTRACTION PHACO AND INTRAOCULAR LENS PLACEMENT (IOC);  Surgeon: Elta Guadeloupe T. Gershon Crane, MD;  Location: [facility];  Service: Ophthalmology;  Laterality: Left;  CDE 6.72  . CESAREAN SECTION  x 1; 1987  . COLONOSCOPY  DrHayes  . DILATION AND CURETTAGE OF UTERUS  11/2007  . HEMORRHOID SURGERY  external  . MELANOMA EXCISION  L cheek 8/06  . OTHER SURGICAL HISTORY Left 11/01/15   left labium    There were no vitals filed for this visit.  Subjective Assessment - 06/18/19 1807    Subjective  Patient has not had any falls. She feels like her back is hurting less. Her knee is still hurtin her but shje just hit it ;last week. She is using her walker. She has had near falls but no actual falls. She continues to have numbness in her leg.    How  long can you sit comfortably?  Sits pretty much allday long    How long can you stand comfortably?  can not stand for more then a minute or2    How long can you walk comfortably?  limited distances without falling    Diagnostic tests  MRI Lumbar: Moderate to severe anterioleathesis    Currently in Pain?  Yes    Pain Score  3     Pain Location  Back    Pain Orientation  Left    Pain Descriptors / Indicators  Aching    Pain Type  Chronic pain    Pain Onset  More than a month ago    Pain Frequency  Constant    Aggravating Factors   standing and walking    Effect of Pain on Daily Activities  frequent falls                                 PT Short Term Goals - 06/17/19 0807      PT SHORT TERM GOAL #1   Title  Patient will be indepdnent with basic HEP    Time  3    Period  Weeks    Status  On-going    Target Date  06/25/19      PT SHORT TERM GOAL #2  Title  Patient will ambualte 200' safely with rolling walker    Baseline  asked to bring to next visit    Time  3    Period  Weeks    Status  On-going      PT SHORT TERM GOAL #3   Title  Patient will demonstrate 3/5 left knee extension and hip flexion    Time  3    Period  Weeks    Status  On-going    Target Date  06/25/19        PT Long Term Goals - 06/04/19 1322      PT LONG TERM GOAL #1   Title  The patient will ambualte 1000' with LRAD without falling in order to perfrom ADL';s    Time  6    Period  Weeks    Status  New    Target Date  07/16/19      PT LONG TERM GOAL #2   Title  Patient will transfer sit to stand independently with LRAD without increased pain    Time  6    Period  Weeks    Status  New    Target Date  07/16/19      PT LONG TERM GOAL #3   Title  Patient will stand for 10 minutes without increased lower back pain in order to perform IADL's    Time  6    Period  Weeks    Target Date  07/16/19            Plan - 06/18/19 1808    Personal Factors and Comorbidities   Comorbidity 1;Comorbidity 2    Comorbidities  TIA, low bak pain;    Examination-Activity Limitations  Lift;Locomotion Level;Sit;Stand    Examination-Participation Restrictions  Shop;Meal Prep    Stability/Clinical Decision Making  Unstable/Unpredictable    Rehab Potential  Fair    PT Frequency  2x / week    PT Duration  6 weeks    PT Treatment/Interventions  ADLs/Self Care Home Management;Cryotherapy;Electrical Stimulation;Iontophoresis 4mg /ml Dexamethasone;Moist Heat;Ultrasound;DME Instruction;Stair training;Gait training;Functional mobility training;Therapeutic activities;Therapeutic exercise;Neuromuscular re-education;Patient/family education;Manual techniques;Passive range of motion;Dry needling;Taping    PT Next Visit Plan  Berg, supine strengthening. Gait with walker    PT Home Exercise Plan  ball squeeze, hip abduction, quad set, hamstring, knee to chest    Consulted and Agree with Plan of Care  Patient      Assessment: Patient tolerated treatment well. She had no pain with exercises but still has significant quad weakness. It is better then thew intial eval. She looks better on the walker. She improved with balance with balance training., Therapy will continue to progress as tolerated.   Patient will benefit from skilled therapeutic intervention in order to improve the following deficits and impairments:  Abnormal gait, Difficulty walking, Decreased endurance, Decreased knowledge of use of DME, Decreased safety awareness, Impaired perceived functional ability, Improper body mechanics, Increased muscle spasms, Increased fascial restricitons, Postural dysfunction, Pain  Visit Diagnosis: Chronic left-sided low back pain with left-sided sciatica  Muscle weakness (generalized)  Other abnormalities of gait and mobility  Repeated falls     Problem List Patient Active Problem List   Diagnosis Date Noted  . Leukocytosis 07/06/2014  . Deficiency anemia 07/06/2014  . History of  melanoma 07/06/2014  . Preventive measure 07/06/2014  . Type II or unspecified type diabetes mellitus without mention of complication, uncontrolled 05/12/2013  . Noncompliance with medication regimen 08/26/2012  . Tobacco use disorder 11/29/2011  .  Type 2 diabetes mellitus without complication ([place]) 123456  . Depressive disorder, not elsewhere classified 05/01/2011  . Essential hypertension, benign 05/01/2011  . Pure hypercholesterolemia 05/01/2011  . Unspecified hypothyroidism 05/01/2011    Carney Living 06/18/2019, 6:08 PM  [facility] [address] [place], [place], [address] Phone: [phone]   Fax:  [phone]  Name: [name] MRN: [id number] Date of Birth: Jul 05, 1951

## 2019-06-19 MED FILL — traMADol HCL 50 MG TABS: 50 | 10 days supply | Qty: 10 | Fill #0

## 2019-06-23 ENCOUNTER — Encounter: Payer: Self-pay | Admitting: Physical Therapy

## 2019-06-23 ENCOUNTER — Ambulatory Visit: Payer: Medicare Other | Admitting: Neurology

## 2019-06-23 ENCOUNTER — Other Ambulatory Visit: Payer: Self-pay

## 2019-06-23 ENCOUNTER — Ambulatory Visit: Payer: Medicare Other | Attending: Orthopaedic Surgery | Admitting: Physical Therapy

## 2019-06-23 DIAGNOSIS — R2689 Other abnormalities of gait and mobility: Secondary | ICD-10-CM | POA: Insufficient documentation

## 2019-06-23 DIAGNOSIS — G8929 Other chronic pain: Secondary | ICD-10-CM

## 2019-06-23 DIAGNOSIS — R296 Repeated falls: Secondary | ICD-10-CM | POA: Diagnosis present

## 2019-06-23 DIAGNOSIS — M6281 Muscle weakness (generalized): Secondary | ICD-10-CM | POA: Diagnosis present

## 2019-06-23 DIAGNOSIS — M5442 Lumbago with sciatica, left side: Secondary | ICD-10-CM | POA: Diagnosis not present

## 2019-06-23 NOTE — Therapy (Signed)
[place], [place], [address] Phone: [phone]   Fax:  [phone]  Physical Therapy Treatment  Patient Details  Name: [name] MRN: [id number] Date of Birth: [birth year] Referring Provider (PT): Dr Rodell Perna    Encounter Date: 06/23/2019  PT End of Session - 06/23/19 1215    Visit Number  6    Number of Visits  12    Date for PT Re-Evaluation  07/15/19    Authorization Type  Medicare    PT Start Time  1148    PT Stop Time  1230    PT Time Calculation (min)  42 min    Activity Tolerance  Patient tolerated treatment well    Behavior During Therapy  [facility] for tasks assessed/performed       Past Medical History:  Diagnosis Date  . Colon polyps   . Deficiency anemia 07/06/2014  . Diabetes mellitus   . Elevated LFTs Hayes   lobular hepatitis and fibrosis on biopsy  . GERD (gastroesophageal reflux disease)   . Heart murmur   . Hypercholesteremia   . Hypertension   . Hypothyroidism   . Inflammation of labium 11/01/15   with benign biopsy of right labium  . Leukocytosis 07/06/2014  . Melanoma ([place]) 8/06 L cheek(clarks level 2) DrTafeen  . Sleep apnea, obstructive severe,02/2010   not using CPAP  . TIA (transient ischemic attack) 05/2007  . TIA (transient ischemic attack) 2009  . Vitamin D deficiency     Past Surgical History:  Procedure Laterality Date  . BREAST SURGERY  benign breast tumor removed 1974  . cardiolite stress  normal  11/04 (Dr. Jaci Standard)  . CATARACT EXTRACTION W/PHACO  11/28/2011   Procedure: CATARACT EXTRACTION PHACO AND INTRAOCULAR LENS PLACEMENT (IOC);  Surgeon: Elta Guadeloupe T. Gershon Crane, MD;  Location: [facility];  Service: Ophthalmology;  Laterality: Left;  CDE 6.72  . CESAREAN SECTION  x 1; 1987  . COLONOSCOPY  DrHayes  . DILATION AND CURETTAGE OF UTERUS  11/2007  . HEMORRHOID SURGERY  external  . MELANOMA EXCISION  L cheek 8/06  . OTHER SURGICAL HISTORY Left 11/01/15   left labium     There were no vitals filed for this visit.  Subjective Assessment - 06/23/19 1157    Subjective  Patient reports her knee has been hurting over the weekend. She has been having difficulty sleeping. Her knee has hurt since Saturday.    How long can you sit comfortably?  Sits pretty much allday long    How long can you stand comfortably?  can not stand for more then a minute or2    How long can you walk comfortably?  limited distances without falling    Diagnostic tests  MRI Lumbar: Moderate to severe anterioleathesis    Currently in Pain?  Yes    Pain Score  8     Pain Location  Knee    Pain Orientation  Left    Pain Descriptors / Indicators  Aching    Pain Type  Chronic pain    Pain Onset  More than a month ago    Aggravating Factors   standing and walking    Pain Relieving Factors  rest, ibuprofin    Effect of Pain on Daily Activities  frequent falls                       [facility] Adult PT Treatment/Exercise - 06/23/19 0001  Lumbar Exercises: Seated   Other Seated Lumbar Exercises  clam shell red 2x10       Knee/Hip Exercises: Supine   Quad Sets Limitations  2x10 mod cuing     Short Arc Quad Sets  Strengthening;Left;1 set;10 reps    Hip Adduction Isometric  Strengthening;Both;1 set;10 reps    Bridges with [employer]  Strengthening;Both;1 set;10 reps   partial ROM      Manual Therapy   Manual therapy comments  Grade I PA and AP glides to decrease pain. Trigger point release to IT band and lateral knee.              PT Education - 06/23/19 1212    Education Details  Importance of firing the muscle    Person(s) Educated  Patient    Methods  Explanation;Tactile cues;Verbal cues;Demonstration    Comprehension  Verbalized understanding;Returned demonstration;Tactile cues required;Verbal cues required       PT Short Term Goals - 06/17/19 0807      PT SHORT TERM GOAL #1   Title  Patient will be indepdnent with basic HEP    Time  3    Period   Weeks    Status  On-going    Target Date  06/25/19      PT SHORT TERM GOAL #2   Title  Patient will ambualte 200' safely with rolling walker    Baseline  asked to bring to next visit    Time  3    Period  Weeks    Status  On-going      PT SHORT TERM GOAL #3   Title  Patient will demonstrate 3/5 left knee extension and hip flexion    Time  3    Period  Weeks    Status  On-going    Target Date  06/25/19        PT Long Term Goals - 06/04/19 1322      PT LONG TERM GOAL #1   Title  The patient will ambualte 1000' with LRAD without falling in order to perfrom ADL';s    Time  6    Period  Weeks    Status  New    Target Date  07/16/19      PT LONG TERM GOAL #2   Title  Patient will transfer sit to stand independently with LRAD without increased pain    Time  6    Period  Weeks    Status  New    Target Date  07/16/19      PT LONG TERM GOAL #3   Title  Patient will stand for 10 minutes without increased lower back pain in order to perform IADL's    Time  6    Period  Weeks    Target Date  07/16/19            Plan - 06/23/19 1217    Clinical Impression Statement  Patient was limited by pain today. She still completed her exercsies. She will be seeing antoher orthopedic and a neurologist later in the week. She required increased assistance for all exercises today. She had increased pain with balance activity. We will proceeed per MD reccomendation.    Personal Factors and Comorbidities  Comorbidity 1;Comorbidity 2    Comorbidities  TIA, low bak pain;    Examination-Activity Limitations  Lift;Locomotion Level;Sit;Stand    Examination-Participation Restrictions  Shop;Meal Prep    Stability/Clinical Decision Making  Unstable/Unpredictable    Clinical Decision Making  Moderate    Rehab Potential  Fair    PT Frequency  2x / week    PT Duration  6 weeks    PT Treatment/Interventions  ADLs/Self Care Home Management;Cryotherapy;Electrical Stimulation;Iontophoresis 4mg /ml  Dexamethasone;Moist Heat;Ultrasound;DME Instruction;Stair training;Gait training;Functional mobility training;Therapeutic activities;Therapeutic exercise;Neuromuscular re-education;Patient/family education;Manual techniques;Passive range of motion;Dry needling;Taping    PT Next Visit Plan  Berg, supine strengthening. Gait with walker    PT Home Exercise Plan  ball squeeze, hip abduction, quad set, hamstring, knee to chest    Consulted and Agree with Plan of Care  Patient       Patient will benefit from skilled therapeutic intervention in order to improve the following deficits and impairments:  Abnormal gait, Difficulty walking, Decreased endurance, Decreased knowledge of use of DME, Decreased safety awareness, Impaired perceived functional ability, Improper body mechanics, Increased muscle spasms, Increased fascial restricitons, Postural dysfunction, Pain  Visit Diagnosis: Chronic left-sided low back pain with left-sided sciatica  Muscle weakness (generalized)  Other abnormalities of gait and mobility  Repeated falls     Problem List Patient Active Problem List   Diagnosis Date Noted  . Leukocytosis 07/06/2014  . Deficiency anemia 07/06/2014  . History of melanoma 07/06/2014  . Preventive measure 07/06/2014  . Type II or unspecified type diabetes mellitus without mention of complication, uncontrolled 05/12/2013  . Noncompliance with medication regimen 08/26/2012  . Tobacco use disorder 11/29/2011  . Type 2 diabetes mellitus without complication ([place]) 123456  . Depressive disorder, not elsewhere classified 05/01/2011  . Essential hypertension, benign 05/01/2011  . Pure hypercholesterolemia 05/01/2011  . Unspecified hypothyroidism 05/01/2011    Carney Living PT DPT  06/23/2019, 1:51 PM  [facility] [address] [place], [place], [address] Phone: [phone]   Fax:  [phone]  Name: CAMDEN GOODENOUGH MRN:  [id number] Date of Birth: 08/31/1951

## 2019-06-25 ENCOUNTER — Encounter: Payer: Self-pay | Admitting: Gynecology

## 2019-06-25 ENCOUNTER — Other Ambulatory Visit: Payer: Self-pay

## 2019-06-25 ENCOUNTER — Encounter: Payer: Self-pay | Admitting: Physical Therapy

## 2019-06-25 ENCOUNTER — Ambulatory Visit: Payer: Medicare Other | Admitting: Physical Therapy

## 2019-06-25 DIAGNOSIS — M5442 Lumbago with sciatica, left side: Secondary | ICD-10-CM | POA: Diagnosis not present

## 2019-06-25 DIAGNOSIS — R2689 Other abnormalities of gait and mobility: Secondary | ICD-10-CM

## 2019-06-25 DIAGNOSIS — G8929 Other chronic pain: Secondary | ICD-10-CM

## 2019-06-25 DIAGNOSIS — M6281 Muscle weakness (generalized): Secondary | ICD-10-CM

## 2019-06-25 DIAGNOSIS — R296 Repeated falls: Secondary | ICD-10-CM

## 2019-06-25 NOTE — Therapy (Addendum)
[place], [place], [address] Phone: [phone]   Fax:  [phone]  Physical Therapy Treatment/Discharge   Patient Details  Name: [name] MRN: [id number] Date of Birth: [birth year] Referring Provider (PT): Dr Rodell Perna    Encounter Date: 06/25/2019  PT End of Session - 06/25/19 1322    Visit Number  7    Number of Visits  12    Date for PT Re-Evaluation  07/15/19    Authorization Type  Medicare    PT Start Time  1150    PT Stop Time  1210   Patient will be re-evaluated for a potential tendon rupture. Light exercises caused pain. Patient put on hold.   PT Time Calculation (min)  20 min    Activity Tolerance  Patient tolerated treatment well    Behavior During Therapy  WFL for tasks assessed/performed       Past Medical History:  Diagnosis Date  . Colon polyps   . Deficiency anemia 07/06/2014  . Diabetes mellitus   . Elevated LFTs Hayes   lobular hepatitis and fibrosis on biopsy  . GERD (gastroesophageal reflux disease)   . Heart murmur   . Hypercholesteremia   . Hypertension   . Hypothyroidism   . Inflammation of labium 11/01/15   with benign biopsy of right labium  . Leukocytosis 07/06/2014  . Melanoma ([place]) 8/06 L cheek(clarks level 2) DrTafeen  . Sleep apnea, obstructive severe,02/2010   not using CPAP  . TIA (transient ischemic attack) 05/2007  . TIA (transient ischemic attack) 2009  . Vitamin D deficiency     Past Surgical History:  Procedure Laterality Date  . BREAST SURGERY  benign breast tumor removed 1974  . cardiolite stress  normal  11/04 (Dr. Jaci Standard)  . CATARACT EXTRACTION W/PHACO  11/28/2011   Procedure: CATARACT EXTRACTION PHACO AND INTRAOCULAR LENS PLACEMENT (IOC);  Surgeon: Elta Guadeloupe T. Gershon Crane, MD;  Location: [facility];  Service: Ophthalmology;  Laterality: Left;  CDE 6.72  . CESAREAN SECTION  x 1; 1987  . COLONOSCOPY  DrHayes  . DILATION AND CURETTAGE OF UTERUS  11/2007   . HEMORRHOID SURGERY  external  . MELANOMA EXCISION  L cheek 8/06  . OTHER SURGICAL HISTORY Left 11/01/15   left labium    There were no vitals filed for this visit.  Subjective Assessment - 06/25/19 1320    Subjective  Patient has been to Dr Berenice Primas for a second opinion on her knee. He would like her to have an MRI to assess for a quad tendon rupture. She reports she has her tramdolwhich has heled. She also has a knee immobilizer.    How long can you sit comfortably?  Sits pretty much allday long    How long can you stand comfortably?  can not stand for more then a minute or2    How long can you walk comfortably?  limited distances without falling    Diagnostic tests  MRI Lumbar: Moderate to severe anterioleathesis    Currently in Pain?  Yes    Pain Score  4     Pain Location  Knee    Pain Orientation  Left    Pain Descriptors / Indicators  Aching    Pain Type  Chronic pain    Pain Radiating Towards  knee and thigh    Pain Onset  More than a month ago    Pain Frequency  Constant    Aggravating Factors   standing  and walking    Pain Relieving Factors  rest, ibuproifin, tramadol    Effect of Pain on Daily Activities  frequent falls                       [facility] Adult PT Treatment/Exercise - 06/25/19 0001      Self-Care   Other Self-Care Comments   reviewedplan going forward. Reviewed transfer with the walker; reviewed use of the immobilizar to prevent falls. Spoke to patient about plan going forward. Patient advised to hold on exercises until she has her MRI.       Knee/Hip Exercises: Supine   Quad Sets Limitations  2x10. Did well with the first set. On the second set she had pain. PT will hold pending new results              PT Education - 06/25/19 1322    Education Details  reviewed exercises going forward    Person(s) Educated  Patient    Methods  Explanation;Demonstration;Tactile cues;Verbal cues    Comprehension  Returned demonstration;Verbalized  understanding;Verbal cues required;Tactile cues required       PT Short Term Goals - 06/17/19 0807      PT SHORT TERM GOAL #1   Title  Patient will be indepdnent with basic HEP    Time  3    Period  Weeks    Status  On-going    Target Date  06/25/19      PT SHORT TERM GOAL #2   Title  Patient will ambualte 200' safely with rolling walker    Baseline  asked to bring to next visit    Time  3    Period  Weeks    Status  On-going      PT SHORT TERM GOAL #3   Title  Patient will demonstrate 3/5 left knee extension and hip flexion    Time  3    Period  Weeks    Status  On-going    Target Date  06/25/19        PT Long Term Goals - 06/04/19 1322      PT LONG TERM GOAL #1   Title  The patient will ambualte 1000' with LRAD without falling in order to perfrom ADL';s    Time  6    Period  Weeks    Status  New    Target Date  07/16/19      PT LONG TERM GOAL #2   Title  Patient will transfer sit to stand independently with LRAD without increased pain    Time  6    Period  Weeks    Status  New    Target Date  07/16/19      PT LONG TERM GOAL #3   Title  Patient will stand for 10 minutes without increased lower Carson pain in order to perform IADL's    Time  6    Period  Weeks    Target Date  07/16/19            Plan - 06/25/19 1323    Clinical Impression Statement  Patient was put on hold. Patient tried light exercises but had increased pain. We will hold pending the results of her MRI. She was advised to continue using her walker. She tried the other day with just the brace to walk and nearly fell. Therapy reviewed use of the immobilzaer to help decrease her fall risk.    Personal Factors  and Comorbidities  Comorbidity 1;Comorbidity 2    Comorbidities  TIA, low bak pain;    Examination-Activity Limitations  Lift;Locomotion Level;Sit;Stand    Examination-Participation Restrictions  Shop;Meal Prep    Stability/Clinical Decision Making  Unstable/Unpredictable    Clinical  Decision Making  Moderate    Rehab Potential  Fair    PT Frequency  2x / week    PT Duration  6 weeks    PT Treatment/Interventions  ADLs/Self Care Home Management;Cryotherapy;Electrical Stimulation;Iontophoresis 13m/ml Dexamethasone;Moist Heat;Ultrasound;DME Instruction;Stair training;Gait training;Functional mobility training;Therapeutic activities;Therapeutic exercise;Neuromuscular re-education;Patient/family education;Manual techniques;Passive range of motion;Dry needling;Taping    PT Next Visit Plan  Berg, supine strengthening. Gait with walker    PT Home Exercise Plan  ball squeeze, hip abduction, quad set, hamstring, knee to chest    Consulted and Agree with Plan of Care  Patient       Patient will benefit from skilled therapeutic intervention in order to improve the following deficits and impairments:  Abnormal gait, Difficulty walking, Decreased endurance, Decreased knowledge of use of DME, Decreased safety awareness, Impaired perceived functional ability, Improper body mechanics, Increased muscle spasms, Increased fascial restricitons, Postural dysfunction, Pain  Visit Diagnosis: Chronic left-sided low Carson pain with left-sided sciatica  Muscle weakness (generalized)  Other abnormalities of gait and mobility  Repeated falls   PHYSICAL THERAPY DISCHARGE SUMMARY  Visits from Start of Care:7  Current functional level related to goals / functional outcomes: Sent Carson to MD for follow up    Remaining deficits: Unknown     Education / Equipment: HEP  Plan: Patient agrees to discharge.  Patient goals were not met. Patient is being discharged due to meeting the stated rehab goals.  ?????       Problem List Patient Active Problem List   Diagnosis Date Noted  . Leukocytosis 07/06/2014  . Deficiency anemia 07/06/2014  . History of melanoma 07/06/2014  . Preventive measure 07/06/2014  . Type II or unspecified type diabetes mellitus without mention of complication,  uncontrolled 05/12/2013  . Noncompliance with medication regimen 08/26/2012  . Tobacco use disorder 11/29/2011  . Type 2 diabetes mellitus without complication [place] [id number] . Depressive disorder, not elsewhere classified 05/01/2011  . Essential hypertension, benign 05/01/2011  . Pure hypercholesterolemia 05/01/2011  . Unspecified hypothyroidism 05/01/2011    DCarney LivingPT DPT 06/25/2019, 1:30 PM  [facility] [address]Phone: [phone]  Fax:  [phone] Name: Robin BACKMRN: [id number] of Birth: [birth year]
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
## 2019-06-27 ENCOUNTER — Ambulatory Visit: Payer: Medicare Other | Admitting: Neurology

## 2019-06-27 ENCOUNTER — Encounter: Payer: Self-pay | Admitting: Neurology

## 2019-06-27 ENCOUNTER — Other Ambulatory Visit: Payer: Self-pay | Admitting: Orthopedic Surgery

## 2019-06-27 ENCOUNTER — Other Ambulatory Visit: Payer: Self-pay

## 2019-06-27 VITALS — BP 150/80 | HR 77 | Temp 98.0°F | Ht 65.0 in | Wt 201.0 lb

## 2019-06-27 DIAGNOSIS — R200 Anesthesia of skin: Secondary | ICD-10-CM | POA: Diagnosis not present

## 2019-06-27 DIAGNOSIS — M25562 Pain in left knee: Secondary | ICD-10-CM

## 2019-06-27 DIAGNOSIS — M79605 Pain in left leg: Secondary | ICD-10-CM | POA: Diagnosis not present

## 2019-06-27 DIAGNOSIS — R29898 Other symptoms and signs involving the musculoskeletal system: Secondary | ICD-10-CM | POA: Diagnosis not present

## 2019-06-27 NOTE — Patient Instructions (Signed)
1.  We will repeat MRI of lumbar spine with and without contrast. 2.  Further recommendations pending results.

## 2019-06-30 ENCOUNTER — Ambulatory Visit
Admission: RE | Admit: 2019-06-30 | Discharge: 2019-06-30 | Disposition: A | Discharge status: Home / Self Care | Payer: Medicare Other | Source: Ambulatory Visit | Attending: Obstetrics and Gynecology | Admitting: Obstetrics and Gynecology

## 2019-06-30 ENCOUNTER — Ambulatory Visit
Admission: RE | Admit: 2019-06-30 | Discharge: 2019-06-30 | Disposition: A | Discharge status: Home / Self Care | Payer: Medicare Other | Source: Ambulatory Visit | Attending: Orthopedic Surgery | Admitting: Orthopedic Surgery

## 2019-06-30 ENCOUNTER — Other Ambulatory Visit: Payer: Self-pay

## 2019-06-30 DIAGNOSIS — N63 Unspecified lump in unspecified breast: Secondary | ICD-10-CM

## 2019-06-30 DIAGNOSIS — M25562 Pain in left knee: Secondary | ICD-10-CM

## 2019-06-30 MED FILL — traMADol HCL 50 MG TABS: 50 | 15 days supply | Qty: 30 | Fill #0

## 2019-07-02 ENCOUNTER — Encounter: Payer: Medicare Other | Admitting: Physical Therapy

## 2019-07-04 ENCOUNTER — Encounter: Payer: Medicare Other | Admitting: Physical Therapy

## 2019-07-18 ENCOUNTER — Other Ambulatory Visit: Payer: Self-pay

## 2019-07-18 ENCOUNTER — Ambulatory Visit
Admission: RE | Admit: 2019-07-18 | Discharge: 2019-07-18 | Disposition: A | Discharge status: Home / Self Care | Payer: Medicare Other | Source: Ambulatory Visit | Attending: Neurology | Admitting: Neurology

## 2019-07-18 DIAGNOSIS — R200 Anesthesia of skin: Secondary | ICD-10-CM

## 2019-07-18 DIAGNOSIS — R29898 Other symptoms and signs involving the musculoskeletal system: Secondary | ICD-10-CM

## 2019-07-18 DIAGNOSIS — M79605 Pain in left leg: Secondary | ICD-10-CM

## 2019-07-18 MED ORDER — GADOBENATE DIMEGLUMINE 529 MG/ML IV SOLN
19.0000 mL | Freq: Once | INTRAVENOUS | Status: AC | PRN
  Administered 2019-07-18: 19 mL via INTRAVENOUS

## 2019-07-21 ENCOUNTER — Telehealth: Payer: Self-pay

## 2019-07-21 ENCOUNTER — Telehealth: Payer: Self-pay | Admitting: Neurology

## 2019-07-21 MED FILL — tiZANidine HCL 2 MG TABS: 2 | 10 days supply | Qty: 30 | Fill #0

## 2019-07-21 MED FILL — traMADol HCL 50 MG TABS: 50 | 30 days supply | Qty: 60 | Fill #0

## 2019-07-21 NOTE — Telephone Encounter (Signed)
Called spoke with patient she was informed of results. And understands She is requesting a copy of results be sent to her new orthopedic. Patient aware that release of records will need to be sign prior to sending.  I will mail out form for her to sign and send back prior to sending.

## 2019-07-21 NOTE — Telephone Encounter (Signed)
-----   Message from [place], DO sent at 07/21/2019  8:06 AM EST ----- You can let [name] know that MRI reported to be fairly stable c/t May 2020 MRI.  May be slightly worse disc protrustion at L4-5 on the right but that wouldn't cause L leg pain.  Dr. Tomi Likens is off right now but can discuss more when he gets back

## 2019-07-22 NOTE — Telephone Encounter (Signed)
Error

## 2019-07-23 MED FILL — GABAPENTIN 300 MG CAPSULE: 300 | 30 days supply | Qty: 120 | Fill #1

## 2019-07-23 MED FILL — glipiZIDE ER 10 MG TB24: 10 | 90 days supply | Qty: 90 | Fill #0

## 2019-07-23 MED FILL — SYNTHROID 125 MCG TABLET: 125 | 90 days supply | Qty: 90 | Fill #0

## 2019-07-24 ENCOUNTER — Telehealth: Payer: Self-pay | Admitting: Neurology

## 2019-07-24 NOTE — Telephone Encounter (Signed)
Patient came by the office to fill out a medical Release form. She is asking for a copy of her MRI. Is our office available to give the patient a copy or does she need to go to the Imaging office where the MRI was performed? Please Advise. Thank you

## 2019-07-24 NOTE — Telephone Encounter (Signed)
Release will have to go through medical records. You can send her signed release to medical records from them to release to patient.

## 2019-07-24 NOTE — Telephone Encounter (Signed)
Okay thank you

## 2019-08-08 ENCOUNTER — Other Ambulatory Visit: Payer: Self-pay | Admitting: Orthopedic Surgery

## 2019-08-08 DIAGNOSIS — M25562 Pain in left knee: Secondary | ICD-10-CM

## 2019-08-09 ENCOUNTER — Ambulatory Visit
Admission: RE | Admit: 2019-08-09 | Discharge: 2019-08-09 | Disposition: A | Discharge status: Home / Self Care | Payer: Medicare Other | Source: Ambulatory Visit | Attending: Orthopedic Surgery | Admitting: Orthopedic Surgery

## 2019-08-09 ENCOUNTER — Other Ambulatory Visit: Payer: Self-pay

## 2019-08-09 DIAGNOSIS — M25562 Pain in left knee: Secondary | ICD-10-CM

## 2019-08-19 ENCOUNTER — Telehealth: Payer: Self-pay

## 2019-08-19 DIAGNOSIS — R200 Anesthesia of skin: Secondary | ICD-10-CM

## 2019-08-19 DIAGNOSIS — R29898 Other symptoms and signs involving the musculoskeletal system: Secondary | ICD-10-CM

## 2019-08-19 NOTE — Telephone Encounter (Signed)
Called spoke with patient she was informed of results and understands

## 2019-08-19 NOTE — Telephone Encounter (Signed)
-----   Message from Pieter Partridge, DO sent at 08/19/2019  8:17 AM EST ----- Received results of MRI femur which shows abnormal changes in her quadriceps muscle.  I would like to perform further testing to follow up on this finding (reason:  muscle weakness, muscle atrophy). LABS:  CK, Sed rate, myositis panel.  I would like to repeat NCV-EMG in January and have her follow up with me in office afterward.

## 2019-08-19 NOTE — Telephone Encounter (Signed)
Called patient no answer left message to call office back to discuss results.

## 2019-08-19 NOTE — Telephone Encounter (Signed)
Orders place.

## 2019-08-19 NOTE — Telephone Encounter (Signed)
Pt aware of lab protocol and EMG

## 2019-08-20 MED FILL — tiZANidine HCL 2 MG TABS: 2 | 10 days supply | Qty: 30 | Fill #0

## 2019-08-20 MED FILL — traMADol HCL 50 MG TABS: 50 | 30 days supply | Qty: 60 | Fill #0

## 2019-08-21 ENCOUNTER — Other Ambulatory Visit: Payer: Self-pay

## 2019-08-21 ENCOUNTER — Other Ambulatory Visit: Payer: Medicare Other

## 2019-08-21 DIAGNOSIS — R200 Anesthesia of skin: Secondary | ICD-10-CM

## 2019-08-21 DIAGNOSIS — R29898 Other symptoms and signs involving the musculoskeletal system: Secondary | ICD-10-CM

## 2019-08-21 LAB — CK: Total CK: 70 U/L (ref 7–177)

## 2019-08-21 LAB — SEDIMENTATION RATE: Sed Rate: 23 mm/hr (ref 0–30)

## 2019-08-27 ENCOUNTER — Other Ambulatory Visit: Payer: Self-pay

## 2019-08-27 ENCOUNTER — Encounter: Payer: Self-pay | Admitting: Cardiovascular Disease

## 2019-08-27 ENCOUNTER — Ambulatory Visit: Payer: Medicare Other | Admitting: Cardiovascular Disease

## 2019-08-27 VITALS — BP 128/64 | HR 68 | Temp 96.2°F | Ht 65.0 in | Wt 200.0 lb

## 2019-08-27 DIAGNOSIS — Z01812 Encounter for preprocedural laboratory examination: Secondary | ICD-10-CM | POA: Diagnosis not present

## 2019-08-27 DIAGNOSIS — I10 Essential (primary) hypertension: Secondary | ICD-10-CM | POA: Diagnosis not present

## 2019-08-27 DIAGNOSIS — E7800 Pure hypercholesterolemia, unspecified: Secondary | ICD-10-CM | POA: Diagnosis not present

## 2019-08-27 DIAGNOSIS — R0789 Other chest pain: Secondary | ICD-10-CM

## 2019-08-27 DIAGNOSIS — F17200 Nicotine dependence, unspecified, uncomplicated: Secondary | ICD-10-CM

## 2019-08-27 DIAGNOSIS — R0602 Shortness of breath: Secondary | ICD-10-CM

## 2019-08-27 MED ORDER — METOPROLOL TARTRATE 100 MG PO TABS: ORAL_TABLET | ORAL | 0 refills | Status: DC

## 2019-08-27 MED FILL — METOPROLOL TARTRATE 100 MG: 100 | 1 days supply | Qty: 1 | Fill #0

## 2019-08-27 NOTE — Assessment & Plan Note (Signed)
Long history of minimal tobacco use having quit this past August

## 2019-08-27 NOTE — Patient Instructions (Signed)
Medication Instructions:  Take 100mg  Metoprolol 2 hours prior to CTA. If you need a refill on your cardiac medications before your next appointment, please call your pharmacy.   Lab work: BMET If you have labs (blood work) drawn today and your tests are completely normal, you will receive your results only by: [place] (if you have MyChart) OR A paper copy in the mail If you have any lab test that is abnormal or we need to change your treatment, we will call you to review the results.  Testing/Procedures: Non-Cardiac CT scanning, (CAT scanning), is a noninvasive, special x-ray that produces cross-sectional images of the body using x-rays and a computer. CT scans help physicians diagnose and treat medical conditions. For some CT exams, a contrast material is used to enhance visibility in the area of the body being studied. CT scans provide greater clarity and reveal more details than regular x-ray exams.  Follow-Up: At [facility], you and your health needs are our priority.  As part of our continuing mission to provide you with exceptional heart care, we have created designated Provider Care Teams.  These Care Teams include your primary Cardiologist (physician) and Advanced Practice Providers (APPs -  Physician Assistants and Nurse Practitioners) who all work together to provide you with the care you need, when you need it. You may see Dr Gwenlyn Found or one of the following Advanced Practice Providers on your designated Care Team:    Kerin Ransom, PA-C  [place], [place]  Coletta Memos, [place] Your physician wants you to follow-up as needed.   Any Other Special Instructions Will Be Listed Below (If Applicable).  Your cardiac CT will be scheduled at one of the below locations:   [facility] [address] [place], [place] [address] [phone]  If scheduled at [facility], please arrive at the [facility] main entrance of [facility] 30-45 minutes  prior to test start time. Proceed to the [facility] Radiology Department (first floor) to check-in and test prep.  Please follow these instructions carefully (unless otherwise directed):   On the Night Before the Test: . Be sure to Drink plenty of water. . Do not consume any caffeinated/decaffeinated beverages or chocolate 12 hours prior to your test. . Do not take any antihistamines 12 hours prior to your test.  On the Day of the Test: . Drink plenty of water. Do not drink any water within one hour of the test. . Do not eat any food 4 hours prior to the test. . You may take your regular medications prior to the test.  . Take 100mg  metoprolol (Lopressor) two hours prior to test. . HOLD Furosemide/Hydrochlorothiazide morning of the test. . FEMALES- please wear underwire-free bra if available      After the Test: . Drink plenty of water. . After receiving IV contrast, you may experience a mild flushed feeling. This is normal. . On occasion, you may experience a mild rash up to 24 hours after the test. This is not dangerous. If this occurs, you can take Benadryl 25 mg and increase your fluid intake. . If you experience trouble breathing, this can be serious. If it is severe call 911 IMMEDIATELY. If it is mild, please call our office. . If you take any of these medications: Glipizide/Metformin, Avandament, Glucavance, please do not take 48 hours after completing test unless otherwise instructed.   Once we have confirmed authorization from your insurance company, we will call you to set up  a date and time for your test.   For non-scheduling related questions, please contact the cardiac imaging nurse navigator should you have any questions/concerns: Marchia Bond, RN Navigator Cardiac Imaging [facility] [phone] Office

## 2019-08-27 NOTE — Assessment & Plan Note (Signed)
History of essential potential blood pressure measured today 128/64.  She is on Micardis and hydrochlorothiazide.

## 2019-08-27 NOTE — Assessment & Plan Note (Signed)
History of hyperlipidemia on statin therapy with lipid profile performed 03/04/2019 revealed a total cholesterol of 195, LDL 127 and HDL of 38.

## 2019-08-27 NOTE — Assessment & Plan Note (Signed)
[name] is referred to me by Dr. Radene Ou for atypical chest pain.  The pain began back in March when she fell.  It occurs daily.  There are 2 types of pain 1 constant left breast pain and 1 fleeting quick sharp pain.  There are no other associated symptoms.  Given her risk factor profile however I am to get a coronary CTA to rule out an ischemic etiology.
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
## 2019-08-28 LAB — BASIC METABOLIC PANEL
BUN/Creatinine Ratio: 33 — ABNORMAL HIGH (ref 12–28)
BUN: 18 mg/dL (ref 8–27)
CO2: 27 mmol/L (ref 20–29)
Calcium: 10.1 mg/dL (ref 8.7–10.3)
Chloride: 97 mmol/L (ref 96–106)
Creatinine, Ser: 0.54 mg/dL — ABNORMAL LOW (ref 0.57–1.00)
GFR calc Af Amer: 114 mL/min/{1.73_m2} (ref >59)
GFR calc non Af Amer: 99 mL/min/{1.73_m2} (ref >59)
Glucose: 113 mg/dL — ABNORMAL HIGH (ref 65–99)
Potassium: 4.7 mmol/L (ref 3.5–5.2)
Sodium: 139 mmol/L (ref 134–144)

## 2019-09-05 MED FILL — NYSTATIN 100,000 UNITS/GM O: 100000 | 15 days supply | Qty: 30 | Fill #0

## 2019-09-09 MED FILL — GABAPENTIN 300 MG CAPSULE: 300 | 90 days supply | Qty: 360 | Fill #0

## 2019-09-14 LAB — MYOMARKER 3 PLUS PROFILE (RDL)

## 2019-09-16 MED FILL — tiZANidine HCL 2 MG TABS: 2 | 10 days supply | Qty: 30 | Fill #0

## 2019-09-17 MED FILL — traMADol HCL 50 MG TABS: 50 | 30 days supply | Qty: 60 | Fill #0

## 2019-10-16 ENCOUNTER — Encounter (HOSPITAL_COMMUNITY): Payer: Self-pay

## 2019-10-16 ENCOUNTER — Telehealth (HOSPITAL_COMMUNITY): Payer: Self-pay | Admitting: Emergency Medicine

## 2019-10-16 NOTE — Telephone Encounter (Signed)
Reaching out to patient to offer assistance regarding upcoming cardiac imaging study; pt verbalizes understanding of appt date/time, parking situation and where to check in, pre-test NPO status and medications ordered, and verified current allergies; name and call back number provided for further questions should they arise Shailene Demonbreun RN Navigator Cardiac Imaging [facility] [phone] office [phone] cell 

## 2019-10-17 ENCOUNTER — Ambulatory Visit (HOSPITAL_COMMUNITY)
Admission: RE | Admit: 2019-10-17 | Discharge: 2019-10-17 | Disposition: A | Discharge status: Home / Self Care | Payer: Medicare HMO | Source: Ambulatory Visit | Attending: Cardiovascular Disease | Admitting: Cardiovascular Disease

## 2019-10-17 ENCOUNTER — Other Ambulatory Visit: Payer: Self-pay

## 2019-10-17 DIAGNOSIS — R079 Chest pain, unspecified: Secondary | ICD-10-CM | POA: Insufficient documentation

## 2019-10-17 DIAGNOSIS — I700 Atherosclerosis of aorta: Secondary | ICD-10-CM | POA: Insufficient documentation

## 2019-10-17 DIAGNOSIS — R0602 Shortness of breath: Secondary | ICD-10-CM | POA: Diagnosis present

## 2019-10-17 DIAGNOSIS — I2510 Atherosclerotic heart disease of native coronary artery without angina pectoris: Secondary | ICD-10-CM | POA: Insufficient documentation

## 2019-10-17 DIAGNOSIS — Z87891 Personal history of nicotine dependence: Secondary | ICD-10-CM | POA: Diagnosis not present

## 2019-10-17 MED ORDER — IOHEXOL 350 MG/ML SOLN
80.0000 mL | Freq: Once | INTRAVENOUS | Status: AC | PRN
  Administered 2019-10-17: 80 mL via INTRAVENOUS

## 2019-10-17 MED ORDER — NITROGLYCERIN 0.4 MG SL SUBL
SUBLINGUAL_TABLET | SUBLINGUAL | Status: AC
  Filled 2019-10-17: qty 2

## 2019-10-17 NOTE — Progress Notes (Signed)
Patient ID: [name], female   DOB: 10-30-1950, 68 y.o.   MRN: [id number]   Pt slightly nauseated after contrast, no meds needed, no emesis; pt offered ginger ale.  Pt tolerated exam without incident; pt denies lightheadedness or dizziness; pt back to lobby in wheelchair

## 2019-10-18 ENCOUNTER — Ambulatory Visit: Payer: Medicare Other

## 2019-10-18 DIAGNOSIS — I2510 Atherosclerotic heart disease of native coronary artery without angina pectoris: Secondary | ICD-10-CM | POA: Diagnosis not present

## 2019-10-18 DIAGNOSIS — I700 Atherosclerosis of aorta: Secondary | ICD-10-CM | POA: Diagnosis not present

## 2019-10-21 ENCOUNTER — Other Ambulatory Visit: Payer: Self-pay

## 2019-10-21 ENCOUNTER — Ambulatory Visit: Payer: Medicare HMO | Admitting: Cardiovascular Disease

## 2019-10-21 ENCOUNTER — Encounter: Payer: Self-pay | Admitting: Cardiovascular Disease

## 2019-10-21 DIAGNOSIS — R0789 Other chest pain: Secondary | ICD-10-CM | POA: Diagnosis not present

## 2019-10-21 MED ORDER — SODIUM CHLORIDE 0.9% FLUSH: 3.0000 mL | Freq: Two times a day (BID) | INTRAVENOUS | Status: DC
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
## 2019-10-21 NOTE — Patient Instructions (Addendum)
Medication Instructions:  Take Aspirin Daily   If you need a refill on your cardiac medications before your next appointment, please call your pharmacy.   Lab work: BMET If you have labs (blood work) drawn today and your tests are completely normal, you will receive your results only by: [place] (if you have MyChart) OR A paper copy in the mail If you have any lab test that is abnormal or we need to change your treatment, we will call you to review the results.  Testing/Procedures: Your physician has requested that you have a cardiac catheterization on Monday February 15th @ 7:30am. Cardiac catheterization is used to diagnose and/or treat various heart conditions. Doctors may recommend this procedure for a number of different reasons. The most common reason is to evaluate chest pain. Chest pain can be a symptom of coronary artery disease (CAD), and cardiac catheterization can show whether plaque is narrowing or blocking your heart's arteries. This procedure is also used to evaluate the valves, as well as measure the blood flow and oxygen levels in different parts of your heart. For further information please visit [email or web address]. Please follow instruction sheet, as given.  AND  Your physician has requested that you have an echocardiogram this coming week. Echocardiography is a painless test that uses sound waves to create images of your heart. It provides your doctor with information about the size and shape of your heart and how well your heart's chambers and valves are working. This procedure takes approximately one hour. There are no restrictions for this procedure. [place] [address]  Follow-Up: At [facility], you and your health needs are our priority.  As part of our continuing mission to provide you with exceptional heart care, we have created designated Provider Care Teams.  These Care Teams include your primary Cardiologist (physician) and Advanced Practice  Providers (APPs -  Physician Assistants and Nurse Practitioners) who all work together to provide you with the care you need, when you need it. You may see Dr. Gwenlyn Found or one of the following Advanced Practice Providers on your designated Care Team:    Kerin Ransom, PA-C  [place], [place]  Coletta Memos, [place]  Your physician wants you to follow-up: after procedure  Any Other Special Instructions Will Be Listed Below (If Applicable).   You are scheduled for a Cardiac Catheterization on Monday, February 15 with Dr. Quay Burow.  1. Please arrive at the [facility] (Main Entrance A) at [facility]: [address] [place], [place] [address] at 7:30 AM (This time is two hours before your procedure to ensure your preparation). Free valet parking service is available.   Special note: Every effort is made to have your procedure done on time. Please understand that emergencies sometimes delay scheduled procedures.  2. Diet: Do not eat solid foods after midnight.  The patient may have clear liquids until 5am upon the day of the procedure.  3. Labs: You will need to have blood drawn on Today  4. Medication instructions in preparation for your procedure:   Contrast Allergy: No  Hold Metformin 24 hours prior to procedure and 48 hours after procedure  On the morning of your procedure, take your Aspirin and any morning medicines NOT listed above.  You may use sips of water.  5. Plan for one night stay--bring personal belongings. 6. Bring a current list of your medications and current insurance cards. 7. You MUST have a responsible person to drive you home. 8.  Someone MUST be with you the first 24 hours after you arrive home or your discharge will be delayed. 9. Please wear clothes that are easy to get on and off and wear slip-on shoes.  You have to be tested for Covid prior to procedure. This will be scheduled on February 12th at 1:05pm. This is a Drive Up Visit at the [facility] [address], [place]. Someone will direct you to the appropriate testing line. Stay in your car and someone will be with you shortly.  Thank you for allowing [place] to care for you!   -- [facility]

## 2019-10-21 NOTE — Assessment & Plan Note (Signed)
[name] returns today for follow-up of her coronary CTA done in the evaluation of atypical chest pain on 10/18/2019 revealing a coronary calcium score of 813 with three-vessel disease.  I am going to arrange for her to undergo coronary angiography by myself on the 15th via the right radial approach.
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
## 2019-10-22 LAB — BASIC METABOLIC PANEL
BUN/Creatinine Ratio: 30 — ABNORMAL HIGH (ref 12–28)
BUN: 19 mg/dL (ref 8–27)
CO2: 26 mmol/L (ref 20–29)
Calcium: 9.9 mg/dL (ref 8.7–10.3)
Chloride: 100 mmol/L (ref 96–106)
Creatinine, Ser: 0.64 mg/dL (ref 0.57–1.00)
GFR calc Af Amer: 108 mL/min/{1.73_m2} (ref >59)
GFR calc non Af Amer: 93 mL/min/{1.73_m2} (ref >59)
Glucose: 93 mg/dL (ref 65–99)
Potassium: 4.2 mmol/L (ref 3.5–5.2)
Sodium: 140 mmol/L (ref 134–144)

## 2019-10-24 MED FILL — SYNTHROID 125 MCG TABLET: 125 | 90 days supply | Qty: 90 | Fill #1

## 2019-10-24 MED FILL — glipiZIDE ER 10 MG TB24: 10 | 90 days supply | Qty: 90 | Fill #0

## 2019-10-26 ENCOUNTER — Ambulatory Visit: Payer: Medicare HMO | Attending: Internal Medicine

## 2019-10-26 DIAGNOSIS — Z23 Encounter for immunization: Secondary | ICD-10-CM | POA: Insufficient documentation

## 2019-10-26 NOTE — Progress Notes (Signed)
   Covid-19 Vaccination Clinic  Name:  [name]    MRN: [id number] DOB: [birth year]  10/26/2019  [name] was observed post Covid-19 immunization for 30 minutes based on pre-vaccination screening without incidence. She was provided with Vaccine Information Sheet and instruction to access the V-Safe system.   [name] was instructed to call 911 with any severe reactions post vaccine: Marland Kitchen Difficulty breathing  . Swelling of your face and throat  . A fast heartbeat  . A bad rash all over your body  . Dizziness and weakness    Immunizations Administered    Name Date Dose VIS Date Route   Pfizer COVID-19 Vaccine 10/26/2019  8:44 AM 0.3 mL 08/29/2019 Intramuscular   Manufacturer: [place]   Lot: [id number]   [place]: [id number]

## 2019-10-27 MED FILL — traMADol HCL 50 MG TABS: 50 | 15 days supply | Qty: 30 | Fill #0

## 2019-10-27 MED FILL — SERTRALINE HCL 50 MG TABLET: 50 | 90 days supply | Qty: 90 | Fill #0

## 2019-10-27 MED FILL — ATORVASTATIN 20 MG TABLET: 20 | 90 days supply | Qty: 90 | Fill #0

## 2019-10-27 MED FILL — tiZANidine HCL 2 MG TABS: 2 | 10 days supply | Qty: 30 | Fill #0

## 2019-10-27 MED FILL — METFORMIN HCL ER 500 MG TB2: 500 | 90 days supply | Qty: 360 | Fill #0

## 2019-10-28 NOTE — Telephone Encounter (Signed)
Error

## 2019-10-29 ENCOUNTER — Ambulatory Visit: Payer: Medicare Other

## 2019-10-29 ENCOUNTER — Ambulatory Visit (HOSPITAL_COMMUNITY)
Admission: RE | Admit: 2019-10-29 | Discharge: 2019-10-29 | Disposition: A | Discharge status: Home / Self Care | Payer: Medicare HMO | Source: Ambulatory Visit | Attending: Cardiovascular Disease | Admitting: Cardiovascular Disease

## 2019-10-29 ENCOUNTER — Telehealth: Payer: Self-pay

## 2019-10-29 ENCOUNTER — Other Ambulatory Visit: Payer: Self-pay

## 2019-10-29 DIAGNOSIS — Z87891 Personal history of nicotine dependence: Secondary | ICD-10-CM | POA: Diagnosis not present

## 2019-10-29 DIAGNOSIS — E785 Hyperlipidemia, unspecified: Secondary | ICD-10-CM | POA: Insufficient documentation

## 2019-10-29 DIAGNOSIS — G4730 Sleep apnea, unspecified: Secondary | ICD-10-CM | POA: Insufficient documentation

## 2019-10-29 DIAGNOSIS — Z8673 Personal history of transient ischemic attack (TIA), and cerebral infarction without residual deficits: Secondary | ICD-10-CM | POA: Insufficient documentation

## 2019-10-29 DIAGNOSIS — R011 Cardiac murmur, unspecified: Secondary | ICD-10-CM | POA: Insufficient documentation

## 2019-10-29 DIAGNOSIS — R0789 Other chest pain: Secondary | ICD-10-CM | POA: Diagnosis not present

## 2019-10-29 DIAGNOSIS — E119 Type 2 diabetes mellitus without complications: Secondary | ICD-10-CM | POA: Diagnosis not present

## 2019-10-29 DIAGNOSIS — Z01812 Encounter for preprocedural laboratory examination: Secondary | ICD-10-CM

## 2019-10-29 DIAGNOSIS — I10 Essential (primary) hypertension: Secondary | ICD-10-CM | POA: Insufficient documentation

## 2019-10-29 DIAGNOSIS — I358 Other nonrheumatic aortic valve disorders: Secondary | ICD-10-CM | POA: Diagnosis not present

## 2019-10-29 DIAGNOSIS — K219 Gastro-esophageal reflux disease without esophagitis: Secondary | ICD-10-CM | POA: Diagnosis not present

## 2019-10-29 NOTE — Telephone Encounter (Signed)
-----   Message from Katrine Coho, RN sent at 10/29/2019  7:55 AM EST ----- Regarding: FW: [facility] Monday Feb 15 Maybe she could go to [facility] lab on Friday 10/31/19 and have CBC before she goes to get COVID done.  Thanks, Energy manager ----- Message ----- From: Katrine Coho, RN Sent: 10/29/2019   7:40 AM EST To: Katrine Coho, RN, Cain Sieve, RN Subject: [facility] Monday Feb 15                              Hi Prabhjot Piscitello,  Just an [place].  BMP done 10/21/19 prior to [facility] 11/13/19, it does not look like she has had CBC within 30 days of procedure.  CBC/BMP are both needed within 30 days of procedure, unless pt has CKD or anemia, then CBC/BMP should be within 7 days of procedure.  Outpatient Cardiovascular Procedures Guidelines are in the [place] site in the Standard Work and HeartCare P&P tile.  Let me know if you have questions or I can help.  Thanks, Webb Silversmith

## 2019-10-29 NOTE — Progress Notes (Signed)
  Echocardiogram 2D Echocardiogram has been performed.  Makar Slatter G Jubilee Vivero 10/29/2019, 11:58 AM

## 2019-10-29 NOTE — Telephone Encounter (Signed)
-----   Message from Katrine Coho, RN sent at 10/29/2019  7:55 AM EST ----- Regarding: FW: [facility] Monday Feb 15 Maybe she could go to [facility] lab on Friday 10/31/19 and have CBC before she goes to get COVID done.  Thanks, Energy manager ----- Message ----- From: Katrine Coho, RN Sent: 10/29/2019   7:40 AM EST To: Katrine Coho, RN, Cain Sieve, RN Subject: [facility] Monday Feb 15                              Hi Marypat Kimmet,  Just an [place].  BMP done 10/21/19 prior to [facility] 11/13/19, it does not look like she has had CBC within 30 days of procedure.  CBC/BMP are both needed within 30 days of procedure, unless pt has CKD or anemia, then CBC/BMP should be within 7 days of procedure.  Outpatient Cardiovascular Procedures Guidelines are in the [place] site in the Standard Work and HeartCare P&P tile.  Let me know if you have questions or I can help.  Thanks, Webb Silversmith

## 2019-10-29 NOTE — Telephone Encounter (Signed)
Called and put in CBC order - pt verbalized getting blood work done prior to [employer].

## 2019-10-30 ENCOUNTER — Telehealth: Payer: Self-pay | Admitting: *Deleted

## 2019-10-30 LAB — CBC
Hematocrit: 34.3 % (ref 34.0–46.6)
Hemoglobin: 11.2 g/dL (ref 11.1–15.9)
MCH: 26.1 pg — ABNORMAL LOW (ref 26.6–33.0)
MCHC: 32.7 g/dL (ref 31.5–35.7)
MCV: 80 fL (ref 79–97)
Platelets: 329 10*3/uL (ref 150–450)
RBC: 4.29 x10E6/uL (ref 3.77–5.28)
RDW: 14.5 % (ref 11.7–15.4)
WBC: 10.7 10*3/uL (ref 3.4–10.8)

## 2019-10-30 NOTE — Telephone Encounter (Signed)
Pt contacted pre-catheterization scheduled at [facility] [id number] November 03, 2019 9:30 AM Verified arrival time and place: [place] [facility]) at: 7:30 AM   No solid food after midnight prior to cath, clear liquids until 5 AM day of procedure. Contrast allergy: no   Hold: Metformin-day of procedure and 48 hours post procedure. Glipizide-AM of procedure. Telmisartan-HCT-AM of procedure.  Except hold medications AM meds can be  taken pre-cath with sip of water including: ASA 81 mg   Confirmed patient has responsible adult to drive home post procedure and observe 24 hours after arriving home: yes  Currently, due to Covid-19 pandemic, only one person will be allowed with patient. Must be the same person for patient's entire stay and will be required to wear a mask. They will be asked to wait in the waiting room for the duration of the patient's stay.  Patients are required to wear a mask when they enter the hospital.      COVID-19 Pre-Screening Questions:  . In the past 7 to 10 days have you had a cough,  shortness of breath, headache, congestion, fever (100 or greater) body aches, chills, sore throat, or sudden loss of taste or sense of smell? no . Have you been around anyone with known Covid 19 in the past 7-10 days? no . Have you been around anyone who is awaiting Covid 19 test results in the past 7 to 10 days? no . Have you been around anyone who has been exposed to Covid 19, or has mentioned symptoms of Covid 19 within the past 7 to 10 days? no   I reviewed procedure/mask/visitor instructions, COVID-19 screening questions with patient, she verbalized understanding, thanked me for call.

## 2019-10-31 ENCOUNTER — Other Ambulatory Visit (HOSPITAL_COMMUNITY)
Admission: RE | Admit: 2019-10-31 | Discharge: 2019-10-31 | Disposition: A | Discharge status: Home / Self Care | Payer: Medicare HMO | Source: Ambulatory Visit | Attending: Cardiovascular Disease | Admitting: Cardiovascular Disease

## 2019-10-31 DIAGNOSIS — Z20822 Contact with and (suspected) exposure to covid-19: Secondary | ICD-10-CM | POA: Diagnosis not present

## 2019-10-31 DIAGNOSIS — Z01812 Encounter for preprocedural laboratory examination: Secondary | ICD-10-CM | POA: Diagnosis not present

## 2019-10-31 LAB — SARS CORONAVIRUS 2 (TAT 6-24 HRS): SARS Coronavirus 2: NEGATIVE

## 2019-11-03 ENCOUNTER — Other Ambulatory Visit: Payer: Self-pay

## 2019-11-03 ENCOUNTER — Ambulatory Visit (HOSPITAL_COMMUNITY)
Admission: RE | Admit: 2019-11-03 | Discharge: 2019-11-03 | Disposition: A | Discharge status: Home / Self Care | Payer: Medicare HMO | Attending: Cardiovascular Disease | Admitting: Cardiovascular Disease

## 2019-11-03 ENCOUNTER — Encounter (HOSPITAL_COMMUNITY)
Admission: RE | Disposition: A | Discharge status: Home / Self Care | Payer: Self-pay | Source: Home / Self Care | Attending: Cardiovascular Disease

## 2019-11-03 DIAGNOSIS — Z87891 Personal history of nicotine dependence: Secondary | ICD-10-CM | POA: Diagnosis not present

## 2019-11-03 DIAGNOSIS — R296 Repeated falls: Secondary | ICD-10-CM | POA: Diagnosis not present

## 2019-11-03 DIAGNOSIS — Z7984 Long term (current) use of oral hypoglycemic drugs: Secondary | ICD-10-CM | POA: Diagnosis not present

## 2019-11-03 DIAGNOSIS — Z7982 Long term (current) use of aspirin: Secondary | ICD-10-CM | POA: Diagnosis not present

## 2019-11-03 DIAGNOSIS — I2510 Atherosclerotic heart disease of native coronary artery without angina pectoris: Secondary | ICD-10-CM | POA: Diagnosis not present

## 2019-11-03 DIAGNOSIS — Z6834 Body mass index (BMI) 34.0-34.9, adult: Secondary | ICD-10-CM | POA: Insufficient documentation

## 2019-11-03 DIAGNOSIS — Z79899 Other long term (current) drug therapy: Secondary | ICD-10-CM | POA: Diagnosis not present

## 2019-11-03 DIAGNOSIS — Z79890 Hormone replacement therapy (postmenopausal): Secondary | ICD-10-CM | POA: Insufficient documentation

## 2019-11-03 DIAGNOSIS — E663 Overweight: Secondary | ICD-10-CM | POA: Insufficient documentation

## 2019-11-03 DIAGNOSIS — Z8673 Personal history of transient ischemic attack (TIA), and cerebral infarction without residual deficits: Secondary | ICD-10-CM | POA: Diagnosis not present

## 2019-11-03 DIAGNOSIS — R0789 Other chest pain: Secondary | ICD-10-CM

## 2019-11-03 DIAGNOSIS — E119 Type 2 diabetes mellitus without complications: Secondary | ICD-10-CM | POA: Diagnosis not present

## 2019-11-03 DIAGNOSIS — E782 Mixed hyperlipidemia: Secondary | ICD-10-CM | POA: Diagnosis not present

## 2019-11-03 DIAGNOSIS — I10 Essential (primary) hypertension: Secondary | ICD-10-CM | POA: Insufficient documentation

## 2019-11-03 HISTORY — PX: LEFT HEART CATH AND CORONARY ANGIOGRAPHY: CATH118249

## 2019-11-03 LAB — GLUCOSE, CAPILLARY: Glucose-Capillary: 144 mg/dL — ABNORMAL HIGH (ref 70–99)

## 2019-11-03 SURGERY — LEFT HEART CATH AND CORONARY ANGIOGRAPHY
Anesthesia: LOCAL

## 2019-11-03 MED ORDER — MIDAZOLAM HCL 2 MG/2ML IJ SOLN
INTRAMUSCULAR | Status: DC | PRN
  Administered 2019-11-03: 1 mg via INTRAVENOUS

## 2019-11-03 MED ORDER — SODIUM CHLORIDE 0.9 % IV SOLN: 250.0000 mL | INTRAVENOUS | Status: DC | PRN

## 2019-11-03 MED ORDER — FENTANYL CITRATE (PF) 100 MCG/2ML IJ SOLN
INTRAMUSCULAR | Status: DC | PRN
  Administered 2019-11-03: 25 ug via INTRAVENOUS

## 2019-11-03 MED ORDER — MORPHINE SULFATE (PF) 2 MG/ML IV SOLN: 2.0000 mg | INTRAVENOUS | Status: DC | PRN

## 2019-11-03 MED ORDER — FENTANYL CITRATE (PF) 100 MCG/2ML IJ SOLN
INTRAMUSCULAR | Status: AC
  Filled 2019-11-03: qty 2

## 2019-11-03 MED ORDER — LIDOCAINE HCL (PF) 1 % IJ SOLN
INTRAMUSCULAR | Status: DC | PRN
  Administered 2019-11-03: 2 mL

## 2019-11-03 MED ORDER — MIDAZOLAM HCL 2 MG/2ML IJ SOLN
INTRAMUSCULAR | Status: AC
  Filled 2019-11-03: qty 2

## 2019-11-03 MED ORDER — LABETALOL HCL 5 MG/ML IV SOLN: 10.0000 mg | INTRAVENOUS | Status: DC | PRN

## 2019-11-03 MED ORDER — VERAPAMIL HCL 2.5 MG/ML IV SOLN
INTRAVENOUS | Status: AC
  Filled 2019-11-03: qty 2

## 2019-11-03 MED ORDER — NITROGLYCERIN 1 MG/10 ML FOR IR/CATH LAB
INTRA_ARTERIAL | Status: AC
  Filled 2019-11-03: qty 10

## 2019-11-03 MED ORDER — HEPARIN (PORCINE) IN NACL 1000-0.9 UT/500ML-% IV SOLN
INTRAVENOUS | Status: AC
  Filled 2019-11-03: qty 1000

## 2019-11-03 MED ORDER — VERAPAMIL HCL 2.5 MG/ML IV SOLN
INTRA_ARTERIAL | Status: DC | PRN
  Administered 2019-11-03: 15 mL via INTRA_ARTERIAL

## 2019-11-03 MED ORDER — SODIUM CHLORIDE 0.9 % WEIGHT BASED INFUSION
3.0000 mL/kg/h | INTRAVENOUS | Status: AC
  Administered 2019-11-03: 09:00:00 3 mL/kg/h via INTRAVENOUS

## 2019-11-03 MED ORDER — HYDRALAZINE HCL 20 MG/ML IJ SOLN: 10.0000 mg | INTRAMUSCULAR | Status: DC | PRN

## 2019-11-03 MED ORDER — ATORVASTATIN CALCIUM 80 MG PO TABS: 80.0000 mg | ORAL_TABLET | Freq: Every day | ORAL | Status: DC

## 2019-11-03 MED ORDER — HEPARIN SODIUM (PORCINE) 1000 UNIT/ML IJ SOLN
INTRAMUSCULAR | Status: DC | PRN
  Administered 2019-11-03: 4500 [IU] via INTRAVENOUS

## 2019-11-03 MED ORDER — ASPIRIN 81 MG PO CHEW: 81.0000 mg | CHEWABLE_TABLET | ORAL | Status: DC

## 2019-11-03 MED ORDER — ACETAMINOPHEN 325 MG PO TABS: 650.0000 mg | ORAL_TABLET | ORAL | Status: DC | PRN

## 2019-11-03 MED ORDER — SODIUM CHLORIDE 0.9 % IV SOLN: INTRAVENOUS | Status: AC

## 2019-11-03 MED ORDER — HEPARIN SODIUM (PORCINE) 1000 UNIT/ML IJ SOLN
INTRAMUSCULAR | Status: AC
  Filled 2019-11-03: qty 1

## 2019-11-03 MED ORDER — ONDANSETRON HCL 4 MG/2ML IJ SOLN: 4.0000 mg | Freq: Four times a day (QID) | INTRAMUSCULAR | Status: DC | PRN

## 2019-11-03 MED ORDER — SODIUM CHLORIDE 0.9 % WEIGHT BASED INFUSION: 1.0000 mL/kg/h | INTRAVENOUS | Status: DC

## 2019-11-03 MED ORDER — SODIUM CHLORIDE 0.9% FLUSH: 3.0000 mL | INTRAVENOUS | Status: DC | PRN

## 2019-11-03 MED ORDER — IOHEXOL 350 MG/ML SOLN
INTRAVENOUS | Status: DC | PRN
  Administered 2019-11-03: 85 mL via INTRACARDIAC

## 2019-11-03 MED ORDER — SODIUM CHLORIDE 0.9% FLUSH: 3.0000 mL | Freq: Two times a day (BID) | INTRAVENOUS | Status: DC

## 2019-11-03 MED ORDER — HEPARIN (PORCINE) IN NACL 1000-0.9 UT/500ML-% IV SOLN
INTRAVENOUS | Status: DC | PRN
  Administered 2019-11-03 (×2): 500 mL

## 2019-11-03 MED ORDER — ASPIRIN 81 MG PO CHEW: 81.0000 mg | CHEWABLE_TABLET | Freq: Every day | ORAL | Status: DC

## 2019-11-03 MED ORDER — ISOSORBIDE MONONITRATE ER 30 MG PO TB24: 30.0000 mg | ORAL_TABLET | Freq: Every day | ORAL | 1 refills | Status: DC

## 2019-11-03 MED ORDER — LIDOCAINE HCL (PF) 1 % IJ SOLN
INTRAMUSCULAR | Status: AC
  Filled 2019-11-03: qty 30

## 2019-11-03 MED FILL — ISOSORBIDE MN ER 30 MG TAB: 30 | 30 days supply | Qty: 30 | Fill #0

## 2019-11-03 SURGICAL SUPPLY — 12 items
CATH INFINITI 5FR ANG PIGTAIL (CATHETERS) ×2 IMPLANT
CATH OPTITORQUE TIG 4.0 5F (CATHETERS) ×2 IMPLANT
DEVICE RAD COMP TR BAND LRG (VASCULAR PRODUCTS) ×2 IMPLANT
GLIDESHEATH SLEND A-KIT 6F 22G (SHEATH) ×2 IMPLANT
GUIDEWIRE INQWIRE 1.5J.035X260 (WIRE) ×1 IMPLANT
INQWIRE 1.5J .035X260CM (WIRE) ×2
KIT HEART LEFT (KITS) ×2 IMPLANT
PACK CARDIAC CATHETERIZATION (CUSTOM PROCEDURE TRAY) ×2 IMPLANT
SYR MEDRAD MARK 7 150ML (SYRINGE) ×2 IMPLANT
TRANSDUCER W/STOPCOCK (MISCELLANEOUS) ×2 IMPLANT
TUBING CIL FLEX 10 FLL-RA (TUBING) ×2 IMPLANT
WIRE HI TORQ VERSACORE-J 145CM (WIRE) ×2 IMPLANT

## 2019-11-03 NOTE — Interval H&P Note (Signed)
Cath Lab Visit (complete for each Cath Lab visit)  Clinical Evaluation Leading to the Procedure:   ACS: No.  Non-ACS:    Anginal Classification: CCS II  Anti-ischemic medical therapy: Minimal Therapy (1 class of medications)  Non-Invasive Test Results: No non-invasive testing performed  Prior CABG: No previous CABG      History and Physical Interval Note:  11/03/2019 9:09 AM  [name]  has presented today for surgery, with the diagnosis of cad.  The various methods of treatment have been discussed with the patient and family. After consideration of risks, benefits and other options for treatment, the patient has consented to  Procedure(s): LEFT HEART CATH AND CORONARY ANGIOGRAPHY (N/A) as a surgical intervention.  The patient's history has been reviewed, patient examined, no change in status, stable for surgery.  I have reviewed the patient's chart and labs.  Questions were answered to the patient's satisfaction.     Quay Burow

## 2019-11-03 NOTE — Discharge Instructions (Signed)
Radial Site Care  This sheet gives you information about how to care for yourself after your procedure. Your health care provider may also give you more specific instructions. If you have problems or questions, contact your health care provider. What can I expect after the procedure? After the procedure, it is common to have:  Bruising and tenderness at the catheter insertion area. Follow these instructions at home: Medicines  Take over-the-counter and prescription medicines only as told by your health care provider. Insertion site care  Follow instructions from your health care provider about how to take care of your insertion site. Make sure you: ? Wash your hands with soap and water before you change your bandage (dressing). If soap and water are not available, use hand sanitizer. ? Change your dressing as told by your health care provider. ? Leave stitches (sutures), skin glue, or adhesive strips in place. These skin closures may need to stay in place for 2 weeks or longer. If adhesive strip edges start to loosen and curl up, you may trim the loose edges. Do not remove adhesive strips completely unless your health care provider tells you to do that.  Check your insertion site every day for signs of infection. Check for: ? Redness, swelling, or pain. ? Fluid or blood. ? Pus or a bad smell. ? Warmth.  Do not take baths, swim, or use a hot tub until your health care provider approves.  You may shower 24-48 hours after the procedure, or as directed by your health care provider. ? Remove the dressing and gently wash the site with plain soap and water. ? Pat the area dry with a clean towel. ? Do not rub the site. That could cause bleeding.  Do not apply powder or lotion to the site. Activity   For 24 hours after the procedure, or as directed by your health care provider: ? Do not flex or bend the affected arm. ? Do not push or pull heavy objects with the affected arm. ? Do not  drive yourself home from the hospital or clinic. You may drive 24 hours after the procedure unless your health care provider tells you not to. ? Do not operate machinery or power tools.  Do not lift anything that is heavier than 10 lb (4.5 kg), or the limit that you are told, until your health care provider says that it is safe.  Ask your health care provider when it is okay to: ? Return to work or school. ? Resume usual physical activities or sports. ? Resume sexual activity. General instructions  If the catheter site starts to bleed, raise your arm and put firm pressure on the site. If the bleeding does not stop, get help right away. This is a medical emergency.  If you went home on the same day as your procedure, a responsible adult should be with you for the first 24 hours after you arrive home.  Keep all follow-up visits as told by your health care provider. This is important. Contact a health care provider if:  You have a fever.  You have redness, swelling, or yellow drainage around your insertion site. Get help right away if:  You have unusual pain at the radial site.  The catheter insertion area swells very fast.  The insertion area is bleeding, and the bleeding does not stop when you hold steady pressure on the area.  Your arm or hand becomes pale, cool, tingly, or numb. These symptoms may represent a serious problem   that is an emergency. Do not wait to see if the symptoms will go away. Get medical help right away. Call your local emergency services (911 in the [place] Do not drive yourself to the hospital. Summary  After the procedure, it is common to have bruising and tenderness at the site.  Follow instructions from your health care provider about how to take care of your radial site wound. Check the wound every day for signs of infection.  Do not lift anything that is heavier than 10 lb (4.5 kg), or the limit that you are told, until your health care provider says  that it is safe. This information is not intended to replace advice given to you by your health care provider. Make sure you discuss any questions you have with your health care provider. Document Revised: 10/10/2017 Document Reviewed: 10/10/2017 Elsevier Patient Education  2020 Elsevier Inc.  

## 2019-11-03 NOTE — Progress Notes (Signed)
Resting without complaints, R hand elevated, R hand neurovascular status intact;  3 cc removed from the TR band, no hematoma or bleeding noted, Iv fluids infusing- monitoring.

## 2019-11-06 ENCOUNTER — Ambulatory Visit: Payer: Medicare HMO | Admitting: Cardiovascular Disease

## 2019-11-13 DIAGNOSIS — S76112D Strain of left quadriceps muscle, fascia and tendon, subsequent encounter: Secondary | ICD-10-CM | POA: Diagnosis not present

## 2019-11-13 DIAGNOSIS — M65342 Trigger finger, left ring finger: Secondary | ICD-10-CM | POA: Diagnosis not present

## 2019-11-17 DIAGNOSIS — M79605 Pain in left leg: Secondary | ICD-10-CM | POA: Diagnosis not present

## 2019-11-17 DIAGNOSIS — S76112D Strain of left quadriceps muscle, fascia and tendon, subsequent encounter: Secondary | ICD-10-CM | POA: Diagnosis not present

## 2019-11-18 DIAGNOSIS — R69 Illness, unspecified: Secondary | ICD-10-CM | POA: Diagnosis not present

## 2019-11-19 ENCOUNTER — Other Ambulatory Visit: Payer: Self-pay

## 2019-11-19 ENCOUNTER — Ambulatory Visit: Payer: Medicare HMO | Attending: Internal Medicine

## 2019-11-19 ENCOUNTER — Ambulatory Visit: Payer: Medicare HMO | Admitting: Cardiovascular Disease

## 2019-11-19 ENCOUNTER — Encounter: Payer: Self-pay | Admitting: Cardiovascular Disease

## 2019-11-19 DIAGNOSIS — R0789 Other chest pain: Secondary | ICD-10-CM

## 2019-11-19 DIAGNOSIS — E7800 Pure hypercholesterolemia, unspecified: Secondary | ICD-10-CM | POA: Diagnosis not present

## 2019-11-19 DIAGNOSIS — F17200 Nicotine dependence, unspecified, uncomplicated: Secondary | ICD-10-CM

## 2019-11-19 DIAGNOSIS — I10 Essential (primary) hypertension: Secondary | ICD-10-CM | POA: Diagnosis not present

## 2019-11-19 DIAGNOSIS — Z23 Encounter for immunization: Secondary | ICD-10-CM | POA: Insufficient documentation

## 2019-11-19 DIAGNOSIS — R69 Illness, unspecified: Secondary | ICD-10-CM | POA: Diagnosis not present

## 2019-11-19 MED ORDER — ISOSORBIDE MONONITRATE ER 30 MG PO TB24: 30.0000 mg | ORAL_TABLET | Freq: Every day | ORAL | 3 refills | Status: DC

## 2019-11-19 MED ORDER — ATORVASTATIN CALCIUM 40 MG PO TABS: 40.0000 mg | ORAL_TABLET | Freq: Every day | ORAL | 3 refills | Status: DC

## 2019-11-19 NOTE — Assessment & Plan Note (Signed)
History of hyperlipidemia on atorvastatin 20 mg which she was taking sporadically up until recently.  Her most recent lipid profile performed 09/08/2019 revealed total cholesterol 188, LDL of 123 and HDL 41.  I am going to increase her atorvastatin statin from 20 to 40 mg a day and we will will recheck a lipid liver profile in 2 months.

## 2019-11-19 NOTE — Assessment & Plan Note (Signed)
History of essential hypertension with blood pressure measured today 126/64.  She is on Micardis hydrochlorothiazide.

## 2019-11-19 NOTE — Patient Instructions (Signed)
Medication Instructions:  Increase Atorvastatin to 40mg  Daily  If you need a refill on your cardiac medications before your next appointment, please call your pharmacy.   Lab work: Fasting Lipids and Hepatic Function in 2 months If you have labs (blood work) drawn today and your tests are completely normal, you will receive your results only by: [place] (if you have MyChart) OR A paper copy in the mail If you have any lab test that is abnormal or we need to change your treatment, we will call you to review the results.  Testing/Procedures: NONE  Follow-Up: At [facility], you and your health needs are our priority.  As part of our continuing mission to provide you with exceptional heart care, we have created designated Provider Care Teams.  These Care Teams include your primary Cardiologist (physician) and Advanced Practice Providers (APPs -  Physician Assistants and Nurse Practitioners) who all work together to provide you with the care you need, when you need it. You may see Dr. Gwenlyn Found or one of the following Advanced Practice Providers on your designated Care Team:    Kerin Ransom, PA-C  [place], [place]  Coletta Memos, [place]  Your physician wants you to follow-up in: 4 months with Dr. Gwenlyn Found

## 2019-11-19 NOTE — Assessment & Plan Note (Signed)
History of atypical chest chest pain with recent outpatient radial diagnostic cath 11/03/2019 revealing three-vessel disease with mid and distal LAD disease, distal obtuse marginal branch disease and nondominant RCA disease.  She had normal LV function.  The plan was to treat her medically and reserve revascularization for recalcitrant symptoms.  After beginning Imdur her chest pain has significantly improved.
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
## 2019-11-19 NOTE — Assessment & Plan Note (Signed)
Tobacco abuse was discontinued August of last year

## 2019-11-19 NOTE — Progress Notes (Signed)
   Covid-19 Vaccination Clinic  Name:  [name]    MRN: [id number] DOB: 11/23/1950  11/19/2019  [name] was observed post Covid-19 immunization for 30 minutes based on pre-vaccination screening without incident. She was provided with Vaccine Information Sheet and instruction to access the V-Safe system.   [name] was instructed to call 911 with any severe reactions post vaccine: Marland Kitchen Difficulty breathing  . Swelling of face and throat  . A fast heartbeat  . A bad rash all over body  . Dizziness and weakness   Immunizations Administered    Name Date Dose VIS Date Route   Pfizer COVID-19 Vaccine 11/19/2019  4:04 PM 0.3 mL 08/29/2019 Intramuscular   Manufacturer: [place]   Lot: [id number]   [place]: [id number]

## 2019-11-24 MED FILL — traMADol HCL 50 MG TABS: 50 | 30 days supply | Qty: 60 | Fill #0

## 2019-11-24 MED FILL — tiZANidine HCL 2 MG TABS: 2 | 10 days supply | Qty: 30 | Fill #0

## 2019-11-25 DIAGNOSIS — M79605 Pain in left leg: Secondary | ICD-10-CM | POA: Diagnosis not present

## 2019-11-25 DIAGNOSIS — S76112D Strain of left quadriceps muscle, fascia and tendon, subsequent encounter: Secondary | ICD-10-CM | POA: Diagnosis not present

## 2019-11-26 MED FILL — ISOSORBIDE MN ER 30 MG TAB: 30 | 30 days supply | Qty: 30 | Fill #1

## 2019-12-23 MED FILL — ISOSORBIDE MN ER 30 MG TAB: 30 | 90 days supply | Qty: 90 | Fill #0

## 2019-12-23 MED FILL — ATORVASTATIN 40 MG TABLET: 40 | 90 days supply | Qty: 90 | Fill #0

## 2019-12-24 DIAGNOSIS — R69 Illness, unspecified: Secondary | ICD-10-CM | POA: Diagnosis not present

## 2019-12-24 MED FILL — tiZANidine HCL 2 MG TABS: 2 | 10 days supply | Qty: 30 | Fill #0

## 2019-12-26 MED FILL — TELMISARTAN-HCTZ 80-25 MG T: 80-25 | 90 days supply | Qty: 90 | Fill #0

## 2019-12-26 MED FILL — traMADol HCL 50 MG TABS: 50 | 30 days supply | Qty: 60 | Fill #0

## 2020-01-12 ENCOUNTER — Telehealth: Payer: Self-pay | Admitting: Cardiovascular Disease

## 2020-01-12 NOTE — Telephone Encounter (Signed)
New Message     Pt is calling to update her pharmacy  She says [facility] is now using [facility]     Please advise

## 2020-01-12 NOTE — Telephone Encounter (Signed)
The pharmacy had already been updated in the chart. Message left with the patient to let her know.

## 2020-01-15 DIAGNOSIS — M25561 Pain in right knee: Secondary | ICD-10-CM | POA: Diagnosis not present

## 2020-01-15 DIAGNOSIS — M5441 Lumbago with sciatica, right side: Secondary | ICD-10-CM | POA: Diagnosis not present

## 2020-01-15 DIAGNOSIS — M25562 Pain in left knee: Secondary | ICD-10-CM | POA: Diagnosis not present

## 2020-01-15 DIAGNOSIS — M7061 Trochanteric bursitis, right hip: Secondary | ICD-10-CM | POA: Diagnosis not present

## 2020-01-15 DIAGNOSIS — M545 Low back pain: Secondary | ICD-10-CM | POA: Diagnosis not present

## 2020-01-15 DIAGNOSIS — M25551 Pain in right hip: Secondary | ICD-10-CM | POA: Diagnosis not present

## 2020-01-30 DIAGNOSIS — M5416 Radiculopathy, lumbar region: Secondary | ICD-10-CM | POA: Diagnosis not present

## 2020-02-23 ENCOUNTER — Other Ambulatory Visit: Payer: Self-pay | Admitting: Cardiovascular Disease

## 2020-02-23 NOTE — Telephone Encounter (Signed)
°*  STAT* If patient is at the pharmacy, call can be transferred to refill team.   1. Which medications need to be refilled? (please list name of each medication and dose if known)   atorvastatin (LIPITOR) 40 MG tablet   isosorbide mononitrate (IMDUR) 30 MG 24 hr tablet(Expired)  2. Which pharmacy/location (including street and city if local pharmacy) is medication to be sent to?   [phone], [facility] [address] [place], [address]  3. Do they need a 30 day or 90 day supply? [place]

## 2020-02-24 MED ORDER — ISOSORBIDE MONONITRATE ER 30 MG PO TB24: 30.0000 mg | ORAL_TABLET | Freq: Every day | ORAL | 3 refills | Status: DC

## 2020-02-24 MED ORDER — ATORVASTATIN CALCIUM 40 MG PO TABS: 40.0000 mg | ORAL_TABLET | Freq: Every day | ORAL | 3 refills | Status: DC

## 2020-02-25 DIAGNOSIS — E7800 Pure hypercholesterolemia, unspecified: Secondary | ICD-10-CM | POA: Diagnosis not present

## 2020-02-25 DIAGNOSIS — G459 Transient cerebral ischemic attack, unspecified: Secondary | ICD-10-CM | POA: Diagnosis not present

## 2020-02-25 DIAGNOSIS — R69 Illness, unspecified: Secondary | ICD-10-CM | POA: Diagnosis not present

## 2020-02-25 DIAGNOSIS — E1169 Type 2 diabetes mellitus with other specified complication: Secondary | ICD-10-CM | POA: Diagnosis not present

## 2020-02-25 DIAGNOSIS — I2510 Atherosclerotic heart disease of native coronary artery without angina pectoris: Secondary | ICD-10-CM | POA: Diagnosis not present

## 2020-02-25 DIAGNOSIS — E785 Hyperlipidemia, unspecified: Secondary | ICD-10-CM | POA: Diagnosis not present

## 2020-02-25 DIAGNOSIS — I10 Essential (primary) hypertension: Secondary | ICD-10-CM | POA: Diagnosis not present

## 2020-02-25 DIAGNOSIS — E1165 Type 2 diabetes mellitus with hyperglycemia: Secondary | ICD-10-CM | POA: Diagnosis not present

## 2020-02-25 DIAGNOSIS — E039 Hypothyroidism, unspecified: Secondary | ICD-10-CM | POA: Diagnosis not present

## 2020-02-27 DIAGNOSIS — R69 Illness, unspecified: Secondary | ICD-10-CM | POA: Diagnosis not present

## 2020-02-27 DIAGNOSIS — E7800 Pure hypercholesterolemia, unspecified: Secondary | ICD-10-CM | POA: Diagnosis not present

## 2020-02-27 LAB — LIPID PANEL
Chol/HDL Ratio: 4 ratio (ref 0.0–4.4)
Cholesterol, Total: 141 mg/dL (ref 100–199)
HDL: 35 mg/dL — ABNORMAL LOW (ref >39)
LDL Chol Calc (NIH): 75 mg/dL (ref 0–99)
Triglycerides: 184 mg/dL — ABNORMAL HIGH (ref 0–149)
VLDL Cholesterol Cal: 31 mg/dL (ref 5–40)

## 2020-02-27 LAB — HEPATIC FUNCTION PANEL
ALT: 12 IU/L (ref 0–32)
AST: 15 IU/L (ref 0–40)
Albumin: 4.4 g/dL (ref 3.8–4.8)
Alkaline Phosphatase: 78 IU/L (ref 48–121)
Bilirubin Total: 0.4 mg/dL (ref 0.0–1.2)
Bilirubin, Direct: 0.1 mg/dL (ref 0.00–0.40)
Total Protein: 7.5 g/dL (ref 6.0–8.5)

## 2020-03-09 DIAGNOSIS — E7800 Pure hypercholesterolemia, unspecified: Secondary | ICD-10-CM | POA: Diagnosis not present

## 2020-03-09 DIAGNOSIS — I10 Essential (primary) hypertension: Secondary | ICD-10-CM | POA: Diagnosis not present

## 2020-03-09 DIAGNOSIS — R69 Illness, unspecified: Secondary | ICD-10-CM | POA: Diagnosis not present

## 2020-03-09 DIAGNOSIS — Z23 Encounter for immunization: Secondary | ICD-10-CM | POA: Diagnosis not present

## 2020-03-09 DIAGNOSIS — M5440 Lumbago with sciatica, unspecified side: Secondary | ICD-10-CM | POA: Diagnosis not present

## 2020-03-09 DIAGNOSIS — I2510 Atherosclerotic heart disease of native coronary artery without angina pectoris: Secondary | ICD-10-CM | POA: Diagnosis not present

## 2020-03-09 DIAGNOSIS — E039 Hypothyroidism, unspecified: Secondary | ICD-10-CM | POA: Diagnosis not present

## 2020-03-09 DIAGNOSIS — E1169 Type 2 diabetes mellitus with other specified complication: Secondary | ICD-10-CM | POA: Diagnosis not present

## 2020-03-19 ENCOUNTER — Encounter: Payer: Self-pay | Admitting: Cardiovascular Disease

## 2020-03-19 ENCOUNTER — Other Ambulatory Visit: Payer: Self-pay

## 2020-03-19 ENCOUNTER — Ambulatory Visit: Payer: Medicare HMO | Admitting: Cardiovascular Disease

## 2020-03-19 VITALS — BP 128/72 | HR 76 | Temp 97.2°F | Ht 65.0 in | Wt 216.4 lb

## 2020-03-19 DIAGNOSIS — R0789 Other chest pain: Secondary | ICD-10-CM | POA: Diagnosis not present

## 2020-03-19 DIAGNOSIS — E7800 Pure hypercholesterolemia, unspecified: Secondary | ICD-10-CM | POA: Diagnosis not present

## 2020-03-19 DIAGNOSIS — I10 Essential (primary) hypertension: Secondary | ICD-10-CM
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
## 2020-03-19 NOTE — Assessment & Plan Note (Signed)
History of hyperlipidemia on statin therapy with lipid profile performed 02/27/2020 revealing total cholesterol 141, LDL 75 and HDL 35.

## 2020-03-19 NOTE — Assessment & Plan Note (Signed)
History of atypical chest pain with a positive coronary CTA which led to a cardiac catheterization which I performed radially 11/03/2019 revealing left dominant system with high-grade calcified disease in the mid and apical LAD as well as in the distal obtuse marginal branch and in the nondominant RCA with normal LV function.  I did not think these were optimal for percutaneous revascularization but opted for medical therapy initially.  I placed her on Imdur.  She is had no further chest pain.

## 2020-03-19 NOTE — Assessment & Plan Note (Signed)
History of essential hypertension a blood pressure measured today 128/72.  She is on Micardis and hydrochlorothiazide.

## 2020-03-19 NOTE — Patient Instructions (Signed)
Medication Instructions:  NO CHANGE *If you need a refill on your cardiac medications before your next appointment, please call your pharmacy*   Lab Work: If you have labs (blood work) drawn today and your tests are completely normal, you will receive your results only by: . MyChart Message (if you have MyChart) OR . A paper copy in the mail If you have any lab test that is abnormal or we need to change your treatment, we will call you to review the results.   Follow-Up: At [facility], you and your health needs are our priority.  As part of our continuing mission to provide you with exceptional heart care, we have created designated Provider Care Teams.  These Care Teams include your primary Cardiologist (physician) and Advanced Practice Providers (APPs -  Physician Assistants and Nurse Practitioners) who all work together to provide you with the care you need, when you need it.  We recommend signing up for the patient portal called "MyChart".  Sign up information is provided on this After Visit Summary.  MyChart is used to connect with patients for Virtual Visits (Telemedicine).  Patients are able to view lab/test results, encounter notes, upcoming appointments, etc.  Non-urgent messages can be sent to your provider as well.   To learn more about what you can do with MyChart, go to [email or web address].    Your next appointment:   12 month(s)  The format for your next appointment:   In Person  Provider:   Jonathan Berry, MD    

## 2020-03-23 DIAGNOSIS — M5416 Radiculopathy, lumbar region: Secondary | ICD-10-CM | POA: Diagnosis not present

## 2020-03-30 DIAGNOSIS — R69 Illness, unspecified: Secondary | ICD-10-CM | POA: Diagnosis not present

## 2020-03-30 DIAGNOSIS — E1142 Type 2 diabetes mellitus with diabetic polyneuropathy: Secondary | ICD-10-CM | POA: Diagnosis not present

## 2020-03-30 DIAGNOSIS — E039 Hypothyroidism, unspecified: Secondary | ICD-10-CM | POA: Diagnosis not present

## 2020-03-30 DIAGNOSIS — G459 Transient cerebral ischemic attack, unspecified: Secondary | ICD-10-CM | POA: Diagnosis not present

## 2020-03-30 DIAGNOSIS — E1165 Type 2 diabetes mellitus with hyperglycemia: Secondary | ICD-10-CM | POA: Diagnosis not present

## 2020-03-30 DIAGNOSIS — E119 Type 2 diabetes mellitus without complications: Secondary | ICD-10-CM | POA: Diagnosis not present

## 2020-03-30 DIAGNOSIS — M47816 Spondylosis without myelopathy or radiculopathy, lumbar region: Secondary | ICD-10-CM | POA: Diagnosis not present

## 2020-03-30 DIAGNOSIS — I10 Essential (primary) hypertension: Secondary | ICD-10-CM | POA: Diagnosis not present

## 2020-03-30 DIAGNOSIS — E7800 Pure hypercholesterolemia, unspecified: Secondary | ICD-10-CM | POA: Diagnosis not present

## 2020-04-12 DIAGNOSIS — M5416 Radiculopathy, lumbar region: Secondary | ICD-10-CM | POA: Diagnosis not present

## 2020-04-22 DIAGNOSIS — R05 Cough: Secondary | ICD-10-CM | POA: Diagnosis not present

## 2020-04-23 DIAGNOSIS — Z20828 Contact with and (suspected) exposure to other viral communicable diseases: Secondary | ICD-10-CM | POA: Diagnosis not present

## 2020-05-05 DIAGNOSIS — R69 Illness, unspecified: Secondary | ICD-10-CM | POA: Diagnosis not present

## 2020-05-11 DIAGNOSIS — M5416 Radiculopathy, lumbar region: Secondary | ICD-10-CM | POA: Diagnosis not present

## 2020-06-01 DIAGNOSIS — M5416 Radiculopathy, lumbar region: Secondary | ICD-10-CM | POA: Diagnosis not present

## 2020-06-16 DIAGNOSIS — E7800 Pure hypercholesterolemia, unspecified: Secondary | ICD-10-CM | POA: Diagnosis not present

## 2020-06-16 DIAGNOSIS — E1165 Type 2 diabetes mellitus with hyperglycemia: Secondary | ICD-10-CM | POA: Diagnosis not present

## 2020-06-16 DIAGNOSIS — M47816 Spondylosis without myelopathy or radiculopathy, lumbar region: Secondary | ICD-10-CM | POA: Diagnosis not present

## 2020-06-16 DIAGNOSIS — E1142 Type 2 diabetes mellitus with diabetic polyneuropathy: Secondary | ICD-10-CM | POA: Diagnosis not present

## 2020-06-16 DIAGNOSIS — G459 Transient cerebral ischemic attack, unspecified: Secondary | ICD-10-CM | POA: Diagnosis not present

## 2020-06-16 DIAGNOSIS — E039 Hypothyroidism, unspecified: Secondary | ICD-10-CM | POA: Diagnosis not present

## 2020-06-16 DIAGNOSIS — E785 Hyperlipidemia, unspecified: Secondary | ICD-10-CM | POA: Diagnosis not present

## 2020-06-16 DIAGNOSIS — I10 Essential (primary) hypertension: Secondary | ICD-10-CM | POA: Diagnosis not present

## 2020-06-16 DIAGNOSIS — R69 Illness, unspecified: Secondary | ICD-10-CM | POA: Diagnosis not present

## 2020-07-08 DIAGNOSIS — R69 Illness, unspecified: Secondary | ICD-10-CM | POA: Diagnosis not present

## 2020-07-22 DIAGNOSIS — E7800 Pure hypercholesterolemia, unspecified: Secondary | ICD-10-CM | POA: Diagnosis not present

## 2020-07-22 DIAGNOSIS — G459 Transient cerebral ischemic attack, unspecified: Secondary | ICD-10-CM | POA: Diagnosis not present

## 2020-07-22 DIAGNOSIS — E039 Hypothyroidism, unspecified: Secondary | ICD-10-CM | POA: Diagnosis not present

## 2020-07-22 DIAGNOSIS — E1142 Type 2 diabetes mellitus with diabetic polyneuropathy: Secondary | ICD-10-CM | POA: Diagnosis not present

## 2020-07-22 DIAGNOSIS — M47816 Spondylosis without myelopathy or radiculopathy, lumbar region: Secondary | ICD-10-CM | POA: Diagnosis not present

## 2020-07-22 DIAGNOSIS — R69 Illness, unspecified: Secondary | ICD-10-CM | POA: Diagnosis not present

## 2020-07-22 DIAGNOSIS — E119 Type 2 diabetes mellitus without complications: Secondary | ICD-10-CM | POA: Diagnosis not present

## 2020-07-22 DIAGNOSIS — E785 Hyperlipidemia, unspecified: Secondary | ICD-10-CM | POA: Diagnosis not present

## 2020-07-22 DIAGNOSIS — I10 Essential (primary) hypertension: Secondary | ICD-10-CM | POA: Diagnosis not present

## 2020-07-28 ENCOUNTER — Other Ambulatory Visit: Payer: Self-pay | Admitting: Obstetrics and Gynecology

## 2020-07-28 DIAGNOSIS — N6310 Unspecified lump in the right breast, unspecified quadrant: Secondary | ICD-10-CM

## 2020-08-09 DIAGNOSIS — H52201 Unspecified astigmatism, right eye: Secondary | ICD-10-CM | POA: Diagnosis not present

## 2020-08-09 DIAGNOSIS — E119 Type 2 diabetes mellitus without complications: Secondary | ICD-10-CM | POA: Diagnosis not present

## 2020-08-09 DIAGNOSIS — H524 Presbyopia: Secondary | ICD-10-CM | POA: Diagnosis not present

## 2020-08-09 DIAGNOSIS — H33301 Unspecified retinal break, right eye: Secondary | ICD-10-CM | POA: Diagnosis not present

## 2020-08-17 DIAGNOSIS — M7062 Trochanteric bursitis, left hip: Secondary | ICD-10-CM | POA: Diagnosis not present

## 2020-08-17 DIAGNOSIS — M5442 Lumbago with sciatica, left side: Secondary | ICD-10-CM | POA: Diagnosis not present

## 2020-08-17 DIAGNOSIS — M25552 Pain in left hip: Secondary | ICD-10-CM | POA: Diagnosis not present

## 2020-08-17 DIAGNOSIS — M545 Low back pain, unspecified: Secondary | ICD-10-CM | POA: Diagnosis not present

## 2020-08-18 DIAGNOSIS — I2510 Atherosclerotic heart disease of native coronary artery without angina pectoris: Secondary | ICD-10-CM | POA: Diagnosis not present

## 2020-08-18 DIAGNOSIS — E1165 Type 2 diabetes mellitus with hyperglycemia: Secondary | ICD-10-CM | POA: Diagnosis not present

## 2020-08-18 DIAGNOSIS — I10 Essential (primary) hypertension: Secondary | ICD-10-CM | POA: Diagnosis not present

## 2020-08-18 DIAGNOSIS — E7800 Pure hypercholesterolemia, unspecified: Secondary | ICD-10-CM | POA: Diagnosis not present

## 2020-08-18 DIAGNOSIS — M5440 Lumbago with sciatica, unspecified side: Secondary | ICD-10-CM | POA: Diagnosis not present

## 2020-08-18 DIAGNOSIS — R69 Illness, unspecified: Secondary | ICD-10-CM | POA: Diagnosis not present

## 2020-08-18 DIAGNOSIS — E039 Hypothyroidism, unspecified: Secondary | ICD-10-CM | POA: Diagnosis not present

## 2020-08-26 ENCOUNTER — Other Ambulatory Visit: Payer: Medicare HMO

## 2020-09-03 DIAGNOSIS — Z6839 Body mass index (BMI) 39.0-39.9, adult: Secondary | ICD-10-CM | POA: Diagnosis not present

## 2020-09-03 DIAGNOSIS — Z124 Encounter for screening for malignant neoplasm of cervix: Secondary | ICD-10-CM | POA: Diagnosis not present

## 2020-09-03 DIAGNOSIS — N898 Other specified noninflammatory disorders of vagina: Secondary | ICD-10-CM | POA: Diagnosis not present

## 2020-09-03 DIAGNOSIS — Z01419 Encounter for gynecological examination (general) (routine) without abnormal findings: Secondary | ICD-10-CM | POA: Diagnosis not present

## 2020-09-03 DIAGNOSIS — R399 Unspecified symptoms and signs involving the genitourinary system: Secondary | ICD-10-CM | POA: Diagnosis not present

## 2020-09-06 ENCOUNTER — Ambulatory Visit
Admission: RE | Admit: 2020-09-06 | Discharge: 2020-09-06 | Disposition: A | Discharge status: Home / Self Care | Payer: Medicare HMO | Source: Ambulatory Visit | Attending: Obstetrics and Gynecology | Admitting: Obstetrics and Gynecology

## 2020-09-06 ENCOUNTER — Other Ambulatory Visit: Payer: Self-pay

## 2020-09-06 DIAGNOSIS — N6310 Unspecified lump in the right breast, unspecified quadrant: Secondary | ICD-10-CM

## 2020-09-06 DIAGNOSIS — R922 Inconclusive mammogram: Secondary | ICD-10-CM | POA: Diagnosis not present

## 2020-09-06 DIAGNOSIS — N6313 Unspecified lump in the right breast, lower outer quadrant: Secondary | ICD-10-CM | POA: Diagnosis not present

## 2020-09-06 DIAGNOSIS — N6311 Unspecified lump in the right breast, upper outer quadrant: Secondary | ICD-10-CM | POA: Diagnosis not present

## 2020-09-12 DIAGNOSIS — Z20822 Contact with and (suspected) exposure to covid-19: Secondary | ICD-10-CM | POA: Diagnosis not present

## 2020-09-14 ENCOUNTER — Telehealth: Payer: Self-pay | Admitting: Family Medicine

## 2020-09-14 NOTE — Telephone Encounter (Signed)
[facility] called for 2015 Medical records release.  Sent 2014 records to [facility] fax [phone] as they are reviewing a medication change for Synthroid/Levothyroxine.

## 2020-10-11 ENCOUNTER — Telehealth: Payer: Self-pay | Admitting: Cardiovascular Disease

## 2020-10-11 NOTE — Telephone Encounter (Signed)
*  STAT* If patient is at the pharmacy, call can be transferred to refill team.   1. Which medications need to be refilled? (please list name of each medication and dose if known)  isosorbide mononitrate (IMDUR) 30 MG 24 hr tablet(Expired) atorvastatin (LIPITOR) 40 MG tablet  2. Which pharmacy/location (including street and city if local pharmacy) is medication to be sent to? [facility] [place] [place] AT Portal to [facility]  3. Do they need a 30 day or 90 day supply?  90 day supply

## 2020-10-12 MED ORDER — ATORVASTATIN CALCIUM 40 MG PO TABS: 40.0000 mg | ORAL_TABLET | Freq: Every day | ORAL | 1 refills | Status: DC

## 2020-10-12 MED ORDER — ISOSORBIDE MONONITRATE ER 30 MG PO TB24: 30.0000 mg | ORAL_TABLET | Freq: Every day | ORAL | 1 refills | Status: DC

## 2020-10-12 NOTE — Telephone Encounter (Signed)
Rx has been sent to the pharmacy electronically. ° °

## 2020-10-14 DIAGNOSIS — R69 Illness, unspecified: Secondary | ICD-10-CM | POA: Diagnosis not present

## 2020-10-14 DIAGNOSIS — G459 Transient cerebral ischemic attack, unspecified: Secondary | ICD-10-CM | POA: Diagnosis not present

## 2020-10-14 DIAGNOSIS — E7800 Pure hypercholesterolemia, unspecified: Secondary | ICD-10-CM | POA: Diagnosis not present

## 2020-10-14 DIAGNOSIS — E1165 Type 2 diabetes mellitus with hyperglycemia: Secondary | ICD-10-CM | POA: Diagnosis not present

## 2020-10-14 DIAGNOSIS — K219 Gastro-esophageal reflux disease without esophagitis: Secondary | ICD-10-CM | POA: Diagnosis not present

## 2020-10-14 DIAGNOSIS — E1142 Type 2 diabetes mellitus with diabetic polyneuropathy: Secondary | ICD-10-CM | POA: Diagnosis not present

## 2020-10-14 DIAGNOSIS — E039 Hypothyroidism, unspecified: Secondary | ICD-10-CM | POA: Diagnosis not present

## 2020-10-14 DIAGNOSIS — E119 Type 2 diabetes mellitus without complications: Secondary | ICD-10-CM | POA: Diagnosis not present

## 2020-10-14 DIAGNOSIS — E785 Hyperlipidemia, unspecified: Secondary | ICD-10-CM | POA: Diagnosis not present

## 2020-10-14 DIAGNOSIS — I10 Essential (primary) hypertension: Secondary | ICD-10-CM | POA: Diagnosis not present

## 2020-10-18 ENCOUNTER — Telehealth: Payer: Self-pay | Admitting: Cardiovascular Disease

## 2020-10-18 MED ORDER — ATORVASTATIN CALCIUM 80 MG PO TABS: 80.0000 mg | ORAL_TABLET | Freq: Every day | ORAL | 3 refills | Status: DC

## 2020-10-18 NOTE — Telephone Encounter (Signed)
ref# [id number]  Spoke with Myra and Ray at [facility], per Tiffany at Dr. Radene Ou office. Pharm D increased atorvastatin to 80mg  daily on 10/12/2020.

## 2020-10-18 NOTE — Telephone Encounter (Signed)
° ° °  Pt c/o medication issue:  1. Name of Medication:   atorvastatin (LIPITOR) 40 MG tablet    2. How are you currently taking this medication (dosage and times per day)? Take 1 tablet (40 mg total) by mouth at bedtime.  3. Are you having a reaction (difficulty breathing--STAT)?   4. What is your medication issue? Kayla [facility] calling to verify dosage of this medication. She said they received 2 refill of atorvastatin one for 80 mg and one for 40 mg. She wanted to know which ones they need to refill for pt. She gave ref# [id number]

## 2020-11-08 DIAGNOSIS — E1142 Type 2 diabetes mellitus with diabetic polyneuropathy: Secondary | ICD-10-CM | POA: Diagnosis not present

## 2020-11-08 DIAGNOSIS — R69 Illness, unspecified: Secondary | ICD-10-CM | POA: Diagnosis not present

## 2020-11-08 DIAGNOSIS — K219 Gastro-esophageal reflux disease without esophagitis: Secondary | ICD-10-CM | POA: Diagnosis not present

## 2020-11-08 DIAGNOSIS — E785 Hyperlipidemia, unspecified: Secondary | ICD-10-CM | POA: Diagnosis not present

## 2020-11-08 DIAGNOSIS — E119 Type 2 diabetes mellitus without complications: Secondary | ICD-10-CM | POA: Diagnosis not present

## 2020-11-08 DIAGNOSIS — E7800 Pure hypercholesterolemia, unspecified: Secondary | ICD-10-CM | POA: Diagnosis not present

## 2020-11-08 DIAGNOSIS — I10 Essential (primary) hypertension: Secondary | ICD-10-CM | POA: Diagnosis not present

## 2020-11-08 DIAGNOSIS — E039 Hypothyroidism, unspecified: Secondary | ICD-10-CM | POA: Diagnosis not present

## 2020-11-08 DIAGNOSIS — G459 Transient cerebral ischemic attack, unspecified: Secondary | ICD-10-CM | POA: Diagnosis not present

## 2020-11-22 DIAGNOSIS — E039 Hypothyroidism, unspecified: Secondary | ICD-10-CM | POA: Diagnosis not present

## 2020-11-22 DIAGNOSIS — E1169 Type 2 diabetes mellitus with other specified complication: Secondary | ICD-10-CM | POA: Diagnosis not present

## 2020-11-22 DIAGNOSIS — E1165 Type 2 diabetes mellitus with hyperglycemia: Secondary | ICD-10-CM | POA: Diagnosis not present

## 2020-11-22 DIAGNOSIS — G459 Transient cerebral ischemic attack, unspecified: Secondary | ICD-10-CM | POA: Diagnosis not present

## 2020-11-22 DIAGNOSIS — E1142 Type 2 diabetes mellitus with diabetic polyneuropathy: Secondary | ICD-10-CM | POA: Diagnosis not present

## 2020-11-22 DIAGNOSIS — K219 Gastro-esophageal reflux disease without esophagitis: Secondary | ICD-10-CM | POA: Diagnosis not present

## 2020-11-22 DIAGNOSIS — E785 Hyperlipidemia, unspecified: Secondary | ICD-10-CM | POA: Diagnosis not present

## 2020-11-22 DIAGNOSIS — I10 Essential (primary) hypertension: Secondary | ICD-10-CM | POA: Diagnosis not present

## 2020-11-22 DIAGNOSIS — R69 Illness, unspecified: Secondary | ICD-10-CM | POA: Diagnosis not present

## 2021-01-07 ENCOUNTER — Telehealth: Payer: Self-pay | Admitting: Cardiovascular Disease

## 2021-01-07 MED ORDER — ISOSORBIDE MONONITRATE ER 30 MG PO TB24: 30.0000 mg | ORAL_TABLET | Freq: Every day | ORAL | 0 refills | Status: DC

## 2021-01-07 MED ORDER — ATORVASTATIN CALCIUM 80 MG PO TABS: 80.0000 mg | ORAL_TABLET | Freq: Every day | ORAL | 0 refills | Status: DC

## 2021-01-07 NOTE — Telephone Encounter (Signed)
Pt c/o medication issue:  1. Name of Medication: isosorbide mononitrate (IMDUR) 30 MG 24 hr tablet(Expired); atorvastatin (LIPITOR) 80 MG tablet  2. How are you currently taking this medication (dosage and times per day)? As written  3. Are you having a reaction (difficulty breathing--STAT)? No   4. What is your medication issue? Patient out of medication; needs a new prescription sent to [place], [place] to [facility]

## 2021-01-07 NOTE — Telephone Encounter (Signed)
Medications sent to patient preferred pharmacy.

## 2021-01-17 DIAGNOSIS — E1165 Type 2 diabetes mellitus with hyperglycemia: Secondary | ICD-10-CM | POA: Diagnosis not present

## 2021-01-17 DIAGNOSIS — E039 Hypothyroidism, unspecified: Secondary | ICD-10-CM | POA: Diagnosis not present

## 2021-01-27 DIAGNOSIS — E785 Hyperlipidemia, unspecified: Secondary | ICD-10-CM | POA: Diagnosis not present

## 2021-01-27 DIAGNOSIS — R69 Illness, unspecified: Secondary | ICD-10-CM | POA: Diagnosis not present

## 2021-01-27 DIAGNOSIS — I10 Essential (primary) hypertension: Secondary | ICD-10-CM | POA: Diagnosis not present

## 2021-01-27 DIAGNOSIS — E1142 Type 2 diabetes mellitus with diabetic polyneuropathy: Secondary | ICD-10-CM | POA: Diagnosis not present

## 2021-01-27 DIAGNOSIS — E119 Type 2 diabetes mellitus without complications: Secondary | ICD-10-CM | POA: Diagnosis not present

## 2021-01-27 DIAGNOSIS — K219 Gastro-esophageal reflux disease without esophagitis: Secondary | ICD-10-CM | POA: Diagnosis not present

## 2021-01-27 DIAGNOSIS — G459 Transient cerebral ischemic attack, unspecified: Secondary | ICD-10-CM | POA: Diagnosis not present

## 2021-01-27 DIAGNOSIS — E039 Hypothyroidism, unspecified: Secondary | ICD-10-CM | POA: Diagnosis not present

## 2021-01-27 DIAGNOSIS — E1165 Type 2 diabetes mellitus with hyperglycemia: Secondary | ICD-10-CM | POA: Diagnosis not present

## 2021-02-22 IMAGING — MR MR LUMBAR SPINE WO/W CM
4 of 7 series · 17 of 48 positions shown · IV contrast (19 ml multihance)
Comparison: MRI 01/24/2019

CLINICAL DATA: History of a fall on 11/25/2018 with persistent back
and bilateral buttock pain with left leg weakness and numbness.

EXAM:
MRI LUMBAR SPINE WITHOUT AND WITH CONTRAST
TECHNIQUE: Multiplanar and multiecho pulse sequences of the lumbar spine were
obtained without and with intravenous contrast.
CONTRAST:  19mL MULTIHANCE GADOBENATE DIMEGLUMINE 529 MG/ML IV SOLN

[Series 6: T1 · sagittal · 4.0mm · 0.73mm/px · 3 of 15 slices shown (1 of 2)]
[im 1/15]
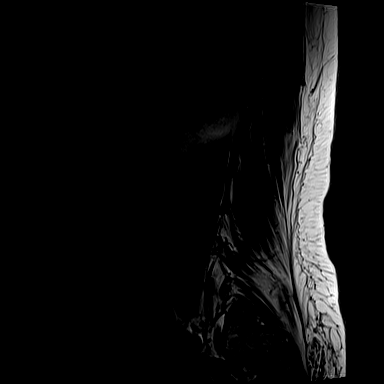
[im 10/15]
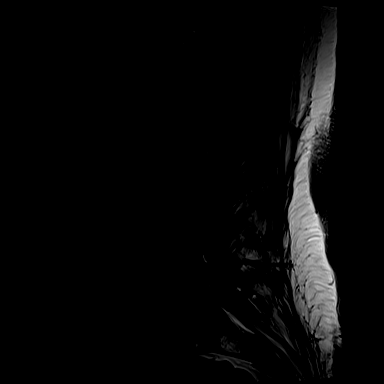
[im 15/15]
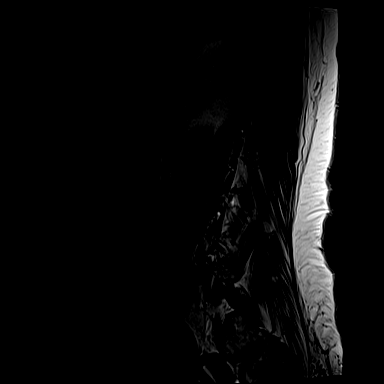

[Series 9: T1 · axial · 4.0mm · 0.28mm/px · z∈[-91,+87]mm · 3 of 42 slices shown (2 of 2)]
[im 5/42]
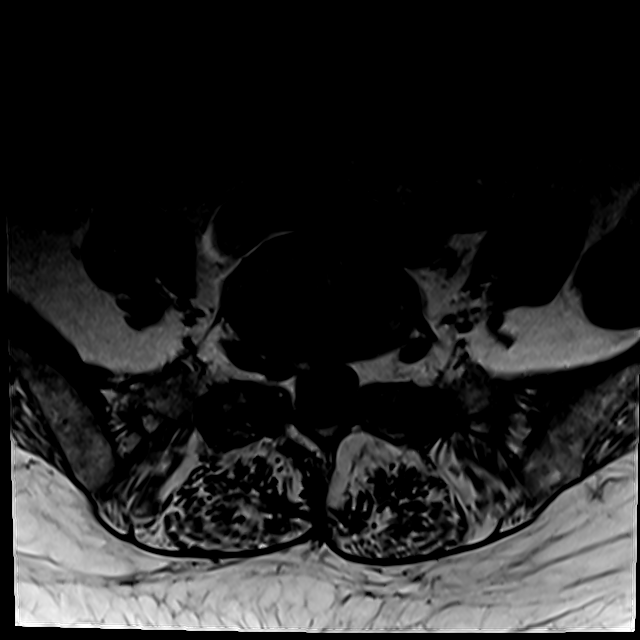
[im 21/42]
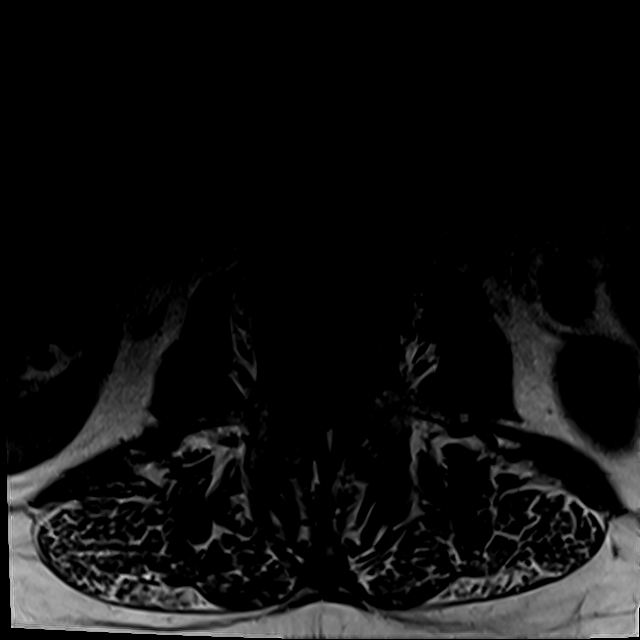
[im 37/42]
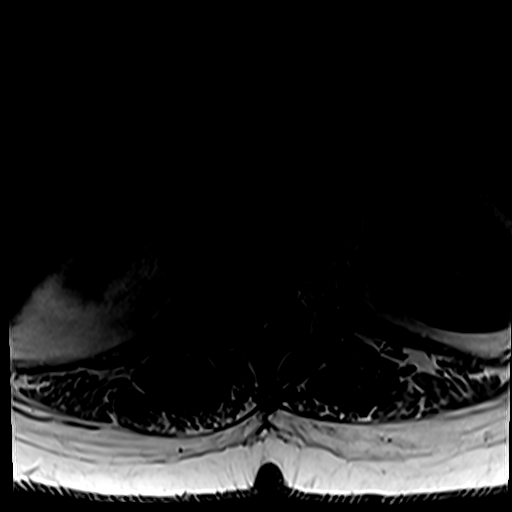

[Series 12: T2 · axial · 4.0mm · 0.28mm/px · z∈[-110,+87]mm · 8 of 42 slices shown (1 of 2)]
[im 1/42]
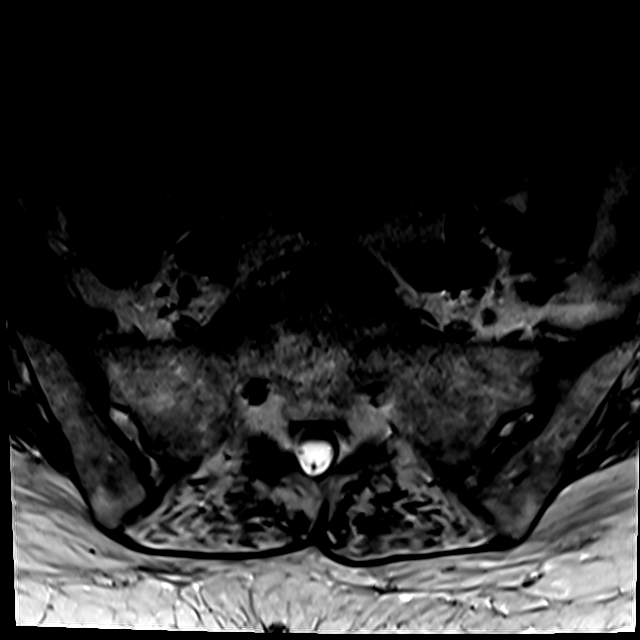
[im 5/42]
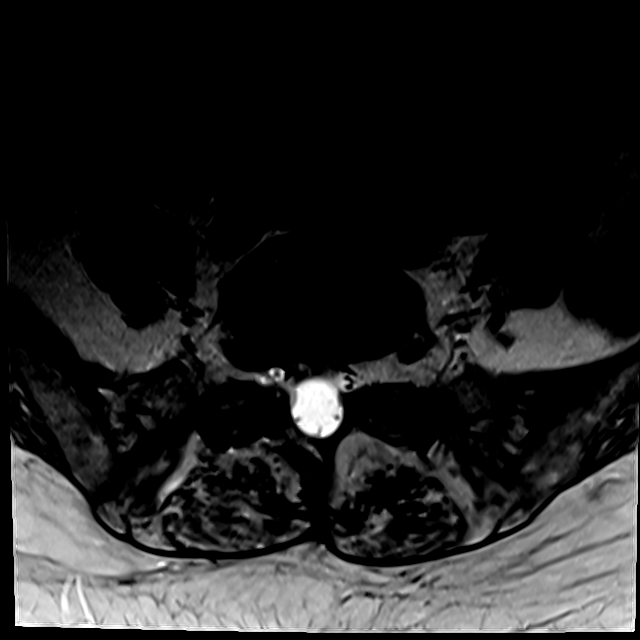
[im 9/42]
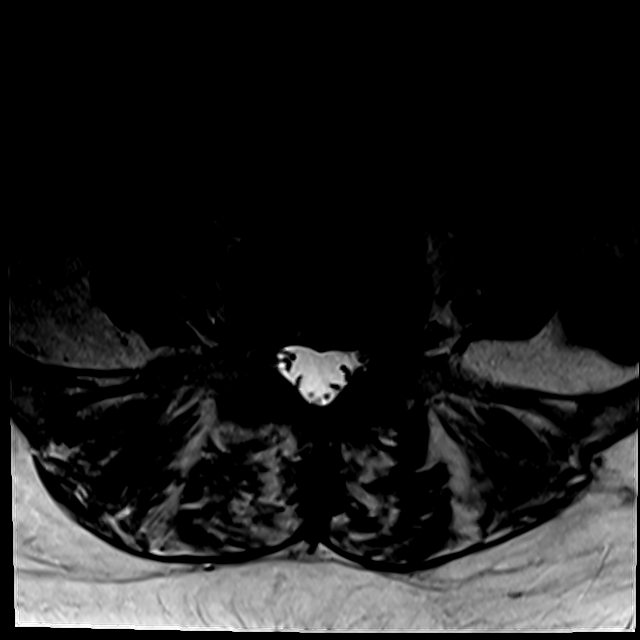
[im 13/42]
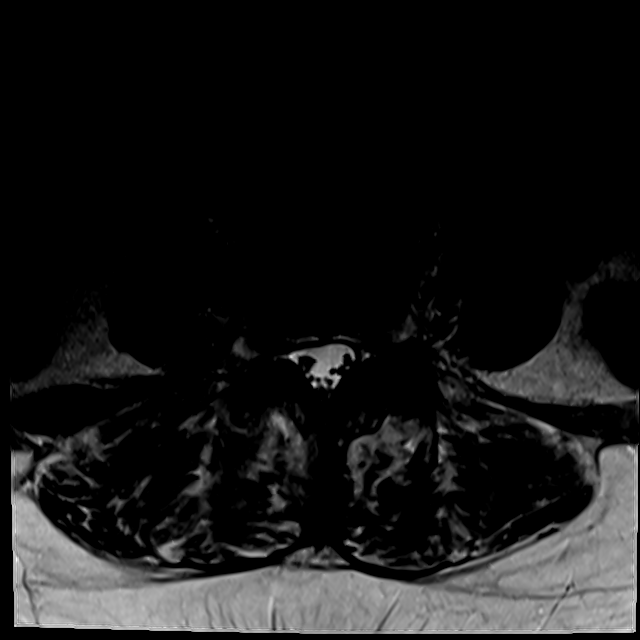
[im 17/42]
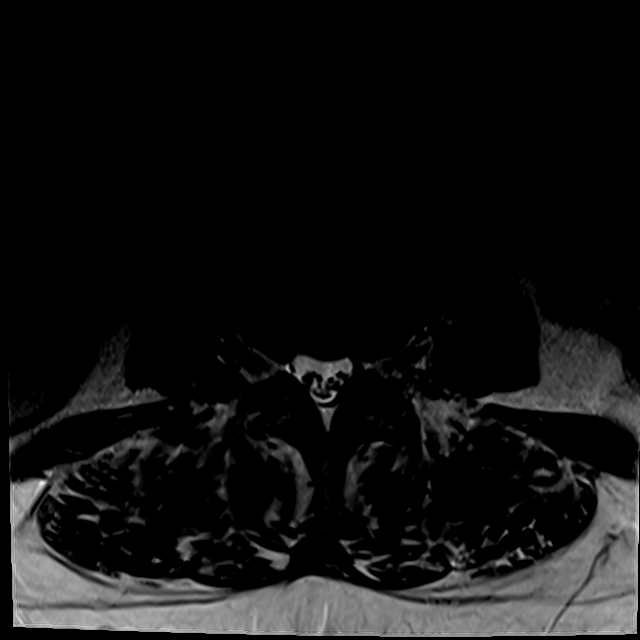
[im 21/42]
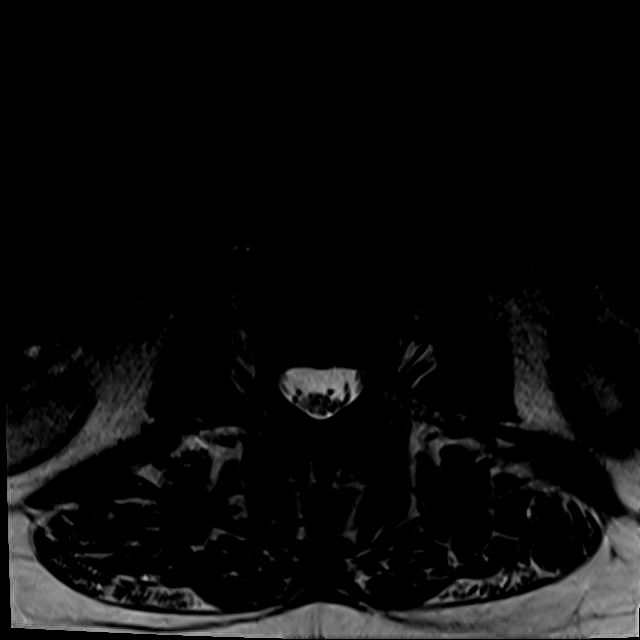
[im 25/42]
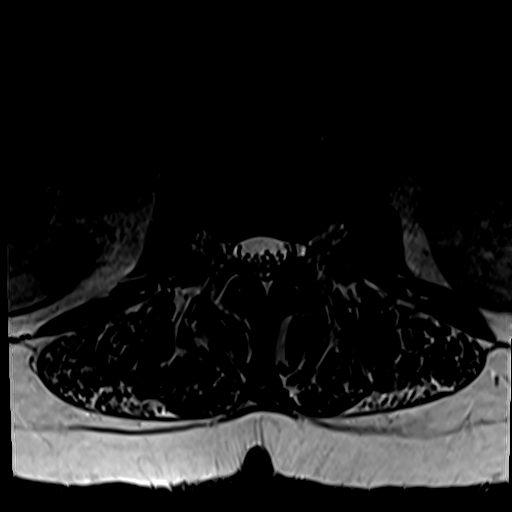
[im 37/42]
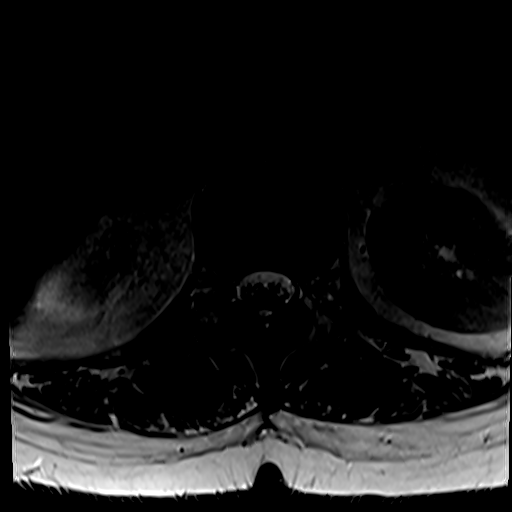

[Series 17: T2 · sagittal · 4.0mm · 0.73mm/px · 3 of 15 slices shown (2 of 2)]
[im 1/15]
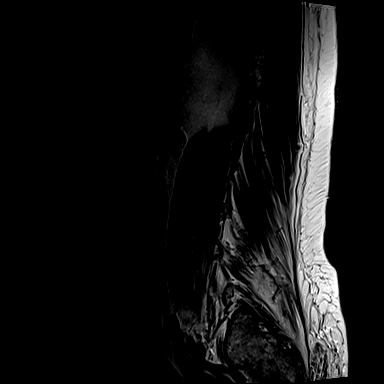
[im 10/15]
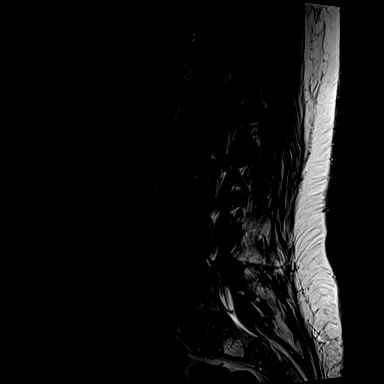
[im 15/15]
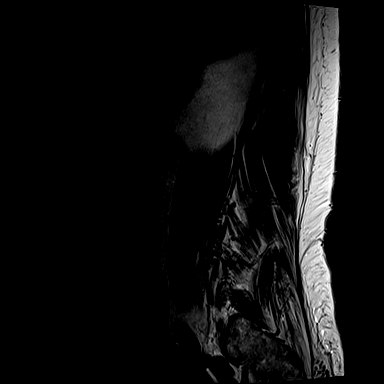

[17 of 48 positions shown; findings below may reference images not displayed]

FINDINGS: Segmentation: There are five lumbar type vertebral bodies. The last
full intervertebral disc space is labeled L5-S1. This corresponds to
the prior MR examination.

Alignment: Normal overall alignment. There is persistent minimal
degenerative anterolisthesis of L4.

Vertebrae: Normal marrow signal. No bone lesions or fractures.
Stable hemangiomas.

Conus medullaris and cauda equina: Conus extends to the L1 level.
Conus and cauda equina appear normal.

Paraspinal and other soft tissues: No significant paraspinal or
retroperitoneal findings.

Disc levels:

T12-L1: No significant findings.

L1-2: Mild facet disease but no disc protrusions, spinal or
foraminal stenosis.

L2-3: Mild facet disease but no disc protrusions, spinal or
foraminal stenosis.

L3-4: Stable shallow central disc protrusion with mild impression on
the ventral thecal sac. Stable mild to moderate facet disease. No
spinal or foraminal stenosis.

L4-5: Bulging slightly uncovered disc and moderate to advanced facet
disease. There is a shallow right paracentral disc protrusion with
mild mass effect on the right side of thecal sac and contributing to
right lateral recess stenosis. There is also mild left foraminal
stenosis due to bulging annulus and significant asymmetric
left-sided facet disease. This appears stable.

L5-S1: Conjoined right L5 and S1 nerve roots again demonstrated. No
disc protrusions, spinal or foraminal stenosis.
IMPRESSION: 1. Stable shallow central disc protrusion at L3-4. No spinal or
foraminal stenosis.
2. Bulging uncovered disc and shallow right paracentral disc
protrusion at L4-5 contributing to mild right lateral recess
stenosis which may be slightly progressive. There is also stable
left foraminal stenosis at this level.
3. Conjoined right L5 and S1 nerve roots at L5-S1.

## 2021-03-11 DIAGNOSIS — I10 Essential (primary) hypertension: Secondary | ICD-10-CM | POA: Diagnosis not present

## 2021-03-11 DIAGNOSIS — G459 Transient cerebral ischemic attack, unspecified: Secondary | ICD-10-CM | POA: Diagnosis not present

## 2021-03-11 DIAGNOSIS — E7800 Pure hypercholesterolemia, unspecified: Secondary | ICD-10-CM | POA: Diagnosis not present

## 2021-03-11 DIAGNOSIS — E039 Hypothyroidism, unspecified: Secondary | ICD-10-CM | POA: Diagnosis not present

## 2021-03-11 DIAGNOSIS — E1169 Type 2 diabetes mellitus with other specified complication: Secondary | ICD-10-CM | POA: Diagnosis not present

## 2021-03-11 DIAGNOSIS — E785 Hyperlipidemia, unspecified: Secondary | ICD-10-CM | POA: Diagnosis not present

## 2021-03-11 DIAGNOSIS — I2510 Atherosclerotic heart disease of native coronary artery without angina pectoris: Secondary | ICD-10-CM | POA: Diagnosis not present

## 2021-03-11 DIAGNOSIS — E1142 Type 2 diabetes mellitus with diabetic polyneuropathy: Secondary | ICD-10-CM | POA: Diagnosis not present

## 2021-03-11 DIAGNOSIS — R69 Illness, unspecified: Secondary | ICD-10-CM | POA: Diagnosis not present

## 2021-03-11 DIAGNOSIS — K219 Gastro-esophageal reflux disease without esophagitis: Secondary | ICD-10-CM | POA: Diagnosis not present

## 2021-03-16 ENCOUNTER — Encounter: Payer: Self-pay | Admitting: Cardiovascular Disease

## 2021-03-16 ENCOUNTER — Other Ambulatory Visit: Payer: Self-pay

## 2021-03-16 ENCOUNTER — Ambulatory Visit: Payer: Medicare HMO | Admitting: Cardiovascular Disease

## 2021-03-16 VITALS — BP 140/68 | HR 65 | Ht 65.0 in | Wt 223.4 lb

## 2021-03-16 DIAGNOSIS — E7800 Pure hypercholesterolemia, unspecified: Secondary | ICD-10-CM

## 2021-03-16 DIAGNOSIS — I10 Essential (primary) hypertension: Secondary | ICD-10-CM | POA: Diagnosis not present

## 2021-03-16 DIAGNOSIS — I2510 Atherosclerotic heart disease of native coronary artery without angina pectoris: Secondary | ICD-10-CM | POA: Diagnosis not present

## 2021-03-16 DIAGNOSIS — M5416 Radiculopathy, lumbar region: Secondary | ICD-10-CM | POA: Diagnosis not present

## 2021-03-16 DIAGNOSIS — I25118 Atherosclerotic heart disease of native coronary artery with other forms of angina pectoris: Secondary | ICD-10-CM

## 2021-03-16 DIAGNOSIS — F17200 Nicotine dependence, unspecified, uncomplicated: Secondary | ICD-10-CM | POA: Diagnosis not present

## 2021-03-16 DIAGNOSIS — R69 Illness, unspecified: Secondary | ICD-10-CM | POA: Diagnosis not present

## 2021-03-16 LAB — LIPID PANEL
Chol/HDL Ratio: 3.8 ratio (ref 0.0–4.4)
Cholesterol, Total: 136 mg/dL (ref 100–199)
HDL: 36 mg/dL — ABNORMAL LOW (ref >39)
LDL Chol Calc (NIH): 64 mg/dL (ref 0–99)
Triglycerides: 221 mg/dL — ABNORMAL HIGH (ref 0–149)
VLDL Cholesterol Cal: 36 mg/dL (ref 5–40)

## 2021-03-16 LAB — HEPATIC FUNCTION PANEL
ALT: 19 IU/L (ref 0–32)
AST: 23 IU/L (ref 0–40)
Albumin: 4.8 g/dL (ref 3.8–4.8)
Alkaline Phosphatase: 71 IU/L (ref 44–121)
Bilirubin Total: 0.3 mg/dL (ref 0.0–1.2)
Bilirubin, Direct: 0.11 mg/dL (ref 0.00–0.40)
Total Protein: 8 g/dL (ref 6.0–8.5)

## 2021-03-16 NOTE — Assessment & Plan Note (Signed)
History of essential hypertension a blood pressure measured today 140/68.  She is on Micardis and hydrochlorothiazide.

## 2021-03-16 NOTE — Assessment & Plan Note (Signed)
History of discontinued tobacco abuse

## 2021-03-16 NOTE — Assessment & Plan Note (Signed)
History of hyperlipidemia on atorvastatin high-dose with lipid profile performed in 08/18/2020 revealing total cholesterol 182, LDL of 116 and HDL 41.  She thinks she may have been out of her atorvastatin at the time that blood was drawn.  We will recheck a lipid liver profile today.

## 2021-03-16 NOTE — Assessment & Plan Note (Signed)
History of CAD status post cardiac catheterization performed by myself via right radial approach 11/03/2019.  Coronary CTA performed 10/18/2019 showed a coronary calcium score of 813.  She did have tandem lesions in the mid to distal LAD, distal circumflex and mid RCA.  She is on Imdur.  It was elected to treat her medically since the caliber of her vessels was fairly small.  She gets brief twinges of chest pain every 6 weeks or so.  This has not changed in frequency or severity.

## 2021-03-16 NOTE — Patient Instructions (Signed)
Medication Instructions:  Your physician recommends that you continue on your current medications as directed. Please refer to the Current Medication list given to you today.  *If you need a refill on your cardiac medications before your next appointment, please call your pharmacy*  Lab Work: Your physician recommends that you return for lab work TODAY:  FLP LFT  If you have labs (blood work) drawn today and your tests are completely normal, you will receive your results only by: [place] (if you have MyChart) OR A paper copy in the mail If you have any lab test that is abnormal or we need to change your treatment, we will call you to review the results.  Testing/Procedures: NONE ordered at this time of appointment   Follow-Up: At [facility], you and your health needs are our priority.  As part of our continuing mission to provide you with exceptional heart care, we have created designated Provider Care Teams.  These Care Teams include your primary Cardiologist (physician) and Advanced Practice Providers (APPs -  Physician Assistants and Nurse Practitioners) who all work together to provide you with the care you need, when you need it.   Your next appointment:   1 year(s)  The format for your next appointment:   In Person  Provider:   Quay Burow, MD  Other Instructions
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
## 2021-03-17 DIAGNOSIS — E1165 Type 2 diabetes mellitus with hyperglycemia: Secondary | ICD-10-CM | POA: Diagnosis not present

## 2021-03-22 DIAGNOSIS — E039 Hypothyroidism, unspecified: Secondary | ICD-10-CM | POA: Diagnosis not present

## 2021-03-22 DIAGNOSIS — I2510 Atherosclerotic heart disease of native coronary artery without angina pectoris: Secondary | ICD-10-CM | POA: Diagnosis not present

## 2021-03-22 DIAGNOSIS — M542 Cervicalgia: Secondary | ICD-10-CM | POA: Diagnosis not present

## 2021-03-22 DIAGNOSIS — M5440 Lumbago with sciatica, unspecified side: Secondary | ICD-10-CM | POA: Diagnosis not present

## 2021-03-22 DIAGNOSIS — Z7984 Long term (current) use of oral hypoglycemic drugs: Secondary | ICD-10-CM | POA: Diagnosis not present

## 2021-03-22 DIAGNOSIS — I10 Essential (primary) hypertension: Secondary | ICD-10-CM | POA: Diagnosis not present

## 2021-03-22 DIAGNOSIS — R69 Illness, unspecified: Secondary | ICD-10-CM | POA: Diagnosis not present

## 2021-03-22 DIAGNOSIS — E7800 Pure hypercholesterolemia, unspecified: Secondary | ICD-10-CM | POA: Diagnosis not present

## 2021-03-22 DIAGNOSIS — E1169 Type 2 diabetes mellitus with other specified complication: Secondary | ICD-10-CM | POA: Diagnosis not present

## 2021-03-22 DIAGNOSIS — Z0000 Encounter for general adult medical examination without abnormal findings: Secondary | ICD-10-CM | POA: Diagnosis not present

## 2021-04-06 DIAGNOSIS — E785 Hyperlipidemia, unspecified: Secondary | ICD-10-CM | POA: Diagnosis not present

## 2021-04-06 DIAGNOSIS — E1169 Type 2 diabetes mellitus with other specified complication: Secondary | ICD-10-CM | POA: Diagnosis not present

## 2021-04-06 DIAGNOSIS — K219 Gastro-esophageal reflux disease without esophagitis: Secondary | ICD-10-CM | POA: Diagnosis not present

## 2021-04-06 DIAGNOSIS — I2510 Atherosclerotic heart disease of native coronary artery without angina pectoris: Secondary | ICD-10-CM | POA: Diagnosis not present

## 2021-04-06 DIAGNOSIS — E7800 Pure hypercholesterolemia, unspecified: Secondary | ICD-10-CM | POA: Diagnosis not present

## 2021-04-06 DIAGNOSIS — R69 Illness, unspecified: Secondary | ICD-10-CM | POA: Diagnosis not present

## 2021-04-06 DIAGNOSIS — E1142 Type 2 diabetes mellitus with diabetic polyneuropathy: Secondary | ICD-10-CM | POA: Diagnosis not present

## 2021-04-06 DIAGNOSIS — E039 Hypothyroidism, unspecified: Secondary | ICD-10-CM | POA: Diagnosis not present

## 2021-04-06 DIAGNOSIS — I10 Essential (primary) hypertension: Secondary | ICD-10-CM | POA: Diagnosis not present

## 2021-04-06 DIAGNOSIS — G459 Transient cerebral ischemic attack, unspecified: Secondary | ICD-10-CM | POA: Diagnosis not present

## 2021-04-27 DIAGNOSIS — M5416 Radiculopathy, lumbar region: Secondary | ICD-10-CM | POA: Diagnosis not present

## 2021-04-29 ENCOUNTER — Telehealth: Payer: Self-pay | Admitting: Cardiovascular Disease

## 2021-04-29 MED ORDER — ATORVASTATIN CALCIUM 80 MG PO TABS: 80.0000 mg | ORAL_TABLET | Freq: Every day | ORAL | 1 refills | Status: DC

## 2021-04-29 MED ORDER — ISOSORBIDE MONONITRATE ER 30 MG PO TB24: 30.0000 mg | ORAL_TABLET | Freq: Every day | ORAL | 1 refills | Status: DC

## 2021-04-29 NOTE — Telephone Encounter (Signed)
Indur 30 mg daily and Lipitor 80 mg daily sent into [facility].

## 2021-04-29 NOTE — Telephone Encounter (Signed)
*  STAT* If patient is at the pharmacy, call can be transferred to refill team.   1. Which medications need to be refilled? (please list name of each medication and dose if known)  isosorbide mononitrate (IMDUR) 30 MG 24 hr tablet  atorvastatin (LIPITOR) 80 MG tablet  2. Which pharmacy/location (including street and city if local pharmacy) is medication to be sent to? [facility] [place] [facility]  3. Do they need a 30 day or 90 day supply? 90 with refills    Patient was told at her last office visit 03/16/21 to continue all medications as normal. The rx for the IMDUR expired. Please confirm as to whether or not the patient needs to stop taking the medication

## 2021-05-06 DIAGNOSIS — G459 Transient cerebral ischemic attack, unspecified: Secondary | ICD-10-CM | POA: Diagnosis not present

## 2021-05-06 DIAGNOSIS — E1169 Type 2 diabetes mellitus with other specified complication: Secondary | ICD-10-CM | POA: Diagnosis not present

## 2021-05-06 DIAGNOSIS — E785 Hyperlipidemia, unspecified: Secondary | ICD-10-CM | POA: Diagnosis not present

## 2021-05-06 DIAGNOSIS — E1142 Type 2 diabetes mellitus with diabetic polyneuropathy: Secondary | ICD-10-CM | POA: Diagnosis not present

## 2021-05-06 DIAGNOSIS — E7800 Pure hypercholesterolemia, unspecified: Secondary | ICD-10-CM | POA: Diagnosis not present

## 2021-05-06 DIAGNOSIS — K219 Gastro-esophageal reflux disease without esophagitis: Secondary | ICD-10-CM | POA: Diagnosis not present

## 2021-05-06 DIAGNOSIS — E039 Hypothyroidism, unspecified: Secondary | ICD-10-CM | POA: Diagnosis not present

## 2021-05-06 DIAGNOSIS — R69 Illness, unspecified: Secondary | ICD-10-CM | POA: Diagnosis not present

## 2021-05-06 DIAGNOSIS — I10 Essential (primary) hypertension: Secondary | ICD-10-CM | POA: Diagnosis not present

## 2021-05-06 DIAGNOSIS — I2510 Atherosclerotic heart disease of native coronary artery without angina pectoris: Secondary | ICD-10-CM | POA: Diagnosis not present

## 2021-06-10 DIAGNOSIS — I2510 Atherosclerotic heart disease of native coronary artery without angina pectoris: Secondary | ICD-10-CM | POA: Diagnosis not present

## 2021-06-10 DIAGNOSIS — E1169 Type 2 diabetes mellitus with other specified complication: Secondary | ICD-10-CM | POA: Diagnosis not present

## 2021-06-10 DIAGNOSIS — R69 Illness, unspecified: Secondary | ICD-10-CM | POA: Diagnosis not present

## 2021-06-10 DIAGNOSIS — G459 Transient cerebral ischemic attack, unspecified: Secondary | ICD-10-CM | POA: Diagnosis not present

## 2021-06-10 DIAGNOSIS — K219 Gastro-esophageal reflux disease without esophagitis: Secondary | ICD-10-CM | POA: Diagnosis not present

## 2021-06-10 DIAGNOSIS — E7800 Pure hypercholesterolemia, unspecified: Secondary | ICD-10-CM | POA: Diagnosis not present

## 2021-06-10 DIAGNOSIS — E785 Hyperlipidemia, unspecified: Secondary | ICD-10-CM | POA: Diagnosis not present

## 2021-06-10 DIAGNOSIS — I10 Essential (primary) hypertension: Secondary | ICD-10-CM | POA: Diagnosis not present

## 2021-06-10 DIAGNOSIS — E1142 Type 2 diabetes mellitus with diabetic polyneuropathy: Secondary | ICD-10-CM | POA: Diagnosis not present

## 2021-06-10 DIAGNOSIS — E039 Hypothyroidism, unspecified: Secondary | ICD-10-CM | POA: Diagnosis not present

## 2021-07-12 DIAGNOSIS — R69 Illness, unspecified: Secondary | ICD-10-CM | POA: Diagnosis not present

## 2021-07-12 DIAGNOSIS — I2510 Atherosclerotic heart disease of native coronary artery without angina pectoris: Secondary | ICD-10-CM | POA: Diagnosis not present

## 2021-07-12 DIAGNOSIS — E039 Hypothyroidism, unspecified: Secondary | ICD-10-CM | POA: Diagnosis not present

## 2021-07-12 DIAGNOSIS — G459 Transient cerebral ischemic attack, unspecified: Secondary | ICD-10-CM | POA: Diagnosis not present

## 2021-07-12 DIAGNOSIS — K219 Gastro-esophageal reflux disease without esophagitis: Secondary | ICD-10-CM | POA: Diagnosis not present

## 2021-07-12 DIAGNOSIS — E785 Hyperlipidemia, unspecified: Secondary | ICD-10-CM | POA: Diagnosis not present

## 2021-07-12 DIAGNOSIS — E7800 Pure hypercholesterolemia, unspecified: Secondary | ICD-10-CM | POA: Diagnosis not present

## 2021-07-12 DIAGNOSIS — I10 Essential (primary) hypertension: Secondary | ICD-10-CM | POA: Diagnosis not present

## 2021-07-12 DIAGNOSIS — E1142 Type 2 diabetes mellitus with diabetic polyneuropathy: Secondary | ICD-10-CM | POA: Diagnosis not present

## 2021-07-12 DIAGNOSIS — E1169 Type 2 diabetes mellitus with other specified complication: Secondary | ICD-10-CM | POA: Diagnosis not present

## 2021-07-18 DIAGNOSIS — E1169 Type 2 diabetes mellitus with other specified complication: Secondary | ICD-10-CM | POA: Diagnosis not present

## 2021-07-20 DIAGNOSIS — I2510 Atherosclerotic heart disease of native coronary artery without angina pectoris: Secondary | ICD-10-CM | POA: Diagnosis not present

## 2021-07-20 DIAGNOSIS — R69 Illness, unspecified: Secondary | ICD-10-CM | POA: Diagnosis not present

## 2021-07-20 DIAGNOSIS — K219 Gastro-esophageal reflux disease without esophagitis: Secondary | ICD-10-CM | POA: Diagnosis not present

## 2021-07-20 DIAGNOSIS — E7800 Pure hypercholesterolemia, unspecified: Secondary | ICD-10-CM | POA: Diagnosis not present

## 2021-07-20 DIAGNOSIS — E1142 Type 2 diabetes mellitus with diabetic polyneuropathy: Secondary | ICD-10-CM | POA: Diagnosis not present

## 2021-07-20 DIAGNOSIS — E039 Hypothyroidism, unspecified: Secondary | ICD-10-CM | POA: Diagnosis not present

## 2021-07-20 DIAGNOSIS — E1169 Type 2 diabetes mellitus with other specified complication: Secondary | ICD-10-CM | POA: Diagnosis not present

## 2021-07-20 DIAGNOSIS — I10 Essential (primary) hypertension: Secondary | ICD-10-CM | POA: Diagnosis not present

## 2021-08-03 ENCOUNTER — Telehealth: Payer: Self-pay | Admitting: Cardiovascular Disease

## 2021-08-03 MED ORDER — ISOSORBIDE MONONITRATE ER 30 MG PO TB24: 30.0000 mg | ORAL_TABLET | Freq: Every day | ORAL | 3 refills | Status: DC

## 2021-08-03 NOTE — Telephone Encounter (Signed)
*  STAT* If patient is at the pharmacy, call can be transferred to refill team.   1. Which medications need to be refilled? (please list name of each medication and dose if known)  Isosorbide   2. Which pharmacy/location (including street and city if local pharmacy) is medication to be sent to? [facility]  3. Do they need a 30 day or 90 day supply? 90 days and refills

## 2021-08-10 DIAGNOSIS — H31002 Unspecified chorioretinal scars, left eye: Secondary | ICD-10-CM | POA: Diagnosis not present

## 2021-08-10 DIAGNOSIS — E119 Type 2 diabetes mellitus without complications: Secondary | ICD-10-CM | POA: Diagnosis not present

## 2021-08-29 DIAGNOSIS — H66003 Acute suppurative otitis media without spontaneous rupture of ear drum, bilateral: Secondary | ICD-10-CM | POA: Diagnosis not present

## 2021-08-29 DIAGNOSIS — Z7984 Long term (current) use of oral hypoglycemic drugs: Secondary | ICD-10-CM | POA: Diagnosis not present

## 2021-08-29 DIAGNOSIS — E1169 Type 2 diabetes mellitus with other specified complication: Secondary | ICD-10-CM | POA: Diagnosis not present

## 2021-08-29 DIAGNOSIS — Z7985 Long-term (current) use of injectable non-insulin antidiabetic drugs: Secondary | ICD-10-CM | POA: Diagnosis not present

## 2021-08-29 DIAGNOSIS — I10 Essential (primary) hypertension: Secondary | ICD-10-CM | POA: Diagnosis not present

## 2021-09-06 DIAGNOSIS — Z1211 Encounter for screening for malignant neoplasm of colon: Secondary | ICD-10-CM | POA: Diagnosis not present

## 2021-09-06 DIAGNOSIS — Z01419 Encounter for gynecological examination (general) (routine) without abnormal findings: Secondary | ICD-10-CM | POA: Diagnosis not present

## 2021-09-06 DIAGNOSIS — L292 Pruritus vulvae: Secondary | ICD-10-CM | POA: Diagnosis not present

## 2021-09-15 DIAGNOSIS — R69 Illness, unspecified: Secondary | ICD-10-CM | POA: Diagnosis not present

## 2021-09-15 DIAGNOSIS — I2510 Atherosclerotic heart disease of native coronary artery without angina pectoris: Secondary | ICD-10-CM | POA: Diagnosis not present

## 2021-09-15 DIAGNOSIS — G459 Transient cerebral ischemic attack, unspecified: Secondary | ICD-10-CM | POA: Diagnosis not present

## 2021-09-15 DIAGNOSIS — E785 Hyperlipidemia, unspecified: Secondary | ICD-10-CM | POA: Diagnosis not present

## 2021-09-15 DIAGNOSIS — F3341 Major depressive disorder, recurrent, in partial remission: Secondary | ICD-10-CM | POA: Diagnosis not present

## 2021-09-15 DIAGNOSIS — M47816 Spondylosis without myelopathy or radiculopathy, lumbar region: Secondary | ICD-10-CM | POA: Diagnosis not present

## 2021-09-15 DIAGNOSIS — F331 Major depressive disorder, recurrent, moderate: Secondary | ICD-10-CM | POA: Diagnosis not present

## 2021-09-15 DIAGNOSIS — I10 Essential (primary) hypertension: Secondary | ICD-10-CM | POA: Diagnosis not present

## 2021-09-15 DIAGNOSIS — E7800 Pure hypercholesterolemia, unspecified: Secondary | ICD-10-CM | POA: Diagnosis not present

## 2021-09-15 DIAGNOSIS — E1169 Type 2 diabetes mellitus with other specified complication: Secondary | ICD-10-CM | POA: Diagnosis not present

## 2021-09-15 DIAGNOSIS — E039 Hypothyroidism, unspecified: Secondary | ICD-10-CM | POA: Diagnosis not present

## 2021-09-15 DIAGNOSIS — E1142 Type 2 diabetes mellitus with diabetic polyneuropathy: Secondary | ICD-10-CM | POA: Diagnosis not present

## 2021-09-15 DIAGNOSIS — K219 Gastro-esophageal reflux disease without esophagitis: Secondary | ICD-10-CM | POA: Diagnosis not present

## 2021-09-16 ENCOUNTER — Other Ambulatory Visit: Payer: Self-pay | Admitting: Obstetrics and Gynecology

## 2021-09-16 ENCOUNTER — Other Ambulatory Visit: Payer: Self-pay | Admitting: Family Medicine

## 2021-09-16 DIAGNOSIS — Z1231 Encounter for screening mammogram for malignant neoplasm of breast: Secondary | ICD-10-CM

## 2021-09-21 DIAGNOSIS — S83242A Other tear of medial meniscus, current injury, left knee, initial encounter: Secondary | ICD-10-CM | POA: Diagnosis not present

## 2021-09-21 DIAGNOSIS — M7061 Trochanteric bursitis, right hip: Secondary | ICD-10-CM | POA: Diagnosis not present

## 2021-10-06 ENCOUNTER — Ambulatory Visit
Admission: RE | Admit: 2021-10-06 | Discharge: 2021-10-06 | Disposition: A | Discharge status: Home / Self Care | Payer: Medicare HMO | Source: Ambulatory Visit

## 2021-10-06 ENCOUNTER — Other Ambulatory Visit: Payer: Self-pay

## 2021-10-06 DIAGNOSIS — Z1231 Encounter for screening mammogram for malignant neoplasm of breast: Secondary | ICD-10-CM

## 2021-10-17 DIAGNOSIS — E7800 Pure hypercholesterolemia, unspecified: Secondary | ICD-10-CM | POA: Diagnosis not present

## 2021-10-17 DIAGNOSIS — R69 Illness, unspecified: Secondary | ICD-10-CM | POA: Diagnosis not present

## 2021-10-17 DIAGNOSIS — E039 Hypothyroidism, unspecified: Secondary | ICD-10-CM | POA: Diagnosis not present

## 2021-10-17 DIAGNOSIS — I10 Essential (primary) hypertension: Secondary | ICD-10-CM | POA: Diagnosis not present

## 2021-10-17 DIAGNOSIS — I2510 Atherosclerotic heart disease of native coronary artery without angina pectoris: Secondary | ICD-10-CM | POA: Diagnosis not present

## 2021-10-17 DIAGNOSIS — M5440 Lumbago with sciatica, unspecified side: Secondary | ICD-10-CM | POA: Diagnosis not present

## 2021-10-17 DIAGNOSIS — E1169 Type 2 diabetes mellitus with other specified complication: Secondary | ICD-10-CM | POA: Diagnosis not present

## 2021-10-20 DIAGNOSIS — M25552 Pain in left hip: Secondary | ICD-10-CM | POA: Diagnosis not present

## 2021-11-15 DIAGNOSIS — E1169 Type 2 diabetes mellitus with other specified complication: Secondary | ICD-10-CM | POA: Diagnosis not present

## 2021-11-17 DIAGNOSIS — M7062 Trochanteric bursitis, left hip: Secondary | ICD-10-CM | POA: Diagnosis not present

## 2021-11-17 DIAGNOSIS — M7061 Trochanteric bursitis, right hip: Secondary | ICD-10-CM | POA: Diagnosis not present

## 2022-01-08 ENCOUNTER — Other Ambulatory Visit: Payer: Self-pay | Admitting: Cardiovascular Disease

## 2022-01-11 DIAGNOSIS — K5730 Diverticulosis of large intestine without perforation or abscess without bleeding: Secondary | ICD-10-CM | POA: Diagnosis not present

## 2022-01-11 DIAGNOSIS — Z1211 Encounter for screening for malignant neoplasm of colon: Secondary | ICD-10-CM | POA: Diagnosis not present

## 2022-01-11 DIAGNOSIS — K648 Other hemorrhoids: Secondary | ICD-10-CM | POA: Diagnosis not present

## 2022-01-11 DIAGNOSIS — K635 Polyp of colon: Secondary | ICD-10-CM | POA: Diagnosis not present

## 2022-01-13 DIAGNOSIS — K635 Polyp of colon: Secondary | ICD-10-CM | POA: Diagnosis not present

## 2022-01-15 DIAGNOSIS — E1169 Type 2 diabetes mellitus with other specified complication: Secondary | ICD-10-CM | POA: Diagnosis not present

## 2022-03-14 ENCOUNTER — Encounter: Payer: Self-pay | Admitting: Cardiovascular Disease

## 2022-03-14 ENCOUNTER — Ambulatory Visit: Payer: Medicare HMO | Admitting: Cardiovascular Disease

## 2022-03-14 DIAGNOSIS — I25119 Atherosclerotic heart disease of native coronary artery with unspecified angina pectoris: Secondary | ICD-10-CM | POA: Diagnosis not present

## 2022-03-14 DIAGNOSIS — I10 Essential (primary) hypertension: Secondary | ICD-10-CM | POA: Diagnosis not present

## 2022-03-14 DIAGNOSIS — E7800 Pure hypercholesterolemia, unspecified: Secondary | ICD-10-CM | POA: Diagnosis not present
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
## 2022-03-20 DIAGNOSIS — M5451 Vertebrogenic low back pain: Secondary | ICD-10-CM | POA: Diagnosis not present

## 2022-03-20 DIAGNOSIS — M25551 Pain in right hip: Secondary | ICD-10-CM | POA: Diagnosis not present

## 2022-03-20 DIAGNOSIS — M7918 Myalgia, other site: Secondary | ICD-10-CM | POA: Diagnosis not present

## 2022-03-20 DIAGNOSIS — M25552 Pain in left hip: Secondary | ICD-10-CM | POA: Diagnosis not present

## 2022-04-11 DIAGNOSIS — M5416 Radiculopathy, lumbar region: Secondary | ICD-10-CM | POA: Diagnosis not present

## 2022-04-18 DIAGNOSIS — E1169 Type 2 diabetes mellitus with other specified complication: Secondary | ICD-10-CM | POA: Diagnosis not present

## 2022-04-18 DIAGNOSIS — R69 Illness, unspecified: Secondary | ICD-10-CM | POA: Diagnosis not present

## 2022-04-18 DIAGNOSIS — E7800 Pure hypercholesterolemia, unspecified: Secondary | ICD-10-CM | POA: Diagnosis not present

## 2022-04-18 DIAGNOSIS — I10 Essential (primary) hypertension: Secondary | ICD-10-CM | POA: Diagnosis not present

## 2022-04-18 DIAGNOSIS — E1142 Type 2 diabetes mellitus with diabetic polyneuropathy: Secondary | ICD-10-CM | POA: Diagnosis not present

## 2022-04-18 DIAGNOSIS — E039 Hypothyroidism, unspecified: Secondary | ICD-10-CM | POA: Diagnosis not present

## 2022-05-26 DIAGNOSIS — E039 Hypothyroidism, unspecified: Secondary | ICD-10-CM | POA: Diagnosis not present

## 2022-05-26 DIAGNOSIS — R051 Acute cough: Secondary | ICD-10-CM | POA: Diagnosis not present

## 2022-05-26 DIAGNOSIS — E1169 Type 2 diabetes mellitus with other specified complication: Secondary | ICD-10-CM | POA: Diagnosis not present

## 2022-05-26 DIAGNOSIS — Z6837 Body mass index (BMI) 37.0-37.9, adult: Secondary | ICD-10-CM | POA: Diagnosis not present

## 2022-06-06 DIAGNOSIS — Z23 Encounter for immunization: Secondary | ICD-10-CM | POA: Diagnosis not present

## 2022-06-06 DIAGNOSIS — Z0000 Encounter for general adult medical examination without abnormal findings: Secondary | ICD-10-CM | POA: Diagnosis not present

## 2022-06-06 DIAGNOSIS — Z1389 Encounter for screening for other disorder: Secondary | ICD-10-CM | POA: Diagnosis not present

## 2022-06-16 DIAGNOSIS — R69 Illness, unspecified: Secondary | ICD-10-CM | POA: Diagnosis not present

## 2022-06-16 DIAGNOSIS — M1990 Unspecified osteoarthritis, unspecified site: Secondary | ICD-10-CM | POA: Diagnosis not present

## 2022-06-16 DIAGNOSIS — R32 Unspecified urinary incontinence: Secondary | ICD-10-CM | POA: Diagnosis not present

## 2022-06-16 DIAGNOSIS — I252 Old myocardial infarction: Secondary | ICD-10-CM | POA: Diagnosis not present

## 2022-06-16 DIAGNOSIS — I25118 Atherosclerotic heart disease of native coronary artery with other forms of angina pectoris: Secondary | ICD-10-CM | POA: Diagnosis not present

## 2022-06-16 DIAGNOSIS — I10 Essential (primary) hypertension: Secondary | ICD-10-CM | POA: Diagnosis not present

## 2022-06-16 DIAGNOSIS — M5430 Sciatica, unspecified side: Secondary | ICD-10-CM | POA: Diagnosis not present

## 2022-06-16 DIAGNOSIS — E785 Hyperlipidemia, unspecified: Secondary | ICD-10-CM | POA: Diagnosis not present

## 2022-06-16 DIAGNOSIS — M62838 Other muscle spasm: Secondary | ICD-10-CM | POA: Diagnosis not present

## 2022-06-16 DIAGNOSIS — E039 Hypothyroidism, unspecified: Secondary | ICD-10-CM | POA: Diagnosis not present

## 2022-06-16 DIAGNOSIS — E1142 Type 2 diabetes mellitus with diabetic polyneuropathy: Secondary | ICD-10-CM | POA: Diagnosis not present

## 2022-07-16 ENCOUNTER — Other Ambulatory Visit: Payer: Self-pay | Admitting: Cardiovascular Disease

## 2022-08-03 DIAGNOSIS — M25562 Pain in left knee: Secondary | ICD-10-CM | POA: Diagnosis not present

## 2022-08-03 DIAGNOSIS — M1712 Unilateral primary osteoarthritis, left knee: Secondary | ICD-10-CM | POA: Diagnosis not present

## 2022-08-03 DIAGNOSIS — M5442 Lumbago with sciatica, left side: Secondary | ICD-10-CM | POA: Diagnosis not present

## 2022-08-03 DIAGNOSIS — M25561 Pain in right knee: Secondary | ICD-10-CM | POA: Diagnosis not present

## 2022-08-14 DIAGNOSIS — H31002 Unspecified chorioretinal scars, left eye: Secondary | ICD-10-CM | POA: Diagnosis not present

## 2022-08-14 DIAGNOSIS — H43813 Vitreous degeneration, bilateral: Secondary | ICD-10-CM | POA: Diagnosis not present

## 2022-08-14 DIAGNOSIS — E119 Type 2 diabetes mellitus without complications: Secondary | ICD-10-CM | POA: Diagnosis not present

## 2022-08-22 DIAGNOSIS — E039 Hypothyroidism, unspecified: Secondary | ICD-10-CM | POA: Diagnosis not present

## 2022-08-22 DIAGNOSIS — K219 Gastro-esophageal reflux disease without esophagitis: Secondary | ICD-10-CM | POA: Diagnosis not present

## 2022-08-22 DIAGNOSIS — R69 Illness, unspecified: Secondary | ICD-10-CM | POA: Diagnosis not present

## 2022-08-22 DIAGNOSIS — E1142 Type 2 diabetes mellitus with diabetic polyneuropathy: Secondary | ICD-10-CM | POA: Diagnosis not present

## 2022-08-22 DIAGNOSIS — E1169 Type 2 diabetes mellitus with other specified complication: Secondary | ICD-10-CM | POA: Diagnosis not present

## 2022-08-22 DIAGNOSIS — I10 Essential (primary) hypertension: Secondary | ICD-10-CM | POA: Diagnosis not present

## 2022-08-22 DIAGNOSIS — E7800 Pure hypercholesterolemia, unspecified: Secondary | ICD-10-CM | POA: Diagnosis not present

## 2022-09-04 ENCOUNTER — Other Ambulatory Visit: Payer: Self-pay | Admitting: Obstetrics and Gynecology

## 2022-09-04 DIAGNOSIS — Z1239 Encounter for other screening for malignant neoplasm of breast: Secondary | ICD-10-CM | POA: Diagnosis not present

## 2022-09-04 DIAGNOSIS — Z13820 Encounter for screening for osteoporosis: Secondary | ICD-10-CM

## 2022-09-04 DIAGNOSIS — Z6837 Body mass index (BMI) 37.0-37.9, adult: Secondary | ICD-10-CM | POA: Diagnosis not present

## 2022-09-04 DIAGNOSIS — Z1211 Encounter for screening for malignant neoplasm of colon: Secondary | ICD-10-CM | POA: Diagnosis not present

## 2022-09-04 DIAGNOSIS — Z124 Encounter for screening for malignant neoplasm of cervix: Secondary | ICD-10-CM | POA: Diagnosis not present

## 2022-09-04 DIAGNOSIS — N762 Acute vulvitis: Secondary | ICD-10-CM | POA: Diagnosis not present

## 2022-09-14 ENCOUNTER — Other Ambulatory Visit: Payer: Self-pay | Admitting: Obstetrics and Gynecology

## 2022-09-14 DIAGNOSIS — Z13820 Encounter for screening for osteoporosis: Secondary | ICD-10-CM

## 2022-09-14 DIAGNOSIS — Z1231 Encounter for screening mammogram for malignant neoplasm of breast: Secondary | ICD-10-CM

## 2022-09-15 ENCOUNTER — Other Ambulatory Visit: Payer: Self-pay | Admitting: *Deleted

## 2022-09-15 DIAGNOSIS — Z87891 Personal history of nicotine dependence: Secondary | ICD-10-CM

## 2022-09-15 DIAGNOSIS — Z122 Encounter for screening for malignant neoplasm of respiratory organs: Secondary | ICD-10-CM

## 2022-09-21 ENCOUNTER — Ambulatory Visit
Admission: RE | Admit: 2022-09-21 | Discharge: 2022-09-21 | Disposition: A | Discharge status: Home / Self Care | Payer: Medicare HMO | Source: Ambulatory Visit | Attending: Obstetrics and Gynecology | Admitting: Obstetrics and Gynecology

## 2022-09-21 DIAGNOSIS — Z13820 Encounter for screening for osteoporosis: Secondary | ICD-10-CM

## 2022-09-21 DIAGNOSIS — M8589 Other specified disorders of bone density and structure, multiple sites: Secondary | ICD-10-CM | POA: Diagnosis not present

## 2022-09-21 DIAGNOSIS — Z780 Asymptomatic menopausal state: Secondary | ICD-10-CM | POA: Diagnosis not present

## 2022-10-24 NOTE — Progress Notes (Unsigned)
Virtual Visit via Telephone Note  I connected with [name] on 10/24/22 at  1:30 PM EST by telephone and verified that I am speaking with the correct person using two identifiers.  Location: Patient: Home Provider: Office    I discussed the limitations, risks, security and privacy concerns of performing an evaluation and management service by telephone and the availability of in person appointments. I also discussed with the patient that there may be a patient responsible charge related to this service. The patient expressed understanding and agreed to proceed.  Shared Decision Making Visit Lung Cancer Screening Program [phone])   Eligibility: Age 72 y.o. Pack Years Smoking History Calculation 30 (# packs/per year x # years smoked) Recent History of coughing up blood  no Unexplained weight loss? no ( >Than 15 pounds within the last 6 months ) Prior History Lung / other cancer no (Diagnosis within the last 5 years already requiring surveillance chest CT Scans). Smoking Status Former Smoker Former Smokers: Years since quit: 3 years  Quit Date: 2021  Visit Components: Discussion included one or more decision making aids. yes Discussion included risk/benefits of screening. yes Discussion included potential follow up diagnostic testing for abnormal scans. yes Discussion included meaning and risk of over diagnosis. yes Discussion included meaning and risk of False Positives. yes Discussion included meaning of total radiation exposure. yes  Counseling Included: Importance of adherence to annual lung cancer LDCT screening. yes Impact of comorbidities on ability to participate in the program. yes Ability and willingness to under diagnostic treatment. yes  Smoking Cessation Counseling: Current Smokers:  Discussed importance of smoking cessation. yes Information about tobacco cessation classes and interventions provided to patient. yes Patient provided with "ticket" for LDCT  Scan. Na Symptomatic Patient. no  Counseling(Intermediate counseling: > three minutes) 99406 Diagnosis Code: Tobacco Use Z72.0 Asymptomatic Patient yes  Counseling (Intermediate counseling: > three minutes counseling) Q9826 Former Smokers:  Discussed the importance of maintaining cigarette abstinence. yes Diagnosis Code: Personal History of Nicotine Dependence. E15.830 Information about tobacco cessation classes and interventions provided to patient. Yes Patient provided with "ticket" for LDCT Scan. NA Written Order for Lung Cancer Screening with LDCT placed in Epic. Yes (CT Chest Lung Cancer Screening Low Dose W/O CM) [id number] Z12.2-Screening of respiratory organs Z87.891-Personal history of nicotine dependence  I have spent 25 minutes of face to face/ virtual visit   time with [name] discussing the risks and benefits of lung cancer screening. We viewed / discussed a power point together that explained in detail the above noted topics. We paused at intervals to allow for questions to be asked and answered to ensure understanding.We discussed that the single most powerful action that she can take to decrease her risk of developing lung cancer is to quit smoking. We discussed whether or not she is ready to commit to setting a quit date. We discussed options for tools to aid in quitting smoking including nicotine replacement therapy, non-nicotine medications, support groups, Quit Smart classes, and behavior modification. We discussed that often times setting smaller, more achievable goals, such as eliminating 1 cigarette a day for a week and then 2 cigarettes a day for a week can be helpful in slowly decreasing the number of cigarettes smoked. This allows for a sense of accomplishment as well as providing a clinical benefit. I provided  her  with smoking cessation  information  with contact information for community resources, classes, free nicotine replacement therapy, and access to mobile apps,  text messaging,  and on-line smoking cessation help. I have also provided her  the office contact information in the event she needs to contact me, or the screening staff. We discussed the time and location of the scan, and that either Doroteo Glassman RN, Joella Prince, RN  or I will call / send a letter with the results within 24-72 hours of receiving them. The patient verbalized understanding of all of  the above and had no further questions upon leaving the office. They have my contact information in the event they have any further questions.  I spent 3-5 minutes counseling on smoking cessation and the health risks of continued tobacco abuse.  I explained to the patient that there has been a high incidence of coronary artery disease noted on these exams. I explained that this is a non-gated exam therefore degree or severity cannot be determined. This patient is on statin therapy. I have asked the patient to follow-up with their PCP regarding any incidental finding of coronary artery disease and management with diet or medication as their PCP  feels is clinically indicated. The patient verbalized understanding of the above and had no further questions upon completion of the visit.    Martyn Ehrich, NP

## 2022-10-24 NOTE — Patient Instructions (Signed)
Thank you for participating in the [facility] Scre[facility]. It was our pleasure to meet you today. We will call you with the results of your scan within the next few days. Your scan will be assigned a Lung RADS category score by the physicians reading the scans.  This Lung RADS score determines follow up scanning.  See below for description of categories, and follow up screening recommendations. We will be in touch to schedule your follow up screening annually or based on recommendations of our providers. We will fax a copy of your scan results to your Primary Care Physician, or the physician who referred you to the program, to ensure they have the results. Please call the office if you have any questions or concerns regarding your scanning experience or results.  Our office number is [phone]. Please speak with Denise Phelps, RN. , or  Denise Buckner RN, They are  our Lung Cancer Screening RN.'s If They are unavailable when you call, Please leave a message on the voice mail. We will return your call at our earliest convenience.This voice mail is monitored several times a day.  Remember, if your scan is normal, we will scan you annually as long as you continue to meet the criteria for the program. (Age 50-80, Current smoker or smoker who has quit within the last 15 years). If you are a smoker, remember, quitting is the single most powerful action that you can take to decrease your risk of lung cancer and other pulmonary, breathing related problems. We know quitting is hard, and we are here to help.  Please let us know if there is anything we can do to help you meet your goal of quitting. If you are a former smoker, congratulations. We are proud of you! Remain smoke free! Remember you can refer friends or family members through the number above.  We will screen them to make sure they meet criteria for the program. Thank you for helping us take better care of you by  participating in Lung Screening.  You can receive free nicotine replacement therapy ( patches, gum or mints) by calling 1-800-QUIT NOW. Please call so we can get you on the path to becoming  a non-smoker. I know it is hard, but you can do this!  Lung RADS Categories:  Lung RADS 1: no nodules or definitely non-concerning nodules.  Recommendation is for a repeat annual scan in 12 months.  Lung RADS 2:  nodules that are non-concerning in appearance and behavior with a very low likelihood of becoming an active cancer. Recommendation is for a repeat annual scan in 12 months.  Lung RADS 3: nodules that are probably non-concerning , includes nodules with a low likelihood of becoming an active cancer.  Recommendation is for a 6-month repeat screening scan. Often noted after an upper respiratory illness. We will be in touch to make sure you have no questions, and to schedule your 6-month scan.  Lung RADS 4 A: nodules with concerning findings, recommendation is most often for a follow up scan in 3 months or additional testing based on our provider's assessment of the scan. We will be in touch to make sure you have no questions and to schedule the recommended 3 month follow up scan.  Lung RADS 4 B:  indicates findings that are concerning. We will be in touch with you to schedule additional diagnostic testing based on our provider's  assessment of the scan.  Other options for assistance in smoking cessation (   As covered by your insurance benefits)  Hypnosis for smoking cessation  [facility] [employer] [phone]  Acupuncture for smoking cessation  [facility] [phone]   

## 2022-10-25 ENCOUNTER — Ambulatory Visit
Admission: RE | Admit: 2022-10-25 | Discharge: 2022-10-25 | Disposition: A | Discharge status: Home / Self Care | Payer: Medicare HMO | Source: Ambulatory Visit | Attending: Acute Care | Admitting: Acute Care

## 2022-10-25 ENCOUNTER — Encounter: Payer: Self-pay | Admitting: Primary Care

## 2022-10-25 ENCOUNTER — Ambulatory Visit (INDEPENDENT_AMBULATORY_CARE_PROVIDER_SITE_OTHER): Payer: Medicare HMO | Admitting: Primary Care

## 2022-10-25 VITALS — Ht 65.0 in | Wt 225.0 lb

## 2022-10-25 DIAGNOSIS — I2510 Atherosclerotic heart disease of native coronary artery without angina pectoris: Secondary | ICD-10-CM | POA: Diagnosis not present

## 2022-10-25 DIAGNOSIS — J432 Centrilobular emphysema: Secondary | ICD-10-CM | POA: Diagnosis not present

## 2022-10-25 DIAGNOSIS — Z87891 Personal history of nicotine dependence: Secondary | ICD-10-CM | POA: Diagnosis not present

## 2022-10-25 DIAGNOSIS — Z85820 Personal history of malignant melanoma of skin: Secondary | ICD-10-CM | POA: Diagnosis not present

## 2022-10-25 DIAGNOSIS — Z122 Encounter for screening for malignant neoplasm of respiratory organs: Secondary | ICD-10-CM

## 2022-10-27 ENCOUNTER — Other Ambulatory Visit: Payer: Self-pay

## 2022-10-27 DIAGNOSIS — Z122 Encounter for screening for malignant neoplasm of respiratory organs: Secondary | ICD-10-CM

## 2022-10-27 DIAGNOSIS — Z87891 Personal history of nicotine dependence: Secondary | ICD-10-CM

## 2022-11-02 ENCOUNTER — Ambulatory Visit
Admission: RE | Admit: 2022-11-02 | Discharge: 2022-11-02 | Disposition: A | Discharge status: Home / Self Care | Payer: Medicare HMO | Source: Ambulatory Visit | Attending: Obstetrics and Gynecology | Admitting: Obstetrics and Gynecology

## 2022-11-02 DIAGNOSIS — Z1231 Encounter for screening mammogram for malignant neoplasm of breast: Secondary | ICD-10-CM

## 2022-12-06 DIAGNOSIS — E7800 Pure hypercholesterolemia, unspecified: Secondary | ICD-10-CM | POA: Diagnosis not present

## 2022-12-06 DIAGNOSIS — R799 Abnormal finding of blood chemistry, unspecified: Secondary | ICD-10-CM | POA: Diagnosis not present

## 2022-12-06 DIAGNOSIS — J432 Centrilobular emphysema: Secondary | ICD-10-CM | POA: Diagnosis not present

## 2022-12-06 DIAGNOSIS — Z6839 Body mass index (BMI) 39.0-39.9, adult: Secondary | ICD-10-CM | POA: Diagnosis not present

## 2022-12-06 DIAGNOSIS — I10 Essential (primary) hypertension: Secondary | ICD-10-CM | POA: Diagnosis not present

## 2022-12-06 DIAGNOSIS — D72829 Elevated white blood cell count, unspecified: Secondary | ICD-10-CM | POA: Diagnosis not present

## 2022-12-06 DIAGNOSIS — R69 Illness, unspecified: Secondary | ICD-10-CM | POA: Diagnosis not present

## 2022-12-06 DIAGNOSIS — E039 Hypothyroidism, unspecified: Secondary | ICD-10-CM | POA: Diagnosis not present

## 2022-12-06 DIAGNOSIS — G4700 Insomnia, unspecified: Secondary | ICD-10-CM | POA: Diagnosis not present

## 2022-12-06 DIAGNOSIS — I700 Atherosclerosis of aorta: Secondary | ICD-10-CM | POA: Diagnosis not present

## 2022-12-06 DIAGNOSIS — E1165 Type 2 diabetes mellitus with hyperglycemia: Secondary | ICD-10-CM | POA: Diagnosis not present

## 2022-12-25 DIAGNOSIS — I10 Essential (primary) hypertension: Secondary | ICD-10-CM | POA: Diagnosis not present

## 2022-12-25 DIAGNOSIS — E7800 Pure hypercholesterolemia, unspecified: Secondary | ICD-10-CM | POA: Diagnosis not present

## 2022-12-25 DIAGNOSIS — E1169 Type 2 diabetes mellitus with other specified complication: Secondary | ICD-10-CM | POA: Diagnosis not present

## 2022-12-25 DIAGNOSIS — E039 Hypothyroidism, unspecified: Secondary | ICD-10-CM | POA: Diagnosis not present

## 2022-12-25 DIAGNOSIS — K219 Gastro-esophageal reflux disease without esophagitis: Secondary | ICD-10-CM | POA: Diagnosis not present

## 2022-12-27 ENCOUNTER — Telehealth: Payer: Self-pay | Admitting: Cardiovascular Disease

## 2022-12-27 DIAGNOSIS — I25119 Atherosclerotic heart disease of native coronary artery with unspecified angina pectoris: Secondary | ICD-10-CM

## 2022-12-27 NOTE — Telephone Encounter (Signed)
Patient stated her PCP wants her to have a heart scan and she is following-up to get one ordered.

## 2022-12-27 NOTE — Telephone Encounter (Signed)
Left detailed message as noted for patient (DPR), call back w/any questions  ECHO order entered

## 2022-12-27 NOTE — Telephone Encounter (Signed)
Pt states that her PCP said that he has a heart murmur and told her he wanted to have a "heart scan" she states that she does not know which scan it is but she will call PCP back and have him order what he would like to have done. She states that she is "feeling fine" at this time and will mychart message back with PCP answer. I did check Dr Hazle Coca last note and there was no murmur noted. "Heart: regular rate and rhythm, S1, S2 normal, no murmur, click, rub or gallop "

## 2022-12-28 DIAGNOSIS — M160 Bilateral primary osteoarthritis of hip: Secondary | ICD-10-CM | POA: Diagnosis not present

## 2022-12-28 DIAGNOSIS — M79641 Pain in right hand: Secondary | ICD-10-CM | POA: Diagnosis not present

## 2022-12-28 DIAGNOSIS — M25562 Pain in left knee: Secondary | ICD-10-CM | POA: Diagnosis not present

## 2023-01-05 DIAGNOSIS — M25561 Pain in right knee: Secondary | ICD-10-CM | POA: Diagnosis not present

## 2023-01-15 ENCOUNTER — Ambulatory Visit (HOSPITAL_COMMUNITY)
Admission: RE | Admit: 2023-01-15 | Discharge: 2023-01-15 | Disposition: A | Discharge status: Home / Self Care | Payer: Medicare HMO | Source: Ambulatory Visit | Attending: Cardiovascular Disease | Admitting: Cardiovascular Disease

## 2023-01-15 DIAGNOSIS — I25119 Atherosclerotic heart disease of native coronary artery with unspecified angina pectoris: Secondary | ICD-10-CM

## 2023-01-15 DIAGNOSIS — I2510 Atherosclerotic heart disease of native coronary artery without angina pectoris: Secondary | ICD-10-CM | POA: Insufficient documentation

## 2023-01-15 DIAGNOSIS — G4730 Sleep apnea, unspecified: Secondary | ICD-10-CM | POA: Diagnosis not present

## 2023-01-15 DIAGNOSIS — I517 Cardiomegaly: Secondary | ICD-10-CM | POA: Insufficient documentation

## 2023-01-15 DIAGNOSIS — I10 Essential (primary) hypertension: Secondary | ICD-10-CM | POA: Diagnosis not present

## 2023-01-15 DIAGNOSIS — E119 Type 2 diabetes mellitus without complications: Secondary | ICD-10-CM | POA: Diagnosis not present

## 2023-01-15 LAB — ECHOCARDIOGRAM COMPLETE
Area-P 1/2: 2.62 cm2
Calc EF: 68.8 %
MV VTI: 4.69 cm2
S' Lateral: 2.7 cm
Single Plane A2C EF: 68.8 %
Single Plane A4C EF: 65.5 %

## 2023-01-15 NOTE — Progress Notes (Signed)
  Echocardiogram 2D Echocardiogram has been performed.  [name] 01/15/2023, 11:56 AM

## 2023-02-13 DIAGNOSIS — E1165 Type 2 diabetes mellitus with hyperglycemia: Secondary | ICD-10-CM | POA: Diagnosis not present

## 2023-02-13 DIAGNOSIS — I25118 Atherosclerotic heart disease of native coronary artery with other forms of angina pectoris: Secondary | ICD-10-CM | POA: Diagnosis not present

## 2023-04-11 ENCOUNTER — Telehealth: Payer: Self-pay | Admitting: Cardiovascular Disease

## 2023-04-11 NOTE — Telephone Encounter (Signed)
*  STAT* If patient is at the pharmacy, call can be transferred to refill team.   1. Which medications need to be refilled? (please list name of each medication and dose if known) atorvastatin (LIPITOR) 80 MG tablet    2. Would you like to learn more about the convenience, safety, & potential cost savings by using the [facility]? No     3. Are you open to using the [facility] (Type [facility]. No).   4. Which pharmacy/location (including street and city if local pharmacy) is medication to be sent to?      [facility] - [place], [place] - [address] AT Portal to Registered [facility] Sites  5. Do they need a 30 day or 90 day supply? 90

## 2023-04-18 DIAGNOSIS — E7800 Pure hypercholesterolemia, unspecified: Secondary | ICD-10-CM | POA: Diagnosis not present

## 2023-04-18 DIAGNOSIS — E1165 Type 2 diabetes mellitus with hyperglycemia: Secondary | ICD-10-CM | POA: Diagnosis not present

## 2023-04-18 DIAGNOSIS — M5440 Lumbago with sciatica, unspecified side: Secondary | ICD-10-CM | POA: Diagnosis not present

## 2023-04-18 DIAGNOSIS — E1142 Type 2 diabetes mellitus with diabetic polyneuropathy: Secondary | ICD-10-CM | POA: Diagnosis not present

## 2023-05-01 DIAGNOSIS — E039 Hypothyroidism, unspecified: Secondary | ICD-10-CM | POA: Diagnosis not present

## 2023-05-01 DIAGNOSIS — E1165 Type 2 diabetes mellitus with hyperglycemia: Secondary | ICD-10-CM | POA: Diagnosis not present

## 2023-05-03 ENCOUNTER — Telehealth: Payer: Self-pay | Admitting: Cardiovascular Disease

## 2023-05-03 MED ORDER — ISOSORBIDE MONONITRATE ER 30 MG PO TB24: 30.0000 mg | ORAL_TABLET | Freq: Every day | ORAL | 3 refills | Status: DC

## 2023-05-03 MED ORDER — ATORVASTATIN CALCIUM 80 MG PO TABS: 80.0000 mg | ORAL_TABLET | Freq: Every day | ORAL | 3 refills | Status: DC

## 2023-05-03 NOTE — Telephone Encounter (Signed)
*  STAT* If patient is at the pharmacy, call can be transferred to refill team.   1. Which medications need to be refilled? (please list name of each medication and dose if known)    atorvastatin (LIPITOR) 80 MG tablet   isosorbide mononitrate (IMDUR) 30 MG 24 hr tablet      4. Which pharmacy/location (including street and city if local pharmacy) is medication to be sent to? [facility] - [place] - [address] AT PORTAL TO REGISTERED [facility] SITES     5. Do they need a 30 day or 90 day supply? 90

## 2023-05-10 DIAGNOSIS — M545 Low back pain, unspecified: Secondary | ICD-10-CM | POA: Diagnosis not present

## 2023-05-10 DIAGNOSIS — M791 Myalgia, unspecified site: Secondary | ICD-10-CM | POA: Diagnosis not present

## 2023-05-10 DIAGNOSIS — M170 Bilateral primary osteoarthritis of knee: Secondary | ICD-10-CM | POA: Diagnosis not present

## 2023-05-10 DIAGNOSIS — M160 Bilateral primary osteoarthritis of hip: Secondary | ICD-10-CM | POA: Diagnosis not present

## 2023-05-30 ENCOUNTER — Encounter: Payer: Self-pay | Admitting: Cardiovascular Disease

## 2023-05-30 ENCOUNTER — Ambulatory Visit: Payer: Medicare HMO | Attending: Cardiovascular Disease | Admitting: Cardiovascular Disease

## 2023-05-30 VITALS — BP 112/60 | HR 81 | Ht 65.0 in | Wt 216.2 lb

## 2023-05-30 DIAGNOSIS — F17200 Nicotine dependence, unspecified, uncomplicated: Secondary | ICD-10-CM

## 2023-05-30 DIAGNOSIS — I10 Essential (primary) hypertension: Secondary | ICD-10-CM

## 2023-05-30 DIAGNOSIS — I25119 Atherosclerotic heart disease of native coronary artery with unspecified angina pectoris: Secondary | ICD-10-CM | POA: Diagnosis not present

## 2023-05-30 DIAGNOSIS — E7800 Pure hypercholesterolemia, unspecified: Secondary | ICD-10-CM | POA: Diagnosis not present

## 2023-05-30 NOTE — Patient Instructions (Signed)
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
## 2023-05-30 NOTE — Assessment & Plan Note (Signed)
History of hyperlipidemia on statin therapy with lipid profile performed 12/06/2022 revealing total cholesterol 150, LDL 67 and HDL of 35.

## 2023-05-30 NOTE — Assessment & Plan Note (Signed)
History of essential hypertension currently on no antihypertensive medications with blood pressure measured today at 112/60.  She does complain of occasional lightheaded headedness which may be orthostasis.

## 2023-05-30 NOTE — Assessment & Plan Note (Signed)
History of CAD status post CTA performed 10/18/2019 which showed a coronary calcium score of 813 mL disease by FFR analysis.  Based on this I performed radial diagnostic cath on her 11/03/2019 revealing moderate disease in the mid and distal LAD, small distal obtuse marginal branch and nondominant RCA with normally function.  Medical therapy is recommended.  She is on long-acting oral nitrate.  She has occasional chest pain which has not changed in frequency or severity.

## 2023-05-30 NOTE — Assessment & Plan Note (Signed)
History of tobacco abuse discontinued.

## 2023-08-23 DIAGNOSIS — M1712 Unilateral primary osteoarthritis, left knee: Secondary | ICD-10-CM | POA: Diagnosis not present

## 2023-08-23 DIAGNOSIS — M1711 Unilateral primary osteoarthritis, right knee: Secondary | ICD-10-CM | POA: Diagnosis not present

## 2023-08-23 DIAGNOSIS — M170 Bilateral primary osteoarthritis of knee: Secondary | ICD-10-CM | POA: Diagnosis not present

## 2023-08-30 DIAGNOSIS — E1165 Type 2 diabetes mellitus with hyperglycemia: Secondary | ICD-10-CM | POA: Diagnosis not present

## 2023-08-30 DIAGNOSIS — E039 Hypothyroidism, unspecified: Secondary | ICD-10-CM | POA: Diagnosis not present

## 2023-09-04 DIAGNOSIS — E119 Type 2 diabetes mellitus without complications: Secondary | ICD-10-CM | POA: Diagnosis not present

## 2023-09-04 DIAGNOSIS — H31002 Unspecified chorioretinal scars, left eye: Secondary | ICD-10-CM | POA: Diagnosis not present

## 2023-09-06 DIAGNOSIS — Z01411 Encounter for gynecological examination (general) (routine) with abnormal findings: Secondary | ICD-10-CM | POA: Diagnosis not present

## 2023-09-06 DIAGNOSIS — R32 Unspecified urinary incontinence: Secondary | ICD-10-CM | POA: Diagnosis not present

## 2023-09-06 DIAGNOSIS — Z1339 Encounter for screening examination for other mental health and behavioral disorders: Secondary | ICD-10-CM | POA: Diagnosis not present

## 2023-09-06 DIAGNOSIS — Z124 Encounter for screening for malignant neoplasm of cervix: Secondary | ICD-10-CM | POA: Diagnosis not present

## 2023-09-06 DIAGNOSIS — Z1239 Encounter for other screening for malignant neoplasm of breast: Secondary | ICD-10-CM | POA: Diagnosis not present

## 2023-09-06 DIAGNOSIS — M8580 Other specified disorders of bone density and structure, unspecified site: Secondary | ICD-10-CM | POA: Diagnosis not present

## 2023-09-06 DIAGNOSIS — Z1211 Encounter for screening for malignant neoplasm of colon: Secondary | ICD-10-CM | POA: Diagnosis not present

## 2023-09-07 DIAGNOSIS — I10 Essential (primary) hypertension: Secondary | ICD-10-CM | POA: Diagnosis not present

## 2023-09-07 DIAGNOSIS — E039 Hypothyroidism, unspecified: Secondary | ICD-10-CM | POA: Diagnosis not present

## 2023-09-07 DIAGNOSIS — E1165 Type 2 diabetes mellitus with hyperglycemia: Secondary | ICD-10-CM | POA: Diagnosis not present

## 2023-09-16 ENCOUNTER — Other Ambulatory Visit: Payer: Self-pay | Admitting: Acute Care

## 2023-09-16 DIAGNOSIS — Z122 Encounter for screening for malignant neoplasm of respiratory organs: Secondary | ICD-10-CM

## 2023-09-16 DIAGNOSIS — Z87891 Personal history of nicotine dependence: Secondary | ICD-10-CM

## 2023-10-17 ENCOUNTER — Telehealth: Payer: Self-pay | Admitting: Primary Care

## 2023-10-17 ENCOUNTER — Other Ambulatory Visit: Payer: Self-pay | Admitting: Family Medicine

## 2023-10-17 DIAGNOSIS — Z1231 Encounter for screening mammogram for malignant neoplasm of breast: Secondary | ICD-10-CM

## 2023-10-17 NOTE — Telephone Encounter (Signed)
Patient has Ct scan scheduled for February 10,2025 but needs it to be rescheduled since she will be out of town [phone]

## 2023-10-19 NOTE — Telephone Encounter (Signed)
Lmam for patient to resc appt

## 2023-10-22 ENCOUNTER — Telehealth: Payer: Self-pay | Admitting: Primary Care

## 2023-10-22 NOTE — Telephone Encounter (Signed)
PT called answering service to cancel CT on 2/10. Pls call to resched. TY.

## 2023-10-25 NOTE — Telephone Encounter (Signed)
 NFN

## 2023-10-29 ENCOUNTER — Other Ambulatory Visit: Payer: Medicare HMO

## 2023-11-06 ENCOUNTER — Ambulatory Visit
Admission: RE | Admit: 2023-11-06 | Discharge: 2023-11-06 | Disposition: A | Discharge status: Home / Self Care | Payer: Medicare HMO | Source: Ambulatory Visit | Attending: Acute Care | Admitting: Acute Care

## 2023-11-06 DIAGNOSIS — Z87891 Personal history of nicotine dependence: Secondary | ICD-10-CM | POA: Diagnosis not present

## 2023-11-06 DIAGNOSIS — Z122 Encounter for screening for malignant neoplasm of respiratory organs: Secondary | ICD-10-CM

## 2023-11-08 ENCOUNTER — Ambulatory Visit: Payer: Medicare HMO

## 2023-11-16 ENCOUNTER — Ambulatory Visit
Admission: RE | Admit: 2023-11-16 | Discharge: 2023-11-16 | Disposition: A | Discharge status: Home / Self Care | Payer: Medicare HMO | Source: Ambulatory Visit | Attending: Family Medicine | Admitting: Family Medicine

## 2023-11-16 DIAGNOSIS — Z1231 Encounter for screening mammogram for malignant neoplasm of breast: Secondary | ICD-10-CM | POA: Diagnosis not present

## 2023-11-26 ENCOUNTER — Other Ambulatory Visit: Payer: Self-pay

## 2023-11-26 DIAGNOSIS — Z122 Encounter for screening for malignant neoplasm of respiratory organs: Secondary | ICD-10-CM

## 2023-11-26 DIAGNOSIS — Z87891 Personal history of nicotine dependence: Secondary | ICD-10-CM

## 2024-01-07 DIAGNOSIS — E1165 Type 2 diabetes mellitus with hyperglycemia: Secondary | ICD-10-CM | POA: Diagnosis not present

## 2024-01-07 DIAGNOSIS — E039 Hypothyroidism, unspecified: Secondary | ICD-10-CM | POA: Diagnosis not present

## 2024-01-14 DIAGNOSIS — I10 Essential (primary) hypertension: Secondary | ICD-10-CM | POA: Diagnosis not present

## 2024-01-14 DIAGNOSIS — E782 Mixed hyperlipidemia: Secondary | ICD-10-CM | POA: Diagnosis not present

## 2024-01-14 DIAGNOSIS — E039 Hypothyroidism, unspecified: Secondary | ICD-10-CM | POA: Diagnosis not present

## 2024-01-14 DIAGNOSIS — E1165 Type 2 diabetes mellitus with hyperglycemia: Secondary | ICD-10-CM | POA: Diagnosis not present

## 2024-01-22 DIAGNOSIS — F331 Major depressive disorder, recurrent, moderate: Secondary | ICD-10-CM | POA: Diagnosis not present

## 2024-01-22 DIAGNOSIS — E559 Vitamin D deficiency, unspecified: Secondary | ICD-10-CM | POA: Diagnosis not present

## 2024-01-22 DIAGNOSIS — E1165 Type 2 diabetes mellitus with hyperglycemia: Secondary | ICD-10-CM | POA: Diagnosis not present

## 2024-01-22 DIAGNOSIS — E039 Hypothyroidism, unspecified: Secondary | ICD-10-CM | POA: Diagnosis not present

## 2024-01-22 DIAGNOSIS — F419 Anxiety disorder, unspecified: Secondary | ICD-10-CM | POA: Diagnosis not present

## 2024-01-22 DIAGNOSIS — I2510 Atherosclerotic heart disease of native coronary artery without angina pectoris: Secondary | ICD-10-CM | POA: Diagnosis not present

## 2024-01-22 DIAGNOSIS — F422 Mixed obsessional thoughts and acts: Secondary | ICD-10-CM | POA: Diagnosis not present

## 2024-01-22 DIAGNOSIS — I700 Atherosclerosis of aorta: Secondary | ICD-10-CM | POA: Diagnosis not present

## 2024-01-22 DIAGNOSIS — I10 Essential (primary) hypertension: Secondary | ICD-10-CM | POA: Diagnosis not present

## 2024-01-22 DIAGNOSIS — E782 Mixed hyperlipidemia: Secondary | ICD-10-CM | POA: Diagnosis not present

## 2024-01-22 DIAGNOSIS — E1142 Type 2 diabetes mellitus with diabetic polyneuropathy: Secondary | ICD-10-CM | POA: Diagnosis not present

## 2024-01-22 DIAGNOSIS — J432 Centrilobular emphysema: Secondary | ICD-10-CM | POA: Diagnosis not present

## 2024-02-12 DIAGNOSIS — L738 Other specified follicular disorders: Secondary | ICD-10-CM | POA: Diagnosis not present

## 2024-02-12 DIAGNOSIS — L918 Other hypertrophic disorders of the skin: Secondary | ICD-10-CM | POA: Diagnosis not present

## 2024-02-12 DIAGNOSIS — D1801 Hemangioma of skin and subcutaneous tissue: Secondary | ICD-10-CM | POA: Diagnosis not present

## 2024-02-12 DIAGNOSIS — Z1283 Encounter for screening for malignant neoplasm of skin: Secondary | ICD-10-CM | POA: Diagnosis not present

## 2024-02-12 DIAGNOSIS — L821 Other seborrheic keratosis: Secondary | ICD-10-CM | POA: Diagnosis not present

## 2024-02-12 DIAGNOSIS — D2371 Other benign neoplasm of skin of right lower limb, including hip: Secondary | ICD-10-CM | POA: Diagnosis not present

## 2024-04-14 ENCOUNTER — Other Ambulatory Visit: Payer: Self-pay | Admitting: Cardiovascular Disease

## 2024-04-22 DIAGNOSIS — E559 Vitamin D deficiency, unspecified: Secondary | ICD-10-CM | POA: Diagnosis not present

## 2024-04-22 DIAGNOSIS — E782 Mixed hyperlipidemia: Secondary | ICD-10-CM | POA: Diagnosis not present

## 2024-04-22 DIAGNOSIS — E1165 Type 2 diabetes mellitus with hyperglycemia: Secondary | ICD-10-CM | POA: Diagnosis not present

## 2024-04-29 DIAGNOSIS — F331 Major depressive disorder, recurrent, moderate: Secondary | ICD-10-CM | POA: Diagnosis not present

## 2024-04-29 DIAGNOSIS — E039 Hypothyroidism, unspecified: Secondary | ICD-10-CM | POA: Diagnosis not present

## 2024-04-29 DIAGNOSIS — I700 Atherosclerosis of aorta: Secondary | ICD-10-CM | POA: Diagnosis not present

## 2024-04-29 DIAGNOSIS — Z0000 Encounter for general adult medical examination without abnormal findings: Secondary | ICD-10-CM | POA: Diagnosis not present

## 2024-04-29 DIAGNOSIS — E1165 Type 2 diabetes mellitus with hyperglycemia: Secondary | ICD-10-CM | POA: Diagnosis not present

## 2024-04-29 DIAGNOSIS — E1142 Type 2 diabetes mellitus with diabetic polyneuropathy: Secondary | ICD-10-CM | POA: Diagnosis not present

## 2024-04-29 DIAGNOSIS — E559 Vitamin D deficiency, unspecified: Secondary | ICD-10-CM | POA: Diagnosis not present

## 2024-04-29 DIAGNOSIS — E782 Mixed hyperlipidemia: Secondary | ICD-10-CM | POA: Diagnosis not present

## 2024-04-29 DIAGNOSIS — I10 Essential (primary) hypertension: Secondary | ICD-10-CM | POA: Diagnosis not present

## 2024-04-29 DIAGNOSIS — F422 Mixed obsessional thoughts and acts: Secondary | ICD-10-CM | POA: Diagnosis not present

## 2024-04-29 DIAGNOSIS — J432 Centrilobular emphysema: Secondary | ICD-10-CM | POA: Diagnosis not present

## 2024-04-29 DIAGNOSIS — I2510 Atherosclerotic heart disease of native coronary artery without angina pectoris: Secondary | ICD-10-CM | POA: Diagnosis not present

## 2024-05-01 ENCOUNTER — Telehealth: Payer: Self-pay | Admitting: Cardiovascular Disease

## 2024-05-01 NOTE — Telephone Encounter (Signed)
 Patient wants to get Dr. Ranee approval on participating in a research study. She says that it is in regards to her elevated triglycerides and cholesterol. She is not sure of the name of the study but will have the name of it ready whenever Dr. Ranee nurse gives her a call back.

## 2024-05-01 NOTE — Telephone Encounter (Signed)
 Called patient back about message. She will have name of study and doctor that is running study when Dr. Ranee nurse gets back. This study is being done at her PCP office for lipids, triglycerides, etc.

## 2024-05-02 ENCOUNTER — Encounter: Payer: Self-pay | Admitting: Cardiovascular Disease

## 2024-05-02 NOTE — Telephone Encounter (Signed)
 Patient called back to say the name of the study is [employer] phase 2. The Dr. Lynwood Ponciano Ryder the 3rd. Please advise

## 2024-05-07 NOTE — Telephone Encounter (Signed)
 Spoke with pt regarding study being offered through her PCP office to better treat cholesterol and elevated Triglycerides.  [employer] phase 2  Elevated triglycerides and elevated cholesterol  Randomized double blind study   The provider is Dr. Lynwood Ponciano Ryder.  Advised pt that Dr. Court has responded to another encounter, stating that he would defer participation to pt's primary care. Pt will move forward with the study and further discuss with Dr. Court at her annual office visit. Office visit scheduled for pt. Pt verbalizes understanding.

## 2024-05-13 DIAGNOSIS — M1711 Unilateral primary osteoarthritis, right knee: Secondary | ICD-10-CM | POA: Diagnosis not present

## 2024-05-13 DIAGNOSIS — M170 Bilateral primary osteoarthritis of knee: Secondary | ICD-10-CM | POA: Diagnosis not present

## 2024-05-13 DIAGNOSIS — M1712 Unilateral primary osteoarthritis, left knee: Secondary | ICD-10-CM | POA: Diagnosis not present

## 2024-05-15 DIAGNOSIS — E1165 Type 2 diabetes mellitus with hyperglycemia: Secondary | ICD-10-CM | POA: Diagnosis not present

## 2024-05-15 DIAGNOSIS — E039 Hypothyroidism, unspecified: Secondary | ICD-10-CM | POA: Diagnosis not present

## 2024-05-22 DIAGNOSIS — E1165 Type 2 diabetes mellitus with hyperglycemia: Secondary | ICD-10-CM | POA: Diagnosis not present

## 2024-05-22 DIAGNOSIS — I10 Essential (primary) hypertension: Secondary | ICD-10-CM | POA: Diagnosis not present

## 2024-05-22 DIAGNOSIS — E782 Mixed hyperlipidemia: Secondary | ICD-10-CM | POA: Diagnosis not present

## 2024-05-22 DIAGNOSIS — I2510 Atherosclerotic heart disease of native coronary artery without angina pectoris: Secondary | ICD-10-CM | POA: Diagnosis not present

## 2024-05-22 DIAGNOSIS — E039 Hypothyroidism, unspecified: Secondary | ICD-10-CM | POA: Diagnosis not present

## 2024-06-04 ENCOUNTER — Ambulatory Visit: Attending: Cardiology | Admitting: Cardiovascular Disease

## 2024-06-04 ENCOUNTER — Encounter: Payer: Self-pay | Admitting: Cardiovascular Disease

## 2024-06-04 VITALS — BP 102/60 | HR 75 | Ht 65.0 in | Wt 201.7 lb

## 2024-06-04 DIAGNOSIS — E7800 Pure hypercholesterolemia, unspecified: Secondary | ICD-10-CM | POA: Diagnosis not present

## 2024-06-04 DIAGNOSIS — F17200 Nicotine dependence, unspecified, uncomplicated: Secondary | ICD-10-CM | POA: Diagnosis not present

## 2024-06-04 DIAGNOSIS — I25119 Atherosclerotic heart disease of native coronary artery with unspecified angina pectoris: Secondary | ICD-10-CM

## 2024-06-04 DIAGNOSIS — I10 Essential (primary) hypertension: Secondary | ICD-10-CM | POA: Diagnosis not present

## 2024-06-04 NOTE — Patient Instructions (Signed)

## 2024-06-04 NOTE — Assessment & Plan Note (Signed)
 Discontinued

## 2024-06-04 NOTE — Assessment & Plan Note (Signed)
 History of hyperlipidemia on statin therapy with lipid profile performed 04/22/2024 revealing total cholesterol 137, LDL 62 and HDL 36.

## 2024-06-04 NOTE — Assessment & Plan Note (Signed)
 History of CAD by CTA performed 10/18/2019 with a count coronary calcium  score of 813 and three-vessel disease by FFR analysis.  I catheter on 11/03/2019 revealing moderate disease in the mid and distal LAD as well as small distal obtuse marginal branch with a nondominant right.  Medical therapy was recommended.  We could entertain LAD revascularization should she become recalcitrant to medical therapy.  She now gets occasional chest pain every 6 weeks or so that has not changed in frequency or severity.

## 2024-06-04 NOTE — Assessment & Plan Note (Signed)
 History of essential hypertension in the past with blood blood pressure measured today at 102/60.  She is not on any antihypertensive medications.  She does occasionally get lightheaded however.
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
## 2024-06-05 DIAGNOSIS — M7062 Trochanteric bursitis, left hip: Secondary | ICD-10-CM | POA: Diagnosis not present

## 2024-06-05 DIAGNOSIS — M7061 Trochanteric bursitis, right hip: Secondary | ICD-10-CM | POA: Diagnosis not present

## 2024-06-05 DIAGNOSIS — M160 Bilateral primary osteoarthritis of hip: Secondary | ICD-10-CM | POA: Diagnosis not present

## 2024-06-15 ENCOUNTER — Other Ambulatory Visit: Payer: Self-pay | Admitting: Cardiovascular Disease

## 2024-07-13 ENCOUNTER — Other Ambulatory Visit: Payer: Self-pay | Admitting: Cardiovascular Disease

## 2024-09-01 DIAGNOSIS — E1142 Type 2 diabetes mellitus with diabetic polyneuropathy: Secondary | ICD-10-CM | POA: Diagnosis not present

## 2024-09-01 DIAGNOSIS — J0100 Acute maxillary sinusitis, unspecified: Secondary | ICD-10-CM | POA: Diagnosis not present

## 2024-09-01 DIAGNOSIS — R051 Acute cough: Secondary | ICD-10-CM | POA: Diagnosis not present

## 2024-09-04 ENCOUNTER — Telehealth: Payer: Self-pay | Admitting: Cardiovascular Disease

## 2024-09-04 DIAGNOSIS — Z539 Procedure and treatment not carried out, unspecified reason: Secondary | ICD-10-CM

## 2024-09-04 NOTE — Telephone Encounter (Signed)
 Patient states that she is having a hard time getting her bp read. Calling to see what she would needs to do. Her PCP said it has to be exteremly low if she can t get a reading. Please advise

## 2024-09-04 NOTE — Telephone Encounter (Signed)
 Pt states she went to the dentist and they were unable to get her bp pressure on their machines, it kept saying error. They told her its probably because her blood pressure was too low. Pt went to PCP Katie to be seen and she said she thinks her BP was 100/40 there but isn't sure. Pt denies any dizziness, weakness, SOB, or chest pain. Daughter states that pt does get light headed after bending over and standing back up at times. Instructed patient to start taking BP at home. Take it in the morning first thing and two hours after taking her Isosorbide , and at bedtime. ER precautions given. Informed patient I would send over to Dr Court for further advisement.

## 2024-09-05 DIAGNOSIS — H31002 Unspecified chorioretinal scars, left eye: Secondary | ICD-10-CM | POA: Diagnosis not present

## 2024-09-05 DIAGNOSIS — E119 Type 2 diabetes mellitus without complications: Secondary | ICD-10-CM | POA: Diagnosis not present

## 2024-09-05 NOTE — Telephone Encounter (Signed)
 Referral placed to pharmacy per Dr. Court. Will route to Pharm D.

## 2024-10-31 ENCOUNTER — Ambulatory Visit: Admitting: Pharmacist Clinician (PhC)/ Clinical Pharmacy Specialist

## 2024-11-06 ENCOUNTER — Other Ambulatory Visit
